# Patient Record
Sex: Female | Born: 1935 | Race: White | Hispanic: No | Marital: Married | Smoking: Former smoker
Health system: Southern US, Community
[De-identification: ages and names within clinical notes are randomized; demographics above are authoritative.]

## PROBLEM LIST (undated history)

## (undated) DIAGNOSIS — K52832 Lymphocytic colitis: Secondary | ICD-10-CM

## (undated) DIAGNOSIS — I341 Nonrheumatic mitral (valve) prolapse: Secondary | ICD-10-CM

## (undated) DIAGNOSIS — E785 Hyperlipidemia, unspecified: Secondary | ICD-10-CM

## (undated) DIAGNOSIS — M810 Age-related osteoporosis without current pathological fracture: Secondary | ICD-10-CM

## (undated) DIAGNOSIS — I10 Essential (primary) hypertension: Secondary | ICD-10-CM

## (undated) DIAGNOSIS — K5792 Diverticulitis of intestine, part unspecified, without perforation or abscess without bleeding: Secondary | ICD-10-CM

## (undated) DIAGNOSIS — K219 Gastro-esophageal reflux disease without esophagitis: Secondary | ICD-10-CM

## (undated) DIAGNOSIS — IMO0001 Reserved for inherently not codable concepts without codable children: Secondary | ICD-10-CM

## (undated) DIAGNOSIS — K449 Diaphragmatic hernia without obstruction or gangrene: Secondary | ICD-10-CM

## (undated) HISTORY — PX: OTHER SURGICAL HISTORY: SHX169

## (undated) HISTORY — PX: APPENDECTOMY: SHX54

## (undated) HISTORY — PX: CHOLECYSTECTOMY: SHX55

## (undated) HISTORY — DX: Age-related osteoporosis without current pathological fracture: M81.0

## (undated) HISTORY — DX: Diverticulitis of intestine, part unspecified, without perforation or abscess without bleeding: K57.92

## (undated) HISTORY — DX: Gastro-esophageal reflux disease without esophagitis: K21.9

## (undated) HISTORY — PX: CATARACT EXTRACTION: SUR2

## (undated) HISTORY — DX: Essential (primary) hypertension: I10

## (undated) HISTORY — DX: Lymphocytic colitis: K52.832

## (undated) HISTORY — PX: CORONARY STENT PLACEMENT: SHX1402

## (undated) HISTORY — DX: Reserved for inherently not codable concepts without codable children: IMO0001

## (undated) HISTORY — DX: Nonrheumatic mitral (valve) prolapse: I34.1

## (undated) HISTORY — DX: Hyperlipidemia, unspecified: E78.5

## (undated) HISTORY — PX: FOOT FRACTURE SURGERY: SHX645

## (undated) HISTORY — PX: CERVICAL DISC SURGERY: SHX588

## (undated) HISTORY — DX: Diaphragmatic hernia without obstruction or gangrene: K44.9

## (undated) HISTORY — PX: TUBAL LIGATION: SHX77

## (undated) HISTORY — PX: CARDIAC CATHETERIZATION: SHX172

## (undated) HISTORY — PX: TONSILLECTOMY: SUR1361

---

## 2004-03-23 ENCOUNTER — Ambulatory Visit: Payer: Self-pay | Admitting: Family Medicine

## 2005-03-28 ENCOUNTER — Ambulatory Visit: Payer: Self-pay | Admitting: Family Medicine

## 2005-04-05 ENCOUNTER — Ambulatory Visit: Payer: Self-pay | Admitting: Family Medicine

## 2005-08-13 ENCOUNTER — Emergency Department: Payer: Self-pay | Admitting: General Practice

## 2005-08-22 ENCOUNTER — Other Ambulatory Visit: Payer: Self-pay

## 2005-08-23 ENCOUNTER — Inpatient Hospital Stay: Payer: Self-pay | Admitting: Internal Medicine

## 2006-03-29 ENCOUNTER — Ambulatory Visit: Payer: Self-pay | Admitting: Family Medicine

## 2007-04-03 ENCOUNTER — Ambulatory Visit: Payer: Self-pay | Admitting: Family Medicine

## 2007-11-11 ENCOUNTER — Emergency Department: Payer: Self-pay | Admitting: Emergency Medicine

## 2007-11-11 ENCOUNTER — Other Ambulatory Visit: Payer: Self-pay

## 2007-11-14 ENCOUNTER — Ambulatory Visit: Payer: Self-pay | Admitting: Internal Medicine

## 2007-11-23 ENCOUNTER — Ambulatory Visit: Payer: Self-pay | Admitting: Family Medicine

## 2007-11-27 ENCOUNTER — Emergency Department: Payer: Self-pay | Admitting: Internal Medicine

## 2007-11-27 ENCOUNTER — Other Ambulatory Visit: Payer: Self-pay

## 2007-11-28 ENCOUNTER — Ambulatory Visit: Payer: Self-pay | Admitting: Internal Medicine

## 2008-04-03 ENCOUNTER — Ambulatory Visit: Payer: Self-pay | Admitting: Family Medicine

## 2009-04-06 ENCOUNTER — Ambulatory Visit: Payer: Self-pay | Admitting: Family Medicine

## 2009-10-24 ENCOUNTER — Emergency Department: Payer: Self-pay | Admitting: Emergency Medicine

## 2010-04-13 ENCOUNTER — Ambulatory Visit: Payer: Self-pay | Admitting: Family Medicine

## 2011-05-02 ENCOUNTER — Ambulatory Visit: Payer: Self-pay | Admitting: Family Medicine

## 2011-12-27 ENCOUNTER — Emergency Department: Payer: Self-pay | Admitting: Emergency Medicine

## 2011-12-27 LAB — URINALYSIS, COMPLETE
Glucose,UR: NEGATIVE mg/dL (ref 0–75)
Nitrite: NEGATIVE
Protein: NEGATIVE
RBC,UR: 4 /HPF (ref 0–5)
WBC UR: 13 /HPF (ref 0–5)

## 2011-12-27 LAB — COMPREHENSIVE METABOLIC PANEL
Albumin: 3.7 g/dL (ref 3.4–5.0)
Alkaline Phosphatase: 75 U/L (ref 50–136)
Anion Gap: 8 (ref 7–16)
BUN: 10 mg/dL (ref 7–18)
Calcium, Total: 9.2 mg/dL (ref 8.5–10.1)
Co2: 28 mmol/L (ref 21–32)
Creatinine: 0.93 mg/dL (ref 0.60–1.30)
EGFR (Non-African Amer.): >60
Glucose: 99 mg/dL (ref 65–99)
SGOT(AST): 30 U/L (ref 15–37)
SGPT (ALT): 29 U/L
Sodium: 141 mmol/L (ref 136–145)
Total Protein: 7.4 g/dL (ref 6.4–8.2)

## 2011-12-27 LAB — CBC
MCV: 98 fL (ref 80–100)
Platelet: 235 10*3/uL (ref 150–440)
RBC: 4.28 10*6/uL (ref 3.80–5.20)
WBC: 7.4 10*3/uL (ref 3.6–11.0)

## 2011-12-27 LAB — TROPONIN I: Troponin-I: <0.02 ng/mL

## 2011-12-29 LAB — URINE CULTURE

## 2012-02-12 ENCOUNTER — Inpatient Hospital Stay: Payer: Self-pay | Admitting: Internal Medicine

## 2012-02-12 LAB — CBC
HCT: 40.7 % (ref 35.0–47.0)
HGB: 13.7 g/dL (ref 12.0–16.0)
MCH: 32.1 pg (ref 26.0–34.0)
MCHC: 33.6 g/dL (ref 32.0–36.0)
MCV: 96 fL (ref 80–100)
Platelet: 266 10*3/uL (ref 150–440)
RBC: 4.26 10*6/uL (ref 3.80–5.20)

## 2012-02-12 LAB — BASIC METABOLIC PANEL
Calcium, Total: 9.7 mg/dL (ref 8.5–10.1)
Chloride: 104 mmol/L (ref 98–107)
EGFR (African American): >60
EGFR (Non-African Amer.): >60
Glucose: 122 mg/dL — ABNORMAL HIGH (ref 65–99)
Osmolality: 280 (ref 275–301)
Potassium: 3.9 mmol/L (ref 3.5–5.1)
Sodium: 139 mmol/L (ref 136–145)

## 2012-02-12 LAB — CK TOTAL AND CKMB (NOT AT ARMC)
CK, Total: 103 U/L (ref 21–215)
CK, Total: 85 U/L (ref 21–215)
CK-MB: 0.7 ng/mL (ref 0.5–3.6)
CK-MB: 1 ng/mL (ref 0.5–3.6)

## 2012-02-12 LAB — PRO B NATRIURETIC PEPTIDE: B-Type Natriuretic Peptide: 100 pg/mL (ref 0–125)

## 2012-02-12 LAB — TROPONIN I: Troponin-I: <0.02 ng/mL

## 2012-02-13 LAB — CK TOTAL AND CKMB (NOT AT ARMC): CK, Total: 78 U/L (ref 21–215)

## 2012-02-13 LAB — LIPID PANEL
HDL Cholesterol: 56 mg/dL (ref 40–60)
Ldl Cholesterol, Calc: 190 mg/dL — ABNORMAL HIGH (ref 0–100)
Triglycerides: 114 mg/dL (ref 0–200)
VLDL Cholesterol, Calc: 23 mg/dL (ref 5–40)

## 2012-02-13 LAB — CBC WITH DIFFERENTIAL/PLATELET
Basophil #: 0.1 10*3/uL (ref 0.0–0.1)
Basophil %: 1.1 %
Eosinophil #: 0.1 10*3/uL (ref 0.0–0.7)
Lymphocyte %: 40.5 %
MCH: 31.9 pg (ref 26.0–34.0)
MCHC: 33.4 g/dL (ref 32.0–36.0)
MCV: 96 fL (ref 80–100)
Monocyte #: 0.6 x10 3/mm (ref 0.2–0.9)
Monocyte %: 7.4 %
Neutrophil #: 3.8 10*3/uL (ref 1.4–6.5)
Neutrophil %: 49.8 %
RBC: 4.23 10*6/uL (ref 3.80–5.20)
RDW: 13.8 % (ref 11.5–14.5)

## 2012-02-13 LAB — BASIC METABOLIC PANEL
Calcium, Total: 8.9 mg/dL (ref 8.5–10.1)
Chloride: 109 mmol/L — ABNORMAL HIGH (ref 98–107)
Co2: 25 mmol/L (ref 21–32)
EGFR (African American): >60
EGFR (Non-African Amer.): >60
Potassium: 4.2 mmol/L (ref 3.5–5.1)
Sodium: 141 mmol/L (ref 136–145)

## 2012-02-13 LAB — TROPONIN I: Troponin-I: <0.02 ng/mL

## 2012-02-14 LAB — BASIC METABOLIC PANEL
Anion Gap: 6 — ABNORMAL LOW (ref 7–16)
BUN: 13 mg/dL (ref 7–18)
Co2: 26 mmol/L (ref 21–32)
Creatinine: 0.79 mg/dL (ref 0.60–1.30)
EGFR (African American): >60
EGFR (Non-African Amer.): >60
Potassium: 3.8 mmol/L (ref 3.5–5.1)
Sodium: 142 mmol/L (ref 136–145)

## 2012-02-14 LAB — CK TOTAL AND CKMB (NOT AT ARMC)
CK, Total: 150 U/L (ref 21–215)
CK-MB: 7.4 ng/mL — ABNORMAL HIGH (ref 0.5–3.6)

## 2012-05-09 ENCOUNTER — Ambulatory Visit: Payer: Self-pay | Admitting: Family Medicine

## 2013-04-03 ENCOUNTER — Ambulatory Visit (INDEPENDENT_AMBULATORY_CARE_PROVIDER_SITE_OTHER): Payer: Medicare Other | Admitting: Podiatry

## 2013-04-03 ENCOUNTER — Ambulatory Visit (INDEPENDENT_AMBULATORY_CARE_PROVIDER_SITE_OTHER): Payer: Medicare Other

## 2013-04-03 ENCOUNTER — Encounter: Payer: Self-pay | Admitting: Podiatry

## 2013-04-03 VITALS — BP 166/83 | HR 65 | Resp 16 | Ht 63.0 in | Wt 165.0 lb

## 2013-04-03 DIAGNOSIS — IMO0001 Reserved for inherently not codable concepts without codable children: Secondary | ICD-10-CM

## 2013-04-03 DIAGNOSIS — S92212D Displaced fracture of cuboid bone of left foot, subsequent encounter for fracture with routine healing: Secondary | ICD-10-CM

## 2013-04-03 NOTE — Progress Notes (Signed)
Jane [name] presents today for followup of her fractured cuboid left foot. She states that she wore her regular shoe because her foot feels a much better.  Objective: Vital signs are stable she is alert and oriented x3. She has no pain on palpation of the cuboid bone. Radiographic evaluation demonstrates a well-healed fractured cuboid.  Assessment: Well-healed cuboid fracture.  Plan: Followup with me on an as-needed basis.

## 2013-04-09 ENCOUNTER — Encounter: Payer: Self-pay | Admitting: Podiatry

## 2013-04-09 ENCOUNTER — Encounter: Payer: Self-pay | Admitting: *Deleted

## 2013-04-09 ENCOUNTER — Telehealth: Payer: Self-pay | Admitting: *Deleted

## 2013-04-09 NOTE — Telephone Encounter (Signed)
Corrections made to pt's medication and surgery history.

## 2013-04-11 ENCOUNTER — Other Ambulatory Visit: Payer: Self-pay

## 2013-05-10 ENCOUNTER — Ambulatory Visit: Payer: Self-pay | Admitting: Family Medicine

## 2013-05-23 ENCOUNTER — Ambulatory Visit: Payer: Self-pay | Admitting: Family Medicine

## 2014-05-12 ENCOUNTER — Ambulatory Visit: Payer: Self-pay | Admitting: Family Medicine

## 2014-06-12 ENCOUNTER — Ambulatory Visit: Payer: Self-pay | Admitting: Internal Medicine

## 2014-09-23 NOTE — Discharge Summary (Signed)
PATIENT NAME:  Jane [name], Jane [name] MR#:  [id number] DATE OF BIRTH:  Oct 26, 1935  DATE OF ADMISSION:  02/12/2012 DATE OF DISCHARGE:  02/14/2012  ADDENDUM:  I spoke with the patient about cholesterol medications. She said she tried all the statins on alternate day regimen and also Colestid regimen but none of the statins suited her and she said she will not take any statins and she will continue only Colestid and also Fish Oil. I've advised her just to continue those two.   Please discontinue the statins on previous discharge summary.   ____________________________ Katha HammingSnehalatha Patsye Sullivant, MD sk:drc D: 02/14/2012 09:15:06 ET T: 02/14/2012 14:27:18 ET JOB#: [id number]  cc: Katha HammingSnehalatha Buffi Ewton, MD, <Dictator> Katha HammingSNEHALATHA Florida Nolton MD ELECTRONICALLY SIGNED 02/21/2012 21:50

## 2014-09-23 NOTE — Consult Note (Signed)
PATIENT NAME:  Jane [name], Jane [name] MR#:  [id number] DATE OF BIRTH:  08-14-1935  DATE OF CONSULTATION:  02/13/2012  REFERRING PHYSICIAN:  Dr. Cyril LoosenKinner and Dr. Terance HartBronstein CONSULTING PHYSICIAN:  Tanaisha Pittman D. Juliann Paresallwood, MD  PRIMARY CARDIOLOGIST: Dr. Lenord CarboKristin Newby, [facility].   INDICATION: Unstable angina.   HISTORY OF PRESENT ILLNESS:  Ms. Jane [name] is [name] 79 year old white female with history of known coronary disease, mild to moderate, hypertension, hyperlipidemia, obesity, strong family history who states she has recently had symptoms of chest pain and angina. She has had trouble with her blood pressure, has been feeling weak and tired. She has had symptoms of chest pain, shortness of breath even at rest. This has gotten progressively worse, so she finally came to the Emergency Room for evaluation. Again, she has had significant chest pressure and heaviness, which has gotten progressively worse.   REVIEW OF SYSTEMS: No blackout spells or syncope. No nausea, vomiting, fever or chills. No weight gain. No hemoptysis or hematemesis. No bright red blood per rectum. No vision change or hearing changes. No significant sputum production or cough   PAST MEDICAL HISTORY:  1. Coronary artery disease.  2. Mitral valve prolapse.   3. Osteoarthritis.  4. Hiatal hernia.  5. Diverticulosis.  6. Hyperlipidemia.  7. Lymphocytic colitis.  8. Osteoporosis.  9. Degenerative joint disease.  10. Benign hypertension.   PAST SURGICAL HISTORY:  1. Appendectomy.  2. Cholecystectomy.    MEDICATIONS:  1. Vitamin D. 2. Quinapril 40. 3. Lexapro 10.  4. Prilosec 20. 5. Fish oil. 6. HCTZ 12.5 daily.  7. Endocet 10/650 q.6h. p.r.n.  8. Colestipol 1 gram b.i.d.  9. Atenolol 50 [name] day.  10. Multivitamin. 11. Aspirin 81 mg [name] day.  12. Xanax 0.25 q.12 hours p.r.n.   ALLERGIES: Hydrocodone.   SOCIAL HISTORY: Married, retired. No smoking or alcohol consumption.   FAMILY HISTORY: Diabetes, hypertension, coronary artery disease.    PHYSICAL EXAMINATION:  VITAL SIGNS: Blood pressure 190/80, pulse 70 and regular, respiratory rate 18, afebrile.   HEENT: Normocephalic, atraumatic. Pupils equal and reactive to light.   NECK: Supple. No significant jugular venous distention, bruit or adenopathy.   LUNGS: Clear to auscultation and percussion.   HEART: Positive S4.   ABDOMEN: Positive bowel sounds. No rebound, guarding, or tenderness.   EXTREMITIES: Within normal limits.   NEUROLOGIC: Intact.   SKIN: Normal.   LABORATORY, RADIOLOGICAL AND DIAGNOSTIC DATA: Troponin 0.02. CK 105, glucose 122, BUN 16, creatinine 0.91, sodium 139, potassium 3.9, hemoglobin 13.7, BUN 100. EKG: Normal sinus rhythm, nonspecific ST-T wave changes. CT of the chest was unremarkable.   ASSESSMENT:  1. Unstable angina.  2. Angina.  3. Chest pain.  4. Coronary artery disease.  5. Hypertension.  6. Hyperlipidemia.  7. Hyperglycemia.  8. Obesity.   PLAN:  1. Agree with admit.  2. Rule out for myocardial infarction.  3. Agree with telemetry.  4. Continue blood pressure control. 5. Recommended antianginal control.  6. Follow-up cardiac enzymes.  7. We will probably proceed directly with cardiac catheterization for full evaluation of cardiac anatomy. I am concerned that her symptoms are more worrisome for unstable angina.  8. Follow-up labs in the morning.  9. Consider nitroglycerin.  10. Consider beta blockers in the interim as well as ACE inhibitors for blood pressure control. Follow-up lipid evaluation.  11. Base further evaluation on the results of the catheterization.  12. Echocardiogram will be helpful as well.  ____________________________ Bobbie Stackwayne D. Juliann Paresallwood, MD ddc:ap D: 02/13/2012 20:44:34 ET T:  02/14/2012 09:41:16 ET JOB#: [id number] cc: Shannen Vernon D. Juliann Pares, MD, <Dictator> Alwyn Pea MD ELECTRONICALLY SIGNED 03/19/2012 12:58

## 2014-09-23 NOTE — Consult Note (Signed)
Brief Consult Note: Diagnosis: [place].   Recommend further assessment or treatment.   Orders entered.   Discussed with Attending MD.   Comments: IMP [place] Angina CP HTN . Plan Rec cardiac cath 02/13/12.  Electronic Signatures: Dorothyann Pengallwood, Riccardo Holeman D (MD)  (Signed 09-Sep-13 06:35)  Authored: Brief Consult Note   Last Updated: 09-Sep-13 06:35 by Alwyn Peaallwood, Khaliya Golinski D (MD)

## 2014-09-23 NOTE — Discharge Summary (Signed)
PATIENT NAME:  Jane [name], Jane [name] MR#:  [id number] DATE OF BIRTH:  11/19/1935  DATE OF ADMISSION:  02/12/2012 DATE OF DISCHARGE:  02/14/2012  PRIMARY CARE PHYSICIAN: Jane Basemanavid Bronstein, MD  CONSULTANT: Jane Pengwayne Callwood, MD - Cardiology.  DISCHARGE DIAGNOSES:  1. Chest pain secondary to unstable angina and also had intervention to the distal RCA with stenting.  2. Hypertension.  3. Hyperlipidemia.  4. Chronic lymphocytic colitis.   DISCHARGE MEDICATIONS:  1. Aspirin 81 mg daily.  2. Colestid 1 gram p.o. 6 tablets twice [name] day. 3. Fish oil one capsule p.o. twice [name] day. 4. Lexapro 10 mg daily.  5. Omeprazole 20 mg p.o. daily. 6. Vitamin D 1000 international units 2 tablets once [name] day. 7. Centrum Silver 1 tablet daily.  8. Tums four tablets once [name] day. 9. Endocet 10/650 mg 1/2 to 1 tablet every six hours as needed.  10. Xanax 0.5 mg every 12 hours. 11. Atenolol 12.5 mg once [name] day. The dose of atenolol has been decreased from 50 mg to 12.5 mg because of low blood pressure. 12. Quinapril - decreased from 20 mg twice daily to 10 mg daily because of low blood pressure.   NEW MEDICATIONS: 1. Plavix 75 mg daily. 2. Nitroglycerin sublingual as needed for chest pain.  3. Simvastatin 40 mg daily.   NOTE: The patient is advised to stop HCTZ because blood pressure is on the low side.   DISCHARGE CONDITION: Stable.   DIET: Low sodium, low fat diet.   CONSULTANTS: Jane Pengwayne Callwood, MD - Cardiology.  PROCEDURES:  1. Angiogram. 2. Cardiac catheterization.   HOSPITAL COURSE: The patient is [name] 79 year old female patient admitted on 02/12/2012 because of chest pain. The patient has family history of coronary artery disease. She came in with chest pain, heaviness, trouble breathing, nausea, and diaphoresis. The patient's pain radiated to the jaw as well. She got an EKG which was unremarkable. Troponins have been negative. The patient has multiple risk factors of family history of heart disease,  hypertension, and hyperlipidemia. The patient was admitted to telemetry for possible acute coronary syndrome. Troponins have been negative. She was seen by Dr. Juliann [name]. She was continued on aspirin, beta blockers, statins, and nitrates. The patient's troponins have been consistently negative x3. Because of concern for unstable angina, she was taken to cardiac catheterization which showed 95% stenosis in the distal RCA. The patient had [name] stent. She was monitored in the Critical Care Unit briefly and got some fluids and continued on aspirin and beta blockers along with Plavix and ACE inhibitors. Statin were started because her LDL is more than 100 (190) and her cholesterol is 269. The patient right now is seen in the Intensive Care Unit. Blood pressure is running low this morning at 95/69. The patient's atenolol dose and lisinopril dose are decreased and she is advised to stop HCTZ. We will monitor her for some more time until blood pressure comes up. Heart rate is also around 50 so I will decrease the dose of beta blockers. I spoke with Dr. Juliann [name] who said she can go off as she is better in terms of her blood pressure and new prescriptions for Plavix, aspirin, beta blockers, statins, ACE inhibitors, and nitrates were submitted to her pharmacy. The patient's WBC showed [name] hemoglobin of 13.3, hematocrit 40.4, and platelets are 234. Electrolytes are stable. Creatinine is 0.84. LDL is 190, cholesterol 216, and  triglycerides are 114. The patient's hemoglobin A1c is 5.7. The patient also had [name] CT of the  chest         in the ER because of chest pain or trouble breathing. It did not show any PE. Her condition is stable.   TIME SPENT ON DISCHARGE PREPARATION:  More than 30 minutes.  ____________________________ Jane Hamming, MD sk:slb D: 02/14/2012 09:09:24 ET T: 02/14/2012 14:22:03 ET JOB#: [id number]  cc: Jane Hamming, MD, <Dictator> Jane [name]. Jane Hart, MD Jane Hamming  MD ELECTRONICALLY SIGNED 02/21/2012 21:50

## 2014-09-23 NOTE — H&P (Signed)
PATIENT NAME:  Jane [name], Jane [name] MR#:  [id number] DATE OF BIRTH:  Jun 01, 1936  DATE OF ADMISSION:  02/12/2012  REFERRING PHYSICIAN: Dr. Cyril Loosen    FAMILY PHYSICIAN: Dr. Terance Hart   REASON FOR ADMISSION: Chest pain worrisome for unstable angina.   HISTORY OF PRESENT ILLNESS: The patient is a 79 year old female with a history of insignificant coronary artery disease by cardiac catheterization as well as hypertension, hyperlipidemia, and a positive family history of coronary artery disease who presents to the Emergency Room after a week history of intermittent chest pain described as heaviness and pressure radiating to the jaw associated with shortness of breath, nausea, and diaphoresis. Her symptoms are happening at rest. They last 30 to 40 minutes. Not relieved by food. Her pressure and tightness radiates to the jaw. She does have associated shortness of breath, nausea, and diaphoresis. She rates it an 8 out of 10 pain. She was brought to the Emergency Room today where her pain subsided after she was given morphine. EKG and initial enzymes were unremarkable. She is now admitted for further evaluation.   PAST MEDICAL HISTORY:  1. Insignificant coronary artery disease by cardiac catheterization in 2004.  2. Mitral valve prolapse.  3. Osteoarthritis. 4. Hiatal hernia.  5. Diverticulosis.  6. Hyperlipidemia.  7. History of lymphocytic colitis.  8. Osteoporosis.  9. Cervical degenerative disk disease.  10. Benign hypertension.  11. Status post cholecystectomy.  12. Status post appendectomy.   MEDICATIONS:  1. Vitamin D 2000 units p.o. daily.  2. Quinapril 40 mg p.o. daily.  3. Lexapro 10 mg p.o. daily.  4. Prilosec 20 mg p.o. daily. 5. Fish Oil 1 gram p.o. b.i.d.   6. Hydrochlorothiazide 12.5 mg p.o. daily.  7. Endocet 10/650 one p.o. q.6 hours p.r.n. pain.  8. Colestid 1 gram p.o. b.i.d.  9. Atenolol 50 mg p.o. daily.  10. Multivitamin 1 p.o. daily.  11. Aspirin 81 mg p.o. daily.   12. Xanax 0.25 mg p.o. q.12 hours p.r.n. anxiety.   ALLERGIES: Hydrocodone.   SOCIAL HISTORY: Negative for alcohol or tobacco abuse.   FAMILY HISTORY: Positive for diabetes, hypertension, coronary artery disease, and stroke.   REVIEW OF SYSTEMS: CONSTITUTIONAL: No fever or change in weight. EYES: No blurred or double vision. No glaucoma. ENT: No tinnitus or hearing loss. No nasal discharge or bleeding. No difficulty swallowing. RESPIRATORY: No cough or wheezing. No painful respiration. Denies hemoptysis. CARDIOVASCULAR: No orthopnea. Denies palpitations or syncope. GI: No vomiting or diarrhea. No abdominal pain. No change in bowel habits. GU: No dysuria or hematuria. No incontinence. ENDOCRINE: No polyuria or polydipsia. No heat or cold intolerance. HEMATOLOGIC: The patient denies anemia, easy bruising, or bleeding. LYMPHATIC: No swollen glands. MUSCULOSKELETAL: The patient denies pain in her neck, back, shoulders, knees, or hips. No gout. NEUROLOGIC: No numbness. Does have weakness. Denies migraines, stroke, or seizures. PSYCH: The patient denies anxiety, insomnia, or depression.   PHYSICAL EXAMINATION:   GENERAL: The patient is in no acute distress.   VITAL SIGNS: Vital signs are currently remarkable for a blood pressure of 189/80 with a heart rate of 66 and a respiratory rate of 29. She is afebrile.   HEENT: Normocephalic, atraumatic. Pupils appear to be round and reactive to light and accommodation. Extraocular movements are intact. Sclerae are nonicteric. Conjunctivae are clear. Oropharynx is clear.   NECK: Supple without JVD or bruits. No adenopathy or thyromegaly is noted.   LUNGS: Clear to auscultation and percussion without wheezes, rales, or rhonchi. No dullness.  CARDIAC: Regular rate and rhythm with normal S1, S2. No significant rubs, murmurs, or gallops. PMI is nondisplaced. Chest wall is nontender.   ABDOMEN: Soft, nontender with normoactive bowel sounds. No organomegaly or  masses were appreciated. No hernias or bruits were noted.   EXTREMITIES: Without clubbing, cyanosis, or edema. Pulses were 2+ bilaterally.   SKIN: Warm and dry without rash or lesions.   NEUROLOGIC: Cranial nerves II through XII grossly intact. Deep tendon reflexes were symmetric. Motor and sensory exams nonfocal.   PSYCH: Alert and oriented to person, place, and time. She was cooperative and used good judgment.   LABORATORY, DIAGNOSTIC, AND RADIOLOGICAL DATA: Troponin was less than 0.02 with a CK of 103 and an MB of 0.7. Glucose was 122 with a BUN of 16 and a creatinine of 0.91 with a sodium of 139 and a potassium of 3.9 and a GFR of greater than 60. White count was 8.3 with a hemoglobin of 13.7. BNP was 100.   EKG revealed sinus rhythm with no acute ischemic changes.   CT scan of the chest was negative for pulmonary embolism, infiltrate, or edema.   ASSESSMENT:  1. Chest pain worrisome for unstable angina.  2. Insignificant coronary artery disease by previous cardiac catheterization.  3. Benign hypertension.  4. Hyperlipidemia.  5. Hyperglycemia.  6. Family history of coronary artery disease.   PLAN:  1. The patient will be observed on telemetry with Lovenox, topical nitrates, and continue beta-blocker therapy.  2. Will follow serial cardiac enzymes and obtain an echocardiogram.  3. Will consult Cardiology for possible cardiac catheterization given the fact that her symptoms have occurred at rest.  4. Follow-up routine labs in the morning.  5. Further treatment and evaluation will depend upon the patient's progress.   TOTAL TIME SPENT ON THIS PATIENT: 50 minutes.   ____________________________ Duane LopeJeffrey D. Judithann SheenSparks, MD jds:drc D: 02/12/2012 15:19:28 ET T: 02/12/2012 15:31:29 ET JOB#: [id number]  cc: Duane LopeJeffrey D. Judithann SheenSparks, MD, <Dictator> Teena Iraniavid M. Terance HartBronstein, MD Terease Marcotte Rodena Medin Latorya Bautch MD ELECTRONICALLY SIGNED 02/12/2012 16:42

## 2014-12-01 ENCOUNTER — Other Ambulatory Visit: Payer: Self-pay

## 2015-02-26 ENCOUNTER — Other Ambulatory Visit: Payer: Self-pay | Admitting: Family Medicine

## 2015-02-26 DIAGNOSIS — Z1231 Encounter for screening mammogram for malignant neoplasm of breast: Secondary | ICD-10-CM

## 2015-05-15 ENCOUNTER — Ambulatory Visit
Admission: RE | Admit: 2015-05-15 | Discharge: 2015-05-15 | Disposition: A | Discharge status: Home / Self Care | Payer: Medicare Other | Source: Ambulatory Visit | Attending: Family Medicine | Admitting: Family Medicine

## 2015-05-15 DIAGNOSIS — Z1231 Encounter for screening mammogram for malignant neoplasm of breast: Secondary | ICD-10-CM

## 2015-10-22 ENCOUNTER — Encounter: Payer: Self-pay | Admitting: Emergency Medicine

## 2015-10-22 ENCOUNTER — Ambulatory Visit
Admission: EM | Admit: 2015-10-22 | Discharge: 2015-10-22 | Disposition: A | Discharge status: Home / Self Care | Payer: Medicare Other | Attending: Emergency Medicine | Admitting: Emergency Medicine

## 2015-10-22 ENCOUNTER — Ambulatory Visit (INDEPENDENT_AMBULATORY_CARE_PROVIDER_SITE_OTHER): Payer: Medicare Other

## 2015-10-22 DIAGNOSIS — L539 Erythematous condition, unspecified: Secondary | ICD-10-CM

## 2015-10-22 DIAGNOSIS — M79644 Pain in right finger(s): Secondary | ICD-10-CM

## 2015-10-22 LAB — BASIC METABOLIC PANEL
ANION GAP: 5 (ref 5–15)
BUN: 23 mg/dL — ABNORMAL HIGH (ref 6–20)
CHLORIDE: 107 mmol/L (ref 101–111)
CO2: 26 mmol/L (ref 22–32)
CREATININE: 0.88 mg/dL (ref 0.44–1.00)
Calcium: 9.3 mg/dL (ref 8.9–10.3)
GFR calc non Af Amer: >60 mL/min (ref >60)
Glucose, Bld: 143 mg/dL — ABNORMAL HIGH (ref 65–99)
POTASSIUM: 4.3 mmol/L (ref 3.5–5.1)
SODIUM: 138 mmol/L (ref 135–145)

## 2015-10-22 LAB — CBC WITH DIFFERENTIAL/PLATELET
BASOS ABS: 0.1 10*3/uL (ref 0–0.1)
EOS ABS: 0.1 10*3/uL (ref 0–0.7)
Eosinophils Relative: 2 %
HEMATOCRIT: 39.8 % (ref 35.0–47.0)
Hemoglobin: 13.4 g/dL (ref 12.0–16.0)
Lymphocytes Relative: 22 %
Lymphs Abs: 1.8 10*3/uL (ref 1.0–3.6)
MCH: 32 pg (ref 26.0–34.0)
MCHC: 33.6 g/dL (ref 32.0–36.0)
MCV: 95.4 fL (ref 80.0–100.0)
Monocytes Absolute: 0.8 10*3/uL (ref 0.2–0.9)
Monocytes Relative: 10 %
NEUTROS ABS: 5.2 10*3/uL (ref 1.4–6.5)
Platelets: 220 10*3/uL (ref 150–440)
RBC: 4.17 MIL/uL (ref 3.80–5.20)
RDW: 13.4 % (ref 11.5–14.5)
WBC: 8 10*3/uL (ref 3.6–11.0)

## 2015-10-22 LAB — URIC ACID: Uric Acid, Serum: 4.1 mg/dL (ref 2.3–6.6)

## 2015-10-22 NOTE — Discharge Instructions (Signed)
Do not have anything to eat or drink until you are evaluated by Dr. Merlyn LotKuzma. Go to the day surgery Center at [address] [place] for evaluation by him.

## 2015-10-22 NOTE — ED Provider Notes (Signed)
HPI  SUBJECTIVE:  Jane [name] is a right handed 80 y.o. female who presents with the acute onset of atraumatic right index finger swelling last night. States that it became swollen over approximately half an hour. She describes erythema, constant, throbbing pain, states that the finger feels hot. She has tried ice and elevation with no relief. There are no alleviating factors. Symptoms are worse with palpation and any movement. She denies any trauma, bite, cut, burn, numbness, tingling, other injury to the hand. She has never had symptoms like this before. Past medical history of coronary artery disease status post stent, on Plavix. TIA, hypertension, hypercholesterolemia, diabetes that does not require medications, ex-smoker, osteoarthritis, mitral valve prolapse. No history of atrial fibrillation, arrhythmia, DVT or PE, doubt. PMD: Dr. Terance Hart.  Last meal at 0800 this morning. Had approximately 2 ounces of cereal and half a cup of coffee.  Past Medical History  Diagnosis Date  . Lymphocytic colitis   . Diverticulitis   . Hiatal hernia   . Reflux   . Hyperlipidemia   . Hypertension   . Osteoporosis   . Mitral valve prolapse     Past Surgical History  Procedure Laterality Date  . Tonsillectomy    . Appendectomy    . Tubal ligation    . Hemangioma    . Cholecystectomy    . Cardiac catheterization    . Cervical disc surgery    . Cataract extraction    . Foot fracture surgery    . Coronary stent placement      Family History  Problem Relation Age of Onset  . Breast cancer Paternal Aunt     Social History  Substance Use Topics  . Smoking status: Former Games developer  . Smokeless tobacco: None  . Alcohol Use: No    No current facility-administered medications for this encounter.  Current outpatient prescriptions:  .  ALPRAZolam (XANAX) 0.5 MG tablet, Take 0.5 mg by mouth at bedtime as needed for sleep., Disp: , Rfl:  .  aspirin 81 MG tablet, Take 81 mg by mouth daily.,  Disp: , Rfl:  .  atenolol (TENORMIN) 25 MG tablet, Take by mouth daily., Disp: , Rfl:  .  Calcium Carbonate Antacid (TUMS E-X PO), Take by mouth., Disp: , Rfl:  .  Cholecalciferol (VITAMIN D PO), Take by mouth., Disp: , Rfl:  .  clopidogrel (PLAVIX) 75 MG tablet, Take 75 mg by mouth daily., Disp: , Rfl:  .  colestipol (COLESTID) 1 G tablet, Take 6 g by mouth 2 (two) times daily. , Disp: , Rfl:  .  denosumab (PROLIA) 60 MG/ML SOLN injection, Inject 60 mg into the skin every 6 (six) months. Administer in upper arm, thigh, or abdomen, Disp: , Rfl:  .  escitalopram (LEXAPRO) 10 MG tablet, Take 10 mg by mouth daily., Disp: , Rfl:  .  furosemide (LASIX) 20 MG tablet, Take 20 mg by mouth., Disp: , Rfl:  .  isosorbide dinitrate (ISORDIL) 30 MG tablet, Take 30 mg by mouth 2 (two) times daily. , Disp: , Rfl:  .  labetalol (NORMODYNE) 100 MG tablet, Take 100 mg by mouth 2 (two) times daily., Disp: , Rfl:  .  Multiple Vitamins-Minerals (CENTRUM PO), Take by mouth., Disp: , Rfl:  .  nitroGLYCERIN (NITROSTAT) 0.4 MG SL tablet, Place 0.4 mg under the tongue every 5 (five) minutes as needed for chest pain., Disp: , Rfl:  .  oxyCODONE-acetaminophen (ENDOCET) 10-325 MG per tablet, Take 1 tablet by mouth every  4 (four) hours as needed for pain., Disp: , Rfl:  .  quinapril (ACCUPRIL) 20 MG tablet, Take 20 mg by mouth at bedtime., Disp: , Rfl:   Allergies  Allergen Reactions  . Adhesive [Tape]   . Codeine      ROS  As noted in HPI.   Physical Exam  BP 145/67 mmHg  Pulse 73  Temp(Src) 98.6 F (37 C) (Oral)  Resp 18  SpO2 98%  Constitutional: Well developed, well nourished, no acute distress Eyes:  EOMI, conjunctiva normal bilaterally HENT: Normocephalic, atraumatic,mucus membranes moist Respiratory: Normal inspiratory effort Cardiovascular: Normal rate GI: nondistended skin: No rash, skin intact MSK: Right index finger held in flexion., swollen, erythematous. Pain with passive extension. Pain  with palpation of flexor tendons, pain with palpation of the PIP. Skin intact. No evidence of trauma. Erythema streaking up to the MP joint. 2. discrimination intact. Sensation and motor intact in the median/radial/ulnar distribution. See pictures.        Neurologic: Alert & oriented x 3, no focal neuro deficits Psychiatric: Speech and behavior appropriate   ED Course   Medications - No data to display  Orders Placed This Encounter  Procedures  . DG Finger Index Right    Standing Status: Standing     Number of Occurrences: 1     Standing Expiration Date:     Order Specific Question:  Reason for Exam (SYMPTOM  OR DIAGNOSIS REQUIRED)    Answer:  r/o fx dislocation  . CBC with Differential    Standing Status: Standing     Number of Occurrences: 1     Standing Expiration Date:   . Basic metabolic panel    Standing Status: Standing     Number of Occurrences: 1     Standing Expiration Date:     Results for orders placed or performed during the hospital encounter of 10/22/15 (from the past 24 hour(s))  CBC with Differential     Status: None   Collection Time: 10/22/15 11:06 AM  Result Value Ref Range   WBC 8.0 3.6 - 11.0 K/uL   RBC 4.17 3.80 - 5.20 MIL/uL   Hemoglobin 13.4 12.0 - 16.0 g/dL   HCT 53.6 64.4 - 03.4 %   MCV 95.4 80.0 - 100.0 fL   MCH 32.0 26.0 - 34.0 pg   MCHC 33.6 32.0 - 36.0 g/dL   RDW 74.2 59.5 - 63.8 %   Platelets 220 150 - 440 K/uL   Neutrophils Relative % 65% %   Neutro Abs 5.2 1.4 - 6.5 K/uL   Lymphocytes Relative 22% %   Lymphs Abs 1.8 1.0 - 3.6 K/uL   Monocytes Relative 10% %   Monocytes Absolute 0.8 0.2 - 0.9 K/uL   Eosinophils Relative 2% %   Eosinophils Absolute 0.1 0 - 0.7 K/uL   Basophils Relative 1% %   Basophils Absolute 0.1 0 - 0.1 K/uL  Basic metabolic panel     Status: Abnormal   Collection Time: 10/22/15 11:06 AM  Result Value Ref Range   Sodium 138 135 - 145 mmol/L   Potassium 4.3 3.5 - 5.1 mmol/L   Chloride 107 101 - 111  mmol/L   CO2 26 22 - 32 mmol/L   Glucose, Bld 143 (H) 65 - 99 mg/dL   BUN 23 (H) 6 - 20 mg/dL   Creatinine, Ser 7.56 0.44 - 1.00 mg/dL   Calcium 9.3 8.9 - 43.3 mg/dL   GFR calc non Af Amer >60 >60 mL/min  GFR calc Af Amer >60 >60 mL/min   Anion gap 5 5 - 15   No results found.  ED Clinical Impression  Pain of finger of right hand  Erythema of finger   ED Assessment/Plan  Concern for flexor tenosynovitis or a diffuse cellulitis. Does not appear to be an arterial/vascular issue. Doubt fracture. No evidence of paronychia. Also the differential would be an atypical presentation of gout,.   [place] Dr.[place]uz[place]. Discussed case with Larita FifeLynn, RN. Will make patient nothing by mouth, check x-ray, BMP, CBC. She is to go to the day surgery Center [address] [place].  Discussed MDM, plan and followup with patient.Patient agrees with plan.   *This clinic note was created using Dragon dictation software. Therefore, there may be occasional mistakes despite careful proofreading.  ?   Domenick GongAshley Selam Pietsch, MD 10/22/15 1137

## 2015-10-22 NOTE — ED Notes (Signed)
Pt with redness and swelling to right index finger started yesterday

## 2016-02-10 ENCOUNTER — Other Ambulatory Visit: Payer: Self-pay | Admitting: Family Medicine

## 2016-02-10 DIAGNOSIS — Z1239 Encounter for other screening for malignant neoplasm of breast: Secondary | ICD-10-CM

## 2016-05-17 ENCOUNTER — Ambulatory Visit
Admission: RE | Admit: 2016-05-17 | Discharge: 2016-05-17 | Disposition: A | Discharge status: Home / Self Care | Payer: Medicare Other | Source: Ambulatory Visit | Attending: Family Medicine | Admitting: Family Medicine

## 2016-05-17 DIAGNOSIS — Z1231 Encounter for screening mammogram for malignant neoplasm of breast: Secondary | ICD-10-CM | POA: Diagnosis present

## 2016-05-17 DIAGNOSIS — Z1239 Encounter for other screening for malignant neoplasm of breast: Secondary | ICD-10-CM

## 2016-11-25 ENCOUNTER — Emergency Department: Payer: Medicare HMO

## 2016-11-25 ENCOUNTER — Observation Stay
Admission: EM | Admit: 2016-11-25 | Discharge: 2016-11-28 | Disposition: A | Discharge status: Home / Self Care | Payer: Medicare HMO | Attending: Internal Medicine | Admitting: Internal Medicine

## 2016-11-25 ENCOUNTER — Encounter: Payer: Self-pay | Admitting: Emergency Medicine

## 2016-11-25 DIAGNOSIS — S329XXA Fracture of unspecified parts of lumbosacral spine and pelvis, initial encounter for closed fracture: Secondary | ICD-10-CM | POA: Diagnosis present

## 2016-11-25 DIAGNOSIS — Z7902 Long term (current) use of antithrombotics/antiplatelets: Secondary | ICD-10-CM | POA: Insufficient documentation

## 2016-11-25 DIAGNOSIS — M4316 Spondylolisthesis, lumbar region: Secondary | ICD-10-CM | POA: Insufficient documentation

## 2016-11-25 DIAGNOSIS — I2510 Atherosclerotic heart disease of native coronary artery without angina pectoris: Secondary | ICD-10-CM | POA: Diagnosis not present

## 2016-11-25 DIAGNOSIS — I10 Essential (primary) hypertension: Secondary | ICD-10-CM | POA: Diagnosis not present

## 2016-11-25 DIAGNOSIS — M810 Age-related osteoporosis without current pathological fracture: Secondary | ICD-10-CM | POA: Insufficient documentation

## 2016-11-25 DIAGNOSIS — Z79899 Other long term (current) drug therapy: Secondary | ICD-10-CM | POA: Insufficient documentation

## 2016-11-25 DIAGNOSIS — S3210XA Unspecified fracture of sacrum, initial encounter for closed fracture: Principal | ICD-10-CM | POA: Insufficient documentation

## 2016-11-25 DIAGNOSIS — I700 Atherosclerosis of aorta: Secondary | ICD-10-CM | POA: Insufficient documentation

## 2016-11-25 DIAGNOSIS — K219 Gastro-esophageal reflux disease without esophagitis: Secondary | ICD-10-CM | POA: Insufficient documentation

## 2016-11-25 DIAGNOSIS — E785 Hyperlipidemia, unspecified: Secondary | ICD-10-CM | POA: Diagnosis not present

## 2016-11-25 DIAGNOSIS — I341 Nonrheumatic mitral (valve) prolapse: Secondary | ICD-10-CM | POA: Insufficient documentation

## 2016-11-25 DIAGNOSIS — F329 Major depressive disorder, single episode, unspecified: Secondary | ICD-10-CM | POA: Diagnosis not present

## 2016-11-25 DIAGNOSIS — Z7982 Long term (current) use of aspirin: Secondary | ICD-10-CM | POA: Diagnosis not present

## 2016-11-25 DIAGNOSIS — S32511A Fracture of superior rim of right pubis, initial encounter for closed fracture: Secondary | ICD-10-CM

## 2016-11-25 DIAGNOSIS — M25551 Pain in right hip: Secondary | ICD-10-CM | POA: Diagnosis present

## 2016-11-25 DIAGNOSIS — K5730 Diverticulosis of large intestine without perforation or abscess without bleeding: Secondary | ICD-10-CM | POA: Diagnosis not present

## 2016-11-25 DIAGNOSIS — F419 Anxiety disorder, unspecified: Secondary | ICD-10-CM | POA: Diagnosis not present

## 2016-11-25 DIAGNOSIS — S32591A Other specified fracture of right pubis, initial encounter for closed fracture: Secondary | ICD-10-CM | POA: Insufficient documentation

## 2016-11-25 DIAGNOSIS — S32301A Unspecified fracture of right ilium, initial encounter for closed fracture: Secondary | ICD-10-CM

## 2016-11-25 DIAGNOSIS — S40811A Abrasion of right upper arm, initial encounter: Secondary | ICD-10-CM | POA: Insufficient documentation

## 2016-11-25 LAB — CK: Total CK: 197 U/L (ref 38–234)

## 2016-11-25 LAB — CBC WITH DIFFERENTIAL/PLATELET
BASOS ABS: 0.1 10*3/uL (ref 0–0.1)
BASOS PCT: 0 %
Eosinophils Absolute: 0.2 10*3/uL (ref 0–0.7)
Eosinophils Relative: 1 %
HEMATOCRIT: 37.8 % (ref 35.0–47.0)
HEMOGLOBIN: 12.7 g/dL (ref 12.0–16.0)
Lymphocytes Relative: 13 %
Lymphs Abs: 1.5 10*3/uL (ref 1.0–3.6)
MCH: 32.3 pg (ref 26.0–34.0)
MCHC: 33.7 g/dL (ref 32.0–36.0)
MCV: 96 fL (ref 80.0–100.0)
Monocytes Absolute: 0.8 10*3/uL (ref 0.2–0.9)
Monocytes Relative: 7 %
NEUTROS ABS: 9.3 10*3/uL — AB (ref 1.4–6.5)
NEUTROS PCT: 79 %
Platelets: 235 10*3/uL (ref 150–440)
RBC: 3.94 MIL/uL (ref 3.80–5.20)
RDW: 13.5 % (ref 11.5–14.5)
WBC: 11.8 10*3/uL — ABNORMAL HIGH (ref 3.6–11.0)

## 2016-11-25 LAB — BASIC METABOLIC PANEL
ANION GAP: 6 (ref 5–15)
BUN: 21 mg/dL — ABNORMAL HIGH (ref 6–20)
CALCIUM: 9.6 mg/dL (ref 8.9–10.3)
CO2: 23 mmol/L (ref 22–32)
Chloride: 105 mmol/L (ref 101–111)
Creatinine, Ser: 0.9 mg/dL (ref 0.44–1.00)
GFR, EST NON AFRICAN AMERICAN: 59 mL/min — AB (ref >60)
GLUCOSE: 159 mg/dL — AB (ref 65–99)
POTASSIUM: 4.1 mmol/L (ref 3.5–5.1)
Sodium: 134 mmol/L — ABNORMAL LOW (ref 135–145)

## 2016-11-25 MED ORDER — OXYCODONE-ACETAMINOPHEN 5-325 MG PO TABS
1.0000 | ORAL_TABLET | ORAL | Status: DC | PRN
  Administered 2016-11-25 – 2016-11-28 (×5): 1 via ORAL
  Filled 2016-11-25 (×6): qty 1

## 2016-11-25 MED ORDER — MORPHINE SULFATE (PF) 4 MG/ML IV SOLN
INTRAVENOUS | Status: AC
  Administered 2016-11-25: 4 mg via INTRAVENOUS
  Filled 2016-11-25: qty 1

## 2016-11-25 MED ORDER — VITAMIN D 1000 UNITS PO TABS
2000.0000 [IU] | ORAL_TABLET | Freq: Every day | ORAL | Status: DC
  Administered 2016-11-26 – 2016-11-28 (×3): 2000 [IU] via ORAL
  Filled 2016-11-25 (×3): qty 2

## 2016-11-25 MED ORDER — NITROGLYCERIN 0.4 MG SL SUBL: 0.4000 mg | SUBLINGUAL_TABLET | SUBLINGUAL | Status: DC | PRN

## 2016-11-25 MED ORDER — ONDANSETRON HCL 4 MG PO TABS: 4.0000 mg | ORAL_TABLET | Freq: Four times a day (QID) | ORAL | Status: DC | PRN

## 2016-11-25 MED ORDER — TETANUS-DIPHTH-ACELL PERTUSSIS 5-2.5-18.5 LF-MCG/0.5 IM SUSP
0.5000 mL | Freq: Once | INTRAMUSCULAR | Status: AC
Start: 2016-11-25 — End: 2016-11-25
  Administered 2016-11-25: 0.5 mL via INTRAMUSCULAR
  Filled 2016-11-25: qty 0.5

## 2016-11-25 MED ORDER — ALPRAZOLAM 0.5 MG PO TABS
0.5000 mg | ORAL_TABLET | Freq: Every evening | ORAL | Status: DC | PRN
  Administered 2016-11-27: 0.5 mg via ORAL
  Filled 2016-11-25: qty 1

## 2016-11-25 MED ORDER — MORPHINE SULFATE (PF) 4 MG/ML IV SOLN
4.0000 mg | Freq: Once | INTRAVENOUS | Status: AC
  Administered 2016-11-25: 4 mg via INTRAVENOUS

## 2016-11-25 MED ORDER — ASPIRIN EC 81 MG PO TBEC
81.0000 mg | DELAYED_RELEASE_TABLET | Freq: Every day | ORAL | Status: DC
Start: 2016-11-26 — End: 2016-11-28
  Administered 2016-11-26 – 2016-11-28 (×3): 81 mg via ORAL
  Filled 2016-11-25 (×3): qty 1

## 2016-11-25 MED ORDER — ATENOLOL 25 MG PO TABS
50.0000 mg | ORAL_TABLET | Freq: Every day | ORAL | Status: DC
  Administered 2016-11-26 – 2016-11-28 (×3): 50 mg via ORAL
  Filled 2016-11-25 (×3): qty 2

## 2016-11-25 MED ORDER — MORPHINE SULFATE (PF) 2 MG/ML IV SOLN
2.0000 mg | INTRAVENOUS | Status: DC | PRN
  Administered 2016-11-27 (×2): 2 mg via INTRAVENOUS
  Filled 2016-11-25 (×2): qty 1

## 2016-11-25 MED ORDER — ONDANSETRON HCL 4 MG/2ML IJ SOLN: 4.0000 mg | Freq: Four times a day (QID) | INTRAMUSCULAR | Status: DC | PRN

## 2016-11-25 MED ORDER — ACETAMINOPHEN 650 MG RE SUPP: 650.0000 mg | Freq: Four times a day (QID) | RECTAL | Status: DC | PRN

## 2016-11-25 MED ORDER — ISOSORBIDE DINITRATE 30 MG PO TABS
30.0000 mg | ORAL_TABLET | Freq: Two times a day (BID) | ORAL | Status: DC
  Administered 2016-11-26 – 2016-11-28 (×5): 30 mg via ORAL
  Filled 2016-11-25 (×7): qty 1

## 2016-11-25 MED ORDER — HYDROCODONE-ACETAMINOPHEN 5-325 MG PO TABS
1.0000 | ORAL_TABLET | ORAL | Status: DC | PRN
Start: 2016-11-25 — End: 2016-11-25
  Filled 2016-11-25: qty 1

## 2016-11-25 MED ORDER — DIPHENHYDRAMINE HCL 50 MG/ML IJ SOLN
12.5000 mg | Freq: Once | INTRAMUSCULAR | Status: AC
  Administered 2016-11-25: 12.5 mg via INTRAVENOUS
  Filled 2016-11-25: qty 1

## 2016-11-25 MED ORDER — ESCITALOPRAM OXALATE 10 MG PO TABS
10.0000 mg | ORAL_TABLET | Freq: Every day | ORAL | Status: DC
  Administered 2016-11-26 – 2016-11-28 (×3): 10 mg via ORAL
  Filled 2016-11-25 (×3): qty 1

## 2016-11-25 MED ORDER — FUROSEMIDE 20 MG PO TABS: 20.0000 mg | ORAL_TABLET | Freq: Every day | ORAL | Status: DC | PRN

## 2016-11-25 MED ORDER — ENOXAPARIN SODIUM 40 MG/0.4ML ~~LOC~~ SOLN
40.0000 mg | SUBCUTANEOUS | Status: DC
  Administered 2016-11-25 – 2016-11-27 (×3): 40 mg via SUBCUTANEOUS
  Filled 2016-11-25 (×3): qty 0.4

## 2016-11-25 MED ORDER — MORPHINE SULFATE (PF) 2 MG/ML IV SOLN
2.0000 mg | Freq: Once | INTRAVENOUS | Status: AC
  Administered 2016-11-25: 2 mg via INTRAVENOUS
  Filled 2016-11-25: qty 1

## 2016-11-25 MED ORDER — ONDANSETRON HCL 4 MG/2ML IJ SOLN
4.0000 mg | Freq: Once | INTRAMUSCULAR | Status: AC
Start: 2016-11-25 — End: 2016-11-25
  Administered 2016-11-25: 4 mg via INTRAVENOUS
  Filled 2016-11-25: qty 2

## 2016-11-25 MED ORDER — ACETAMINOPHEN 325 MG PO TABS
650.0000 mg | ORAL_TABLET | Freq: Four times a day (QID) | ORAL | Status: DC | PRN
  Administered 2016-11-27 – 2016-11-28 (×3): 650 mg via ORAL
  Filled 2016-11-25 (×4): qty 2

## 2016-11-25 MED ORDER — SODIUM CHLORIDE 0.9 % IV BOLUS (SEPSIS)
1000.0000 mL | Freq: Once | INTRAVENOUS | Status: AC
  Administered 2016-11-25: 1000 mL via INTRAVENOUS

## 2016-11-25 MED ORDER — LABETALOL HCL 100 MG PO TABS
100.0000 mg | ORAL_TABLET | Freq: Two times a day (BID) | ORAL | Status: DC
  Administered 2016-11-26 – 2016-11-28 (×5): 100 mg via ORAL
  Filled 2016-11-25 (×7): qty 1

## 2016-11-25 MED ORDER — CLOPIDOGREL BISULFATE 75 MG PO TABS
75.0000 mg | ORAL_TABLET | Freq: Every day | ORAL | Status: DC
  Administered 2016-11-26 – 2016-11-28 (×3): 75 mg via ORAL
  Filled 2016-11-25 (×3): qty 1

## 2016-11-25 MED ORDER — QUINAPRIL HCL 10 MG PO TABS
20.0000 mg | ORAL_TABLET | Freq: Every day | ORAL | Status: DC
  Administered 2016-11-26 – 2016-11-28 (×3): 20 mg via ORAL
  Filled 2016-11-25 (×3): qty 2

## 2016-11-25 NOTE — ED Notes (Signed)
Hospitalist at bedside 

## 2016-11-25 NOTE — H&P (Signed)
[facility] [place] [facility] [place]   PATIENT NAME: Jane [name]    MR#:  [id number]  DATE OF BIRTH:  [birth year]  DATE OF ADMISSION:  11/25/2016  PRIMARY CARE PHYSICIAN: Dorothey Baseman, MD   REQUESTING/REFERRING PHYSICIAN: Dr. Governor Rooks  CHIEF COMPLAINT:   Chief Complaint  Patient presents with  . Fall  . Hip Pain    HISTORY OF PRESENT ILLNESS:  Jane [name]  is a 81 y.o. female with a known history of Hypertension, hyperlipidemia, mitral valve prolapse, diverticulitis, osteoporosis, GERD, history of bilateral hernia presents to the hospital after mechanical fall and noted to have right hip/pelvic pain. Patient says she had a mechanical fall in her backyard yesterday she was looking at some of her birds. She fell to the ground was lying there for 2 hours until her husband has arrived home.  Patient could not bear any weight on her right side and therefore was brought to the ER for further evaluation. Patient underwent CT of her pelvis which showed a pelvic/sacral fracture. Status post services were contacted further treatment and evaluation. Patient denies any prodromal symptoms prior to her fall including dizziness, chest pain, shortness of breath, nausea vomiting or any other associated symptoms.  PAST MEDICAL HISTORY:   Past Medical History:  Diagnosis Date  . Diverticulitis   . Hiatal hernia   . Hyperlipidemia   . Hypertension   . Lymphocytic colitis   . Mitral valve prolapse   . Osteoporosis   . Reflux     PAST SURGICAL HISTORY:   Past Surgical History:  Procedure Laterality Date  . APPENDECTOMY    . CARDIAC CATHETERIZATION    . CATARACT EXTRACTION    . CERVICAL DISC SURGERY    . CHOLECYSTECTOMY    . CORONARY STENT PLACEMENT    . FOOT FRACTURE SURGERY    . hemangioma    . TONSILLECTOMY    . TUBAL LIGATION      SOCIAL HISTORY:   Social History  Substance Use Topics  . Smoking status: Former Smoker    Types: Cigarettes  . Smokeless  tobacco: Never Used  . Alcohol use No    FAMILY HISTORY:   Family History  Problem Relation Age of Onset  . Breast cancer Paternal Aunt   . Diabetes Mother   . Hypertension Mother   . Heart attack Mother     DRUG ALLERGIES:   Allergies  Allergen Reactions  . Adhesive [Tape]   . Codeine     REVIEW OF SYSTEMS:   Review of Systems  Constitutional: Negative for fever and weight loss.  HENT: Negative for congestion, nosebleeds and tinnitus.   Eyes: Negative for blurred vision, double vision and redness.  Respiratory: Negative for cough, hemoptysis and shortness of breath.   Cardiovascular: Negative for chest pain, orthopnea, leg swelling and PND.  Gastrointestinal: Negative for abdominal pain, diarrhea, melena, nausea and vomiting.  Genitourinary: Negative for dysuria, hematuria and urgency.  Musculoskeletal: Positive for falls and joint pain (Right hip, pelvic pain. ).  Neurological: Negative for dizziness, tingling, sensory change, focal weakness, seizures, weakness and headaches.  Endo/Heme/Allergies: Negative for polydipsia. Does not bruise/bleed easily.  Psychiatric/Behavioral: Negative for depression and memory loss. The patient is not nervous/anxious.     MEDICATIONS AT HOME:   Prior to Admission medications   Medication Sig Start Date End Date Taking? Authorizing Provider  acetaminophen (TYLENOL) 500 MG tablet Take 500 mg by mouth every 6 (six) hours as needed.   Yes [provider]  ALPRAZolam (XANAX) 0.5 MG tablet Take 0.5 mg by mouth at bedtime as needed for sleep.   Yes [provider]  aspirin 81 MG tablet Take 81 mg by mouth daily.   Yes [provider]  atenolol (TENORMIN) 50 MG tablet Take 50 mg by mouth daily. 09/30/16  Yes [provider]  Calcium Carbonate Antacid (TUMS E-X PO) Take by mouth.   Yes [provider]  cholecalciferol (VITAMIN D) 1000 units tablet Take 2,000 Units by mouth daily.    Yes [provider]  clopidogrel (PLAVIX) 75 MG tablet Take 75 mg by mouth daily.   Yes [provider]  escitalopram (LEXAPRO) 10 MG tablet Take 10 mg by mouth daily.   Yes [provider]  furosemide (LASIX) 20 MG tablet Take 20 mg by mouth daily as needed.    Yes [provider]  isosorbide dinitrate (ISORDIL) 30 MG tablet Take 30 mg by mouth 2 (two) times daily.    Yes [provider]  labetalol (NORMODYNE) 100 MG tablet Take 100 mg by mouth 2 (two) times daily.   Yes [provider]  Multiple Vitamins-Minerals (CENTRUM PO) Take 1 tablet by mouth daily.    Yes [provider]  nitroGLYCERIN (NITROSTAT) 0.4 MG SL tablet Place 0.4 mg under the tongue every 5 (five) minutes as needed for chest pain.   Yes [provider]  quinapril (ACCUPRIL) 20 MG tablet Take 20 mg by mouth daily.    Yes [provider]      VITAL SIGNS:  Blood pressure 133/63, pulse 63, temperature 98.7 F (37.1 C), temperature source Oral, resp. rate 20, height 5\' 3"  (1.6 m), weight 81.6 kg (180 lb), SpO2 96 %.  PHYSICAL EXAMINATION:  Physical Exam  GENERAL:  81 y.o.-year-old patient lying in bed in no acute distress.  EYES: Pupils equal, round, reactive to light and accommodation. No scleral icterus. Extraocular muscles intact.  HEENT: Head atraumatic, normocephalic. Oropharynx and nasopharynx clear. No oropharyngeal erythema, moist oral mucosa  NECK:  Supple, no jugular venous distention. No thyroid enlargement, no tenderness.  LUNGS: Normal breath sounds bilaterally, no wheezing, rales, rhonchi. No use of accessory muscles of respiration.  CARDIOVASCULAR: S1, S2 RRR. No murmurs, rubs, gallops, clicks.  ABDOMEN: Soft, nontender, nondistended. Bowel sounds present. No organomegaly or mass.  EXTREMITIES: No pedal edema, cyanosis, or clubbing. + 2 pedal & radial pulses b/l.   NEUROLOGIC: Cranial nerves II through XII are intact. No focal Motor or sensory  deficits appreciated b/l. Globally weak.  PSYCHIATRIC: The patient is alert and oriented x 3.  SKIN: No obvious rash, lesion, or ulcer.   LABORATORY PANEL:   CBC  Recent Labs Lab 11/25/16 1253  WBC 11.8*  HGB 12.7  HCT 37.8  PLT 235   ------------------------------------------------------------------------------------------------------------------  Chemistries   Recent Labs Lab 11/25/16 1253  NA 134*  K 4.1  CL 105  CO2 23  GLUCOSE 159*  BUN 21*  CREATININE 0.90  CALCIUM 9.6   ------------------------------------------------------------------------------------------------------------------  Cardiac Enzymes No results for input(s): TROPONINI in the last 168 hours. ------------------------------------------------------------------------------------------------------------------  RADIOLOGY:  Dg Pelvis 1-2 Views  Result Date: 11/25/2016 CLINICAL DATA:  Larey Seat today patient is in a lot of pain mostly on her right side of her buttocks to her hip. No previous EXAM: PELVIS - 1-2 VIEW COMPARISON:  None. FINDINGS: There are right hemipelvis fractures. There is a fracture across the right superior pubis from the superior pubic symphysis to the  superior margin of the obturator ring. There is a transverse fracture of the inferior pubic ramus. These fractures are not significantly displaced. There is a probable sagittal oriented fracture across the right sacrum adjacent to the right SI joint. No other fractures. Hip joints and SI joints are normally aligned as is the symphysis pubis. Bones are demineralized. Soft tissues are unremarkable. IMPRESSION: 1. Nondisplaced fractures of the right superior inferior pubic rami. 2. Probable nondisplaced fracture of the right sacrum adjacent to the right SI joint. Electronically Signed   By: Amie Portlandavid  Ormond M.D.   On: 11/25/2016 13:45   Ct Lumbar Spine Wo Contrast  Result Date: 11/25/2016 CLINICAL DATA:  81 y/o  F; fall with back pain. EXAM: CT LUMBAR  SPINE WITHOUT CONTRAST TECHNIQUE: Multidetector CT imaging of the lumbar spine was performed without intravenous contrast administration. Multiplanar CT image reconstructions were also generated. COMPARISON:  11/25/2016 pelvic radiographs. 11/25/2016 concurrent pelvic CT. FINDINGS: Segmentation: 5 lumbar type vertebrae. Alignment: Minimal grade 1 anterolisthesis at L5-S1. Vertebrae: No acute fracture or focal pathologic process. Paraspinal and other soft tissues: Extensive calcific atherosclerosis of the abdominal aorta. Disc levels: Mild loss of the L5-S1 intervertebral disc space and moderate lower lumbar facet arthrosis. IMPRESSION: 1. No acute fracture or dislocation of the lumbar spine identified. 2. Minimal grade 1 L5-S1 anterolisthesis and loss of disc space height. Prominent lower lumbar facet arthrosis. 3. Please refer to concurrent pelvic CT for further evaluation of the bony pelvis. Electronically Signed   By: Mitzi HansenLance  Furusawa-Stratton M.D.   On: 11/25/2016 15:13   Ct Pelvis Wo Contrast  Result Date: 11/25/2016 CLINICAL DATA:  Fall this morning.  Back and pelvic pain. EXAM: CT PELVIS WITHOUT CONTRAST TECHNIQUE: Multidetector CT imaging of the pelvis was performed following the standard protocol without intravenous contrast. COMPARISON:  Current pelvis radiographs. FINDINGS: Urinary Tract: Bladder is unremarkable. There is mild stranding in the fat along the inferior margin of the bladder. Bowel:  Sigmoid colon diverticulosis.  No evidence of bowel injury. Vascular/Lymphatic: Aortic and iliac artery atherosclerotic calcifications. No aneurysm or evidence of a vascular injury. Reproductive:  Unremarkable. Other: There is significant atrophy of the right rectus abdominus muscle. No abdominal wall hernia. No pelvic free fluid. Musculoskeletal: As noted on current radiographs, there is a fracture of the right pubis extending to the superior pubic symphysis. Fracture is mildly comminuted, without significant  displacement. There is a non comminuted nondisplaced transverse fracture of the inferior right pubic ramus. The fracture the right sacrum suggested on the radiographs remains not definitive. Small buckle of the anterior cortical margin of the right sacrum adjacent to the mid to inferior right SI joint suggests a nondisplaced fracture. There is a more definitive fracture line along the superior margin of the right iliac crest just lateral to the SI joint. No other evidence of fractures. No bone lesions. There are degenerative changes of the lower lumbar spine. Bones are demineralized. IMPRESSION: 1. Nondisplaced fractures of the right superior and inferior pubic rami as described. 2. Probable fracture along the right anterior sacrum, not well-defined by CT or radiographs. Subtle nondisplaced fracture along the medial superior right iliac crest. No other fractures. No dislocation. Electronically Signed   By: Amie Portlandavid  Ormond M.D.   On: 11/25/2016 15:15     IMPRESSION AND PLAN:   81 year old female with past medical history of gastroesophageal reflux disease, osteoporosis, mitral prolapse, hypertension, hyperlipidemia, history of diverticulitis of presents to the hospital after mechanical fall and noted to have a pelvic/sacral fracture.  1. Status post fall with pelvic and sacral fracture-this is secondary to a mechanical fall. -We'll get orthopedic consult, this is likely nonoperable. Supportive care with pain control and we'll get physical therapy evaluation.  2. Essential hypertension-continue atenolol, labetalol, Imdur, Quinapril.  3. Depression-continue Lexapro.  4. Anxiety - cont. Xanax.   5. Hx of CAD - no acute chest pain.  - cont. ASA, Plavix, B-blocker, Imdur.    All the records are reviewed and case discussed with ED provider. Management plans discussed with the patient, family and they are in agreement.  CODE STATUS: Full code  TOTAL TIME TAKING CARE OF THIS PATIENT: 45 minutes.     Houston Siren M.D on 11/25/2016 at 4:24 PM  Between 7am to 6pm - Pager - [phone]  After 6pm go to [email or web address] - password [email or web address] [facility]  [facility]  Office  [phone]  CC: Primary care physician; Dorothey Baseman, MD

## 2016-11-25 NOTE — Progress Notes (Signed)
Nurse unable to assess skin on back or start sacral pad protocol d/t patient's extreme pain. Advised oncoming shift of this.

## 2016-11-25 NOTE — ED Notes (Signed)
This tech placed a BARD PUREWICK Female External Catheter on pt

## 2016-11-25 NOTE — ED Notes (Signed)
Patient BP trend appears to be softening, asking MD for orders for fluid.

## 2016-11-25 NOTE — ED Notes (Signed)
Patient transported to X-ray 

## 2016-11-25 NOTE — Progress Notes (Signed)
MD paged and question as to whether BP meds should be given this evening. Order to hold meds received

## 2016-11-25 NOTE — ED Triage Notes (Signed)
Patient comes in from home with unwitnessed fall. Patient states she laid outside in the sun for 2 hours on the ground. Patient reports right hip pain. Per EMS patient cannot move it or bare weight. Per EMS no deformity, pulses intact. Patient has history of falls, spinal fusions and chronic back pain.

## 2016-11-25 NOTE — ED Provider Notes (Signed)
[facility] Emergency Department Provider Note ____________________________________________   I have reviewed the triage vital signs and the triage nursing note.  HISTORY  Chief Complaint Fall and Hip Pain   Historian Patient  HPI Jane [name] is a 81 y.o. female with a history of osteoporosis who states that she has been having some chronic sciatica-type pains down the right side of her leg especially overnight last night and when she was outside feeding her blue birds in the backyard she had her right leg give out landing on her right hip and it is now so painful that she could not get up. She went outside for 2 hours until her husband at home.  She came in by EMS that she is still unable to bear weight on the right hip. Pain is moderate to severe. Denies head injury, neck injury, chest pain or trouble breathing.     Past Medical History:  Diagnosis Date  . Diverticulitis   . Hiatal hernia   . Hyperlipidemia   . Hypertension   . Lymphocytic colitis   . Mitral valve prolapse   . Osteoporosis   . Reflux     There are no active problems to display for this patient.   Past Surgical History:  Procedure Laterality Date  . APPENDECTOMY    . CARDIAC CATHETERIZATION    . CATARACT EXTRACTION    . CERVICAL DISC SURGERY    . CHOLECYSTECTOMY    . CORONARY STENT PLACEMENT    . FOOT FRACTURE SURGERY    . hemangioma    . TONSILLECTOMY    . TUBAL LIGATION      Prior to Admission medications   Medication Sig Start Date End Date Taking? Authorizing Provider  acetaminophen (TYLENOL) 500 MG tablet Take 500 mg by mouth every 6 (six) hours as needed.   Yes [provider]  ALPRAZolam Prudy Feeler(XANAX) 0.5 MG tablet Take 0.5 mg by mouth at bedtime as needed for sleep.   Yes [provider]  aspirin 81 MG tablet Take 81 mg by mouth daily.   Yes [provider]  atenolol (TENORMIN) 50 MG tablet Take 50 mg by mouth daily. 09/30/16  Yes [provider]  Calcium Carbonate Antacid (TUMS E-X PO) Take by mouth.   Yes [provider]  cholecalciferol (VITAMIN D) 1000 units tablet Take 2,000 Units by mouth daily.    Yes [provider]  clopidogrel (PLAVIX) 75 MG tablet Take 75 mg by mouth daily.   Yes [provider]  escitalopram (LEXAPRO) 10 MG tablet Take 10 mg by mouth daily.   Yes [provider]  furosemide (LASIX) 20 MG tablet Take 20 mg by mouth daily as needed.    Yes [provider]  isosorbide dinitrate (ISORDIL) 30 MG tablet Take 30 mg by mouth 2 (two) times daily.    Yes [provider]  labetalol (NORMODYNE) 100 MG tablet Take 100 mg by mouth 2 (two) times daily.   Yes [provider]  Multiple Vitamins-Minerals (CENTRUM PO) Take 1 tablet by mouth daily.    Yes [provider]  nitroGLYCERIN (NITROSTAT) 0.4 MG SL tablet Place 0.4 mg under the tongue every 5 (five) minutes as needed for chest pain.   Yes [provider]  quinapril (ACCUPRIL) 20 MG tablet Take 20 mg by mouth daily.    Yes [provider]    Allergies  Allergen Reactions  . Adhesive [Tape]   . Codeine   Morphine  Often causes her to itch but states that she can tolerate it with Benadryl. Fentanyl gives her psychosis. Tolerated oxycodone for several years.  Family History  Problem Relation Age of Onset  . Breast cancer Paternal Aunt     Social History Social History  Substance Use Topics  . Smoking status: Former Games developer  . Smokeless tobacco: Never Used  . Alcohol use No    Review of Systems  Constitutional: Negative for fever. Eyes: Negative for visual changes. ENT: Negative for sore throat. Cardiovascular: Negative for chest pain. Respiratory: Negative for shortness of breath. Gastrointestinal: Negative for abdominal pain, vomiting and diarrhea. Genitourinary: Negative for dysuria. Musculoskeletal: Positive low back pain. Positive right hip  pain. Skin: Negative for rash. Neurological: Negative for headache.  ____________________________________________   PHYSICAL EXAM:  VITAL SIGNS: ED Triage Vitals [11/25/16 1241]  Enc Vitals Group     BP (!) 142/63     Pulse Rate 64     Resp 18     Temp 98.7 F (37.1 C)     Temp Source Oral     SpO2 97 %     Weight 180 lb (81.6 kg)     Height 5\' 3"  (1.6 m)     Head Circumference      Peak Flow      Pain Score 10     Pain Loc      Pain Edu?      Excl. in GC?      Constitutional: Alert and oriented. Well appearing and in no distress. HEENT   Head: Normocephalic and atraumatic.      Eyes: Conjunctivae are normal. Pupils equal and round.       Ears:         Nose: No congestion/rhinnorhea.   Mouth/Throat: Mucous membranes are moist.   Neck: No stridor.  No C-spine tenderness to palpation or range of motion. Cardiovascular/Chest: Normal rate, regular rhythm.  No murmurs, rubs, or gallops. Respiratory: Normal respiratory effort without tachypnea nor retractions. Breath sounds are clear and equal bilaterally. No wheezes/rales/rhonchi. Gastrointestinal: Soft. No distention, no guarding, no rebound. Nontender.    Genitourinary/rectal:Deferred Musculoskeletal: As stable but tender in the right hip. No leg shortening or external rotation. Neurovascularly intact in the extremities. No additional extremity pain. Neurologic:  Normal speech and language. No gross or focal neurologic deficits are appreciated. Skin:  Skin is warm, dry and intact. No rash noted. Psychiatric: Mood and affect are normal. Speech and behavior are normal. Patient exhibits appropriate insight and judgment.   ____________________________________________  LABS (pertinent positives/negatives)  Labs Reviewed - No data to display  ____________________________________________    EKG I, Governor Rooks, MD, the attending physician have personally viewed and interpreted all  ECGs.  None ____________________________________________  RADIOLOGY All Xrays were viewed by me. Imaging interpreted by Radiologist.  Pelvis x-ray:  IMPRESSION: 1. Nondisplaced fractures of the right superior inferior pubic rami. 2. Probable nondisplaced fracture of the right sacrum adjacent to the right SI joint.  CT lumbar spine: IMPRESSION: 1. No acute fracture or dislocation of the lumbar spine identified. 2. Minimal grade 1 L5-S1 anterolisthesis and loss of disc space height. Prominent lower lumbar facet arthrosis. 3. Please refer to concurrent pelvic CT for further evaluation of the bony pelvis.  CT pelvis:  IMPRESSION: 1. Nondisplaced fractures of the right superior and inferior pubic rami as described. 2. Probable fracture along the right anterior sacrum, not well-defined by CT or radiographs. Subtle nondisplaced fracture along the medial superior right iliac  crest. No other fractures. No dislocation. __________________________________________  PROCEDURES  Procedure(s) performed: None  Critical Care performed: None  ____________________________________________   ED COURSE / ASSESSMENT AND PLAN  Pertinent labs & imaging results that were available during my care of the patient were reviewed by me and considered in my medical decision making (see chart for details).   At least to pelvic fractures, right-sided pubic rami, fractures seen on x-ray. Given inability to obtain lumbar x-rays based on these pelvic fractures, patient will have an lumbar spine. Based on the possibility of additional pelvic fractures, CT of the pelvis was also obtained.  No head injury or neck injury or chest or abdominal complaints.  Patient was provided initial pain control here in the emergency department. She is unable to bear weight and will need hospital admission for pain control with new fractures.   CONSULTATIONS:  Ortho Dr. Odis Luster, by phone, will see in the hospital as needed.   Hospitalist for admission.   Patient / Family / Caregiver informed of clinical course, medical decision-making process, and agree with plan.  ___________________________________________   FINAL CLINICAL IMPRESSION(S) / ED DIAGNOSES   Final diagnoses:  Closed fracture of right superior pubic ramus, initial encounter (HCC)  Closed fracture of right inferior pubic ramus, initial encounter (HCC)  Abrasion of right arm, initial encounter  Closed fracture of sacrum, unspecified fracture morphology, initial encounter (HCC)  Closed displaced fracture of right ilium, unspecified fracture morphology, initial encounter [facility])              Note: This dictation was prepared with Dragon dictation. Any transcriptional errors that result from this process are unintentional    Governor Rooks, MD 11/25/16 1520

## 2016-11-25 NOTE — ED Notes (Signed)
Patient transported to CT 

## 2016-11-26 LAB — BASIC METABOLIC PANEL
Anion gap: 4 — ABNORMAL LOW (ref 5–15)
BUN: 18 mg/dL (ref 6–20)
CHLORIDE: 107 mmol/L (ref 101–111)
CO2: 25 mmol/L (ref 22–32)
CREATININE: 0.93 mg/dL (ref 0.44–1.00)
Calcium: 8.9 mg/dL (ref 8.9–10.3)
GFR calc Af Amer: >60 mL/min (ref >60)
GFR calc non Af Amer: 57 mL/min — ABNORMAL LOW (ref >60)
Glucose, Bld: 118 mg/dL — ABNORMAL HIGH (ref 65–99)
POTASSIUM: 3.9 mmol/L (ref 3.5–5.1)
Sodium: 136 mmol/L (ref 135–145)

## 2016-11-26 LAB — CBC
HCT: 36.9 % (ref 35.0–47.0)
Hemoglobin: 12.7 g/dL (ref 12.0–16.0)
MCH: 32.8 pg (ref 26.0–34.0)
MCHC: 34.3 g/dL (ref 32.0–36.0)
MCV: 95.5 fL (ref 80.0–100.0)
PLATELETS: 199 10*3/uL (ref 150–440)
RBC: 3.87 MIL/uL (ref 3.80–5.20)
RDW: 13 % (ref 11.5–14.5)
WBC: 8.9 10*3/uL (ref 3.6–11.0)

## 2016-11-26 NOTE — Clinical Social Work Placement (Signed)
   CLINICAL SOCIAL WORK PLACEMENT  NOTE  Date:  11/26/2016  Patient Details  Name: Jane [name] MRN: [id number] Date of Birth: 07/19/1935  Clinical Social Work is seeking post-discharge placement for this patient at the Skilled  Nursing Facility level of care (*CSW will initial, date and re-position this form in  chart as items are completed):  Yes   Patient/family provided with [facility] Clinical Social Work Department's list of facilities offering this level of care within the geographic area requested by the patient (or if unable, by the patient's family).  Yes   Patient/family informed of their freedom to choose among providers that offer the needed level of care, that participate in Medicare, Medicaid or managed care program needed by the patient, have an available bed and are willing to accept the patient.  Yes   Patient/family informed of [facility] ownership interest [facility] and [facility], as well as of the fact that they are under no obligation to receive care at these facilities.  PASRR submitted to EDS on 11/26/16     PASRR number received on 11/26/16     Existing PASRR number confirmed on       FL2 transmitted to all facilities in geographic area requested by pt/family on 11/26/16     FL2 transmitted to all facilities within larger geographic area on       Patient informed that his/her managed care company has contracts with or will negotiate with certain facilities, including the following:            Patient/family informed of bed offers received.  Patient chooses bed at       Physician recommends and patient chooses bed at      Patient to be transferred to   on  .  Patient to be transferred to facility by       Patient family notified on   of transfer.  Name of family member notified:        PHYSICIAN       Additional Comment:    _______________________________________________ Judi CongKaren M Rohail Klees, LCSW 11/26/2016, 11:53 AM

## 2016-11-26 NOTE — [place] FL2 (Signed)
[place] MEDICAID FL2 LEVEL OF CARE SCREENING TOOL     IDENTIFICATION  Patient Name: Jane [name] Birthdate: 12/18/1935 Sex: female Admission Date (Current Location): 11/25/2016  [place]   Facility and Address:  [facility], [address], [place], [place] [address]      Provider Number: [id number]  Attending Physician Name and Address:  Ramonita LabGouru, Yashika Mask, MD  Relative Name and Phone Number:       Current Level of Care: Hospital Recommended Level of Care: Skilled Nursing Facility Prior Approval Number:    Date Approved/Denied:   PASRR Number:    Discharge Plan: SNF    Current Diagnoses: Patient Active Problem List   Diagnosis Date Noted  . Pelvic fracture (HCC) 11/25/2016    Orientation RESPIRATION BLADDER Height & Weight     Self, Time, Situation, Place  Normal Continent Weight: 183 lb 9.6 oz (83.3 kg) Height:  5\' 3"  (160 cm)  BEHAVIORAL SYMPTOMS/MOOD NEUROLOGICAL BOWEL NUTRITION STATUS      Continent    AMBULATORY STATUS COMMUNICATION OF NEEDS Skin   Extensive Assist Verbally Normal                       Personal Care Assistance Level of Assistance  Bathing, Feeding, Dressing Bathing Assistance: Limited assistance Feeding assistance: Independent Dressing Assistance: Limited assistance     Functional Limitations Info             SPECIAL CARE FACTORS FREQUENCY  PT (By licensed PT)     PT Frequency: Up to 5X per day, 5 days per week              Contractures Contractures Info: Present    Additional Factors Info  Allergies   Allergies Info: Adhesive Tape, Codeine           Current Medications (11/26/2016):  This is the current hospital active medication list Current Facility-Administered Medications  Medication Dose Route Frequency Provider Last Rate Last Dose  . acetaminophen (TYLENOL) tablet 650 mg  650 mg Oral Q6H PRN [place] SirenSainani, Vivek J, MD       Or  . acetaminophen  (TYLENOL) suppository 650 mg  650 mg Rectal Q6H PRN [place] SirenSainani, Vivek J, MD      . ALPRAZolam Prudy Feeler(XANAX) tablet 0.5 mg  0.5 mg Oral QHS PRN [place] SirenSainani, Vivek J, MD      . aspirin EC tablet 81 mg  81 mg Oral Daily [place] SirenSainani, Vivek J, MD   81 mg at 11/26/16 0916  . atenolol (TENORMIN) tablet 50 mg  50 mg Oral Daily [place] SirenSainani, Vivek J, MD   50 mg at 11/26/16 0916  . cholecalciferol (VITAMIN D) tablet 2,000 Units  2,000 Units Oral Daily [place] SirenSainani, Vivek J, MD   2,000 Units at 11/26/16 [phone]  . clopidogrel (PLAVIX) tablet 75 mg  75 mg Oral Daily [place] SirenSainani, Vivek J, MD   75 mg at 11/26/16 0916  . enoxaparin (LOVENOX) injection 40 mg  40 mg Subcutaneous Q24H [place] SirenSainani, Vivek J, MD   40 mg at 11/25/16 2138  . escitalopram (LEXAPRO) tablet 10 mg  10 mg Oral Daily [place] SirenSainani, Vivek J, MD   10 mg at 11/26/16 0916  . furosemide (LASIX) tablet 20 mg  20 mg Oral Daily PRN [place] SirenSainani, Vivek J, MD      . isosorbide dinitrate (ISORDIL) tablet 30 mg  30 mg Oral BID [place] SirenSainani, Vivek J, MD   30 mg at 11/26/16 0916  .  labetalol (NORMODYNE) tablet 100 mg  100 mg Oral BID Houston Siren, MD   100 mg at 11/26/16 0916  . morphine 2 MG/ML injection 2 mg  2 mg Intravenous Q4H PRN Houston Siren, MD      . nitroGLYCERIN (NITROSTAT) SL tablet 0.4 mg  0.4 mg Sublingual Q5 min PRN Houston Siren, MD      . ondansetron (ZOFRAN) tablet 4 mg  4 mg Oral Q6H PRN Houston Siren, MD       Or  . ondansetron (ZOFRAN) injection 4 mg  4 mg Intravenous Q6H PRN Houston Siren, MD      . oxyCODONE-acetaminophen (PERCOCET/ROXICET) 5-325 MG per tablet 1 tablet  1 tablet Oral Q4H PRN Houston Siren, MD   1 tablet at 11/26/16 0916  . quinapril (ACCUPRIL) tablet 20 mg  20 mg Oral Daily Houston Siren, MD   20 mg at 11/26/16 1610     Discharge Medications: Please see discharge summary for a list of discharge medications.  Relevant Imaging Results:  Relevant Lab Results:   Additional Information SS# [id number]  Judi Cong, LCSW

## 2016-11-26 NOTE — Care Management Obs Status (Signed)
MEDICARE OBSERVATION STATUS NOTIFICATION   Patient Details  Name: Jane [name] MRN: [id number] Date of Birth: 02/07/1936   Medicare Observation Status Notification Given:  Yes (moon letter)    Jolee Ewingockett,Jahking Lesser A, RN 11/26/2016, 3:50 PM

## 2016-11-26 NOTE — Consult Note (Signed)
ORTHOPAEDIC CONSULTATION  REQUESTING PHYSICIAN: Ramonita Lab, MD  Chief Complaint: hip pain  HPI: Jane [name] is a 81 y.o. female who presents after a fall and noted to have right hip/pelvic pain. Patient says she had a mechanical fall in her backyard this past Thursday she was looking at some of her birds. She fell to the ground was lying there for 2 hours until her husband has arrived home.  Patient could not bear any weight on her right side and therefore was brought to the ER for further evaluation. She normally ambulates without any assistance. She does have a history of osteoporosis and vertebral compression fractures in the past.  Past Medical History:  Diagnosis Date  . Diverticulitis   . Hiatal hernia   . Hyperlipidemia   . Hypertension   . Lymphocytic colitis   . Mitral valve prolapse   . Osteoporosis   . Reflux    Past Surgical History:  Procedure Laterality Date  . APPENDECTOMY    . CARDIAC CATHETERIZATION    . CATARACT EXTRACTION    . CERVICAL DISC SURGERY    . CHOLECYSTECTOMY    . CORONARY STENT PLACEMENT    . FOOT FRACTURE SURGERY    . hemangioma    . TONSILLECTOMY    . TUBAL LIGATION     Social History   Social History  . Marital status: Married    Spouse name: N/A  . Number of children: N/A  . Years of education: N/A   Social History Main Topics  . Smoking status: Former Smoker    Types: Cigarettes  . Smokeless tobacco: Never Used  . Alcohol use No  . Drug use: No  . Sexual activity: Not Asked   Other Topics Concern  . None   Social History Narrative  . None   Family History  Problem Relation Age of Onset  . Breast cancer Paternal Aunt   . Diabetes Mother   . Hypertension Mother   . Heart attack Mother    Allergies  Allergen Reactions  . Adhesive [Tape]   . Codeine    Prior to Admission medications   Medication Sig Start Date End Date Taking? Authorizing Provider  acetaminophen (TYLENOL) 500 MG tablet Take 500 mg by mouth  every 6 (six) hours as needed.   Yes [provider]  ALPRAZolam Prudy Feeler) 0.5 MG tablet Take 0.5 mg by mouth at bedtime as needed for sleep.   Yes [provider]  aspirin 81 MG tablet Take 81 mg by mouth daily.   Yes [provider]  atenolol (TENORMIN) 50 MG tablet Take 50 mg by mouth daily. 09/30/16  Yes [provider]  Calcium Carbonate Antacid (TUMS E-X PO) Take by mouth.   Yes [provider]  cholecalciferol (VITAMIN D) 1000 units tablet Take 2,000 Units by mouth daily.    Yes [provider]  clopidogrel (PLAVIX) 75 MG tablet Take 75 mg by mouth daily.   Yes [provider]  escitalopram (LEXAPRO) 10 MG tablet Take 10 mg by mouth daily.   Yes [provider]  furosemide (LASIX) 20 MG tablet Take 20 mg by mouth daily as needed.    Yes [provider]  isosorbide dinitrate (ISORDIL) 30 MG tablet Take 30 mg by mouth 2 (two) times daily.    Yes [provider]  labetalol (NORMODYNE) 100 MG tablet Take 100 mg by mouth 2 (two) times daily.   Yes [provider]  Multiple Vitamins-Minerals (CENTRUM PO)  Take 1 tablet by mouth daily.    Yes [provider]  nitroGLYCERIN (NITROSTAT) 0.4 MG SL tablet Place 0.4 mg under the tongue every 5 (five) minutes as needed for chest pain.   Yes [provider]  quinapril (ACCUPRIL) 20 MG tablet Take 20 mg by mouth daily.    Yes [provider]   Dg Pelvis 1-2 Views  Result Date: 11/25/2016 CLINICAL DATA:  Larey SeatFell today patient is in a lot of pain mostly on her right side of her buttocks to her hip. No previous EXAM: PELVIS - 1-2 VIEW COMPARISON:  None. FINDINGS: There are right hemipelvis fractures. There is a fracture across the right superior pubis from the superior pubic symphysis to the superior margin of the obturator ring. There is a transverse fracture of the inferior pubic ramus. These fractures are not significantly displaced. There  is a probable sagittal oriented fracture across the right sacrum adjacent to the right SI joint. No other fractures. Hip joints and SI joints are normally aligned as is the symphysis pubis. Bones are demineralized. Soft tissues are unremarkable. IMPRESSION: 1. Nondisplaced fractures of the right superior inferior pubic rami. 2. Probable nondisplaced fracture of the right sacrum adjacent to the right SI joint. Electronically Signed   By: Amie Portlandavid  Ormond M.D.   On: 11/25/2016 13:45   Ct Lumbar Spine Wo Contrast  Result Date: 11/25/2016 CLINICAL DATA:  81 y/o  F; fall with back pain. EXAM: CT LUMBAR SPINE WITHOUT CONTRAST TECHNIQUE: Multidetector CT imaging of the lumbar spine was performed without intravenous contrast administration. Multiplanar CT image reconstructions were also generated. COMPARISON:  11/25/2016 pelvic radiographs. 11/25/2016 concurrent pelvic CT. FINDINGS: Segmentation: 5 lumbar type vertebrae. Alignment: Minimal grade 1 anterolisthesis at L5-S1. Vertebrae: No acute fracture or focal pathologic process. Paraspinal and other soft tissues: Extensive calcific atherosclerosis of the abdominal aorta. Disc levels: Mild loss of the L5-S1 intervertebral disc space and moderate lower lumbar facet arthrosis. IMPRESSION: 1. No acute fracture or dislocation of the lumbar spine identified. 2. Minimal grade 1 L5-S1 anterolisthesis and loss of disc space height. Prominent lower lumbar facet arthrosis. 3. Please refer to concurrent pelvic CT for further evaluation of the bony pelvis. Electronically Signed   By: Mitzi HansenLance  Furusawa-Stratton M.D.   On: 11/25/2016 15:13   Ct Pelvis Wo Contrast  Result Date: 11/25/2016 CLINICAL DATA:  Fall this morning.  Back and pelvic pain. EXAM: CT PELVIS WITHOUT CONTRAST TECHNIQUE: Multidetector CT imaging of the pelvis was performed following the standard protocol without intravenous contrast. COMPARISON:  Current pelvis radiographs. FINDINGS: Urinary Tract: Bladder is  unremarkable. There is mild stranding in the fat along the inferior margin of the bladder. Bowel:  Sigmoid colon diverticulosis.  No evidence of bowel injury. Vascular/Lymphatic: Aortic and iliac artery atherosclerotic calcifications. No aneurysm or evidence of a vascular injury. Reproductive:  Unremarkable. Other: There is significant atrophy of the right rectus abdominus muscle. No abdominal wall hernia. No pelvic free fluid. Musculoskeletal: As noted on current radiographs, there is a fracture of the right pubis extending to the superior pubic symphysis. Fracture is mildly comminuted, without significant displacement. There is a non comminuted nondisplaced transverse fracture of the inferior right pubic ramus. The fracture the right sacrum suggested on the radiographs remains not definitive. Small buckle of the anterior cortical margin of the right sacrum adjacent to the mid to inferior right SI joint suggests a nondisplaced fracture. There is a more definitive fracture line along the superior margin of the right iliac crest  just lateral to the SI joint. No other evidence of fractures. No bone lesions. There are degenerative changes of the lower lumbar spine. Bones are demineralized. IMPRESSION: 1. Nondisplaced fractures of the right superior and inferior pubic rami as described. 2. Probable fracture along the right anterior sacrum, not well-defined by CT or radiographs. Subtle nondisplaced fracture along the medial superior right iliac crest. No other fractures. No dislocation. Electronically Signed   By: Amie Portland M.D.   On: 11/25/2016 15:15    Positive ROS: All other systems have been reviewed and were otherwise negative with the exception of those mentioned in the HPI and as above.  Physical Exam: General: Alert, no acute distress Cardiovascular: No pedal edema Respiratory: No cyanosis, no use of accessory musculature GI: No organomegaly, abdomen is soft and non-tender Skin: No lesions in the  area of chief complaint Neurologic: Sensation intact distally Psychiatric: Patient is competent for consent with normal mood and affect Lymphatic: No axillary or cervical lymphadenopathy  MUSCULOSKELETAL: pain with flexion/extension and IR/ER of the right hip, referred pain to right side with movement of the left hip and knee. Knee joint line is non-tender. Motor and sensory are intact distally.  Assessment: Closed fracture of right superior pubic ramus, initial encounter (HCC)  Right hip pain - Plan: DG Pelvis 1-2 Views, DG Pelvis 1-2 Views  Closed fracture of right inferior pubic ramus, initial encounter (HCC)  Abrasion of right arm, initial encounter  Closed fracture of sacrum, unspecified fracture morphology, initial encounter (HCC)  Closed displaced fracture of right ilium, unspecified fracture morphology, initial encounter [facility])   Plan: The diagnosis, x-ray, and CT scan results are all discussed in detail with the patient. She may be weight bearing as tolerated on the right, however it could take up to 6 weeks to heal. She will require skilled nursing due to her limited mobility and pain. She may follow-up in my office in 3 to 4 weeks for repeat check and x-ray. Please call with questions.   Lyndle Herrlich, MD    11/26/2016 10:44 AM

## 2016-11-26 NOTE — Clinical Social Work Note (Signed)
CSW is following pending PT recommendations.   Argentina PonderKaren Martha Kynley Metzger, MSW, Theresia MajorsLCSWA [phone]

## 2016-11-26 NOTE — Progress Notes (Signed)
[facility] [place] [facility]   PATIENT NAME: Jane [name]    MR#:  [id number]  DATE OF BIRTH:  July 19, 1935  SUBJECTIVE:  CHIEF COMPLAINT:    Patient is complaining of severe pain with movements in the pelvic area and in the hip  REVIEW OF SYSTEMS:  CONSTITUTIONAL: No fever, fatigue or weakness.  EYES: No blurred or double vision.  EARS, NOSE, AND THROAT: No tinnitus or ear pain.  RESPIRATORY: No cough, shortness of breath, wheezing or hemoptysis.  CARDIOVASCULAR: No chest pain, orthopnea, edema.  GASTROINTESTINAL: No nausea, vomiting, diarrhea or abdominal pain.  GENITOURINARY: No dysuria, hematuria.  ENDOCRINE: No polyuria, nocturia,  HEMATOLOGY: No anemia, easy bruising or bleeding SKIN: No rash or lesion. MUSCULOSKELETAL: Reporting hip pain  NEUROLOGIC: No tingling, numbness, weakness.  PSYCHIATRY: No anxiety or depression.   DRUG ALLERGIES:   Allergies  Allergen Reactions  . Adhesive [Tape]   . Codeine     VITALS:  Blood pressure (!) 148/56, pulse 65, temperature 98.9 F (37.2 C), temperature source Oral, resp. rate 18, height 5\' 3"  (1.6 m), weight 83.3 kg (183 lb 9.6 oz), SpO2 96 %.  PHYSICAL EXAMINATION:  GENERAL:  81 y.o.-year-old patient lying in the bed with no acute distress.  EYES: Pupils equal, round, reactive to light and accommodation. No scleral icterus. Extraocular muscles intact.  HEENT: Head atraumatic, normocephalic. Oropharynx and nasopharynx clear.  NECK:  Supple, no jugular venous distention. No thyroid enlargement, no tenderness.  LUNGS: Normal breath sounds bilaterally, no wheezing, rales,rhonchi or crepitation. No use of accessory muscles of respiration.  CARDIOVASCULAR: S1, S2 normal. No murmurs, rubs, or gallops.  ABDOMEN: Soft, nontender, nondistended. Bowel sounds present. No organomegaly or mass.  EXTREMITIES: No pedal edema, cyanosis, or clubbing.  NEUROLOGIC: Awake and alert oriented 3, range of motion  limited because of the pelvic fracture and sacral fracture PSYCHIATRIC: The patient is alert and oriented x 3.  SKIN: No obvious rash, lesion, or ulcer.    LABORATORY PANEL:   CBC  Recent Labs Lab 11/26/16 0309  WBC 8.9  HGB 12.7  HCT 36.9  PLT 199   ------------------------------------------------------------------------------------------------------------------  Chemistries   Recent Labs Lab 11/26/16 0309  NA 136  K 3.9  CL 107  CO2 25  GLUCOSE 118*  BUN 18  CREATININE 0.93  CALCIUM 8.9   ------------------------------------------------------------------------------------------------------------------  Cardiac Enzymes No results for input(s): TROPONINI in the last 168 hours. ------------------------------------------------------------------------------------------------------------------  RADIOLOGY:  Dg Pelvis 1-2 Views  Result Date: 11/25/2016 CLINICAL DATA:  Larey Seat today patient is in a lot of pain mostly on her right side of her buttocks to her hip. No previous EXAM: PELVIS - 1-2 VIEW COMPARISON:  None. FINDINGS: There are right hemipelvis fractures. There is a fracture across the right superior pubis from the superior pubic symphysis to the superior margin of the obturator ring. There is a transverse fracture of the inferior pubic ramus. These fractures are not significantly displaced. There is a probable sagittal oriented fracture across the right sacrum adjacent to the right SI joint. No other fractures. Hip joints and SI joints are normally aligned as is the symphysis pubis. Bones are demineralized. Soft tissues are unremarkable. IMPRESSION: 1. Nondisplaced fractures of the right superior inferior pubic rami. 2. Probable nondisplaced fracture of the right sacrum adjacent to the right SI joint. Electronically Signed   By: Amie Portland M.D.   On: 11/25/2016 13:45   Ct Lumbar Spine Wo Contrast  Result Date: 11/25/2016 CLINICAL DATA:  81 y/o  F; fall with back pain.  EXAM: CT LUMBAR SPINE WITHOUT CONTRAST TECHNIQUE: Multidetector CT imaging of the lumbar spine was performed without intravenous contrast administration. Multiplanar CT image reconstructions were also generated. COMPARISON:  11/25/2016 pelvic radiographs. 11/25/2016 concurrent pelvic CT. FINDINGS: Segmentation: 5 lumbar type vertebrae. Alignment: Minimal grade 1 anterolisthesis at L5-S1. Vertebrae: No acute fracture or focal pathologic process. Paraspinal and other soft tissues: Extensive calcific atherosclerosis of the abdominal aorta. Disc levels: Mild loss of the L5-S1 intervertebral disc space and moderate lower lumbar facet arthrosis. IMPRESSION: 1. No acute fracture or dislocation of the lumbar spine identified. 2. Minimal grade 1 L5-S1 anterolisthesis and loss of disc space height. Prominent lower lumbar facet arthrosis. 3. Please refer to concurrent pelvic CT for further evaluation of the bony pelvis. Electronically Signed   By: Mitzi Hansen M.D.   On: 11/25/2016 15:13   Ct Pelvis Wo Contrast  Result Date: 11/25/2016 CLINICAL DATA:  Fall this morning.  Back and pelvic pain. EXAM: CT PELVIS WITHOUT CONTRAST TECHNIQUE: Multidetector CT imaging of the pelvis was performed following the standard protocol without intravenous contrast. COMPARISON:  Current pelvis radiographs. FINDINGS: Urinary Tract: Bladder is unremarkable. There is mild stranding in the fat along the inferior margin of the bladder. Bowel:  Sigmoid colon diverticulosis.  No evidence of bowel injury. Vascular/Lymphatic: Aortic and iliac artery atherosclerotic calcifications. No aneurysm or evidence of a vascular injury. Reproductive:  Unremarkable. Other: There is significant atrophy of the right rectus abdominus muscle. No abdominal wall hernia. No pelvic free fluid. Musculoskeletal: As noted on current radiographs, there is a fracture of the right pubis extending to the superior pubic symphysis. Fracture is mildly comminuted,  without significant displacement. There is a non comminuted nondisplaced transverse fracture of the inferior right pubic ramus. The fracture the right sacrum suggested on the radiographs remains not definitive. Small buckle of the anterior cortical margin of the right sacrum adjacent to the mid to inferior right SI joint suggests a nondisplaced fracture. There is a more definitive fracture line along the superior margin of the right iliac crest just lateral to the SI joint. No other evidence of fractures. No bone lesions. There are degenerative changes of the lower lumbar spine. Bones are demineralized. IMPRESSION: 1. Nondisplaced fractures of the right superior and inferior pubic rami as described. 2. Probable fracture along the right anterior sacrum, not well-defined by CT or radiographs. Subtle nondisplaced fracture along the medial superior right iliac crest. No other fractures. No dislocation. Electronically Signed   By: Amie Portland M.D.   On: 11/25/2016 15:15    EKG:   Orders placed or performed in visit on 02/12/12  . EKG 12-Lead  . EKG 12-Lead    ASSESSMENT AND PLAN:   81 year old female with past medical history of gastroesophageal reflux disease, osteoporosis, mitral prolapse, hypertension, hyperlipidemia, history of diverticulitis of presents to the hospital after mechanical fall and noted to have a pelvic/sacral fracture.  1. Status post fall with pelvic and sacral fracture-this is secondary to a mechanical fall. -Continue pain control as needed and supportive care -Orthopedics consult is pending, likely nonoperable and patient is also not considering surgery at this time -PT is recommending skilled nursing facility   2. Essential hypertension-continue atenolol, labetalol, Imdur, Quinapril.  3. Depression-continue Lexapro.  4. Anxiety - cont. Xanax.   5. Hx of CAD - no acute chest pain.  - cont. ASA, Plavix, B-blocker, Imdur.     Disposition skilled nursing facility  when bed  is available   All the records are reviewed and case discussed with Care Management/Social Workerr. Management plans discussed with the patient, family and they are in agreement.  CODE STATUS: fc   TOTAL TIME TAKING CARE OF THIS PATIENT: 35  minutes.   POSSIBLE D/C IN 1-2  DAYS, DEPENDING ON CLINICAL CONDITION.  Note: This dictation was prepared with Dragon dictation along with smaller phrase technology. Any transcriptional errors that result from this process are unintentional.   Ramonita LabGouru, Genelle Economou M.D on 11/26/2016 at 11:10 AM  Between 7am to 6pm - Pager - [phone] After 6pm go to [email or web address] - password [email or web address] [email or web address]  [facility]  Office  [phone]  CC: Primary care physician; Dorothey BasemanBronstein, David, MD

## 2016-11-26 NOTE — Clinical Social Work Note (Signed)
Clinical Social Work Assessment  Patient Details  Name: Jane [name] MRN: [id number] Date of Birth: May 28, 1936  Date of referral:  11/26/16               Reason for consult:  Facility Placement                Permission sought to share information with:  Chartered certified accountant granted to share information::  Yes, Verbal Permission Granted  Name::        Agency::     Relationship::     Contact Information:     Housing/Transportation Living arrangements for the past 2 months:  Single Family Home Source of Information:  Patient, Spouse Patient Interpreter Needed:  None Criminal Activity/Legal Involvement Pertinent to Current Situation/Hospitalization:  No - Comment as needed Significant Relationships:  Adult Children, Spouse Lives with:  Spouse Do you feel safe going back to the place where you live?  Yes Need for family participation in patient care:  No (Coment)  Care giving concerns:  PT recommendation for SNF/STR   Social Worker assessment / plan:  CSW met with the patient and her son and husband at bedside to discuss discharge planning. The patient is aware that she is under observation status and is able to go to SNF pending insurance authorization as she has Multimedia programmer. The patient gave verbal permission to conduct the bed search.  The CSW received bed offers, and the patient has chosen [facility] is waiting for approval from her administration to accept the patient over the weekend due to the need for insurance approval. The facility does not accept LOGs in lieu of insurance approval.    Employment status:  Retired Nurse, adult PT Recommendations:  [place] / Referral to community resources:  [place]  Patient/Family's Response to care:  The patient and family thanked the CSW for assistance with discharge planning.  Patient/Family's Understanding  of and Emotional Response to Diagnosis, Current Treatment, and Prognosis:  The family is in agreement with the discharge plan.  Emotional Assessment Appearance:  Appears stated age Attitude/Demeanor/Rapport:   (Pleasant) Affect (typically observed):  Appropriate, Pleasant, Other (Pained expression at times) Orientation:  Oriented to Self, Oriented to Place, Oriented to  Time, Oriented to Situation Alcohol / Substance use:  Never Used Psych involvement (Current and /or in the community):  No (Comment)  Discharge Needs  Concerns to be addressed:  Care Coordination, Discharge Planning Concerns Readmission within the last 30 days:  No Current discharge risk:  None Barriers to Discharge:  Continued Medical Work up, [place], LCSW 11/26/2016, 11:50 AM

## 2016-11-26 NOTE — Progress Notes (Addendum)
Physical Therapy Evaluation Patient Details Name: Jane [name] MRN: [id number] DOB: 03/21/1936 Today's Date: 11/26/2016   History of Present Illness  Patient is a 81 y.o. female with mechanical fall and fx on 22 JUN. Found to have nondisplaced R superior and inferior pubic rami fracture, probable fx anterior sacrum, and nondisplaced fx medial superior R iliac crest. PMH includes HTN, HLD, mitral valve prolapse, diverticulitis, osteoporosis, GERD, and bilateral hernia.  Clinical Impression  Patient is a pleasant female admitted for above listed reasons. Patient previously independent with ADLs/chores and is currently unable to complete bed mobility, supine to sit, transfer. Patient very limited by pain and weakness in R LE, demonstrating need for maximal assistance and +2 to return to supine and [facility]. Because patient lives with husband who is unable to assist at necessary level, it is believed that patient will benefit from skilled and progressive PT [facility] and [facility] future falls with acquired injury.    Follow Up Recommendations SNF    Equipment Recommendations  None recommended by PT    Recommendations for Other Services       Precautions / Restrictions Precautions Precautions: Fall Restrictions Weight Bearing Restrictions: No      Mobility  Bed Mobility Overal bed mobility: Needs Assistance Bed Mobility: Supine to Sit     Supine to sit: Max assist;HOB elevated;+2 for physical assistance     General bed mobility comments: Patient initially requiring minimal assistance with R LE to move from supine to sit. Unable to complete final part of transfer with maximal assistance, limited by pain. Required +2 to [facility]  Transfers                 General transfer comment: Unable to perform at this time  Ambulation/Gait             General Gait Details: Unable to perform at this time  Stairs            Wheelchair Mobility     Modified Rankin (Stroke Patients Only)       Balance Overall balance assessment: History of Falls                                           Pertinent Vitals/Pain Pain Assessment: Faces Faces Pain Scale: Hurts even more Pain Location: R hip Pain Descriptors / Indicators: Aching Pain Intervention(s): Limited activity within patient's tolerance;Monitored during session;Repositioned;Utilized relaxation techniques;Premedicated before session    Home Living Family/patient expects to be discharged to:: Private residence Living Arrangements: Spouse/significant other Available Help at Discharge: Family;Available PRN/intermittently Type of Home: House Home Access: Stairs to enter Entrance Stairs-Rails: Can reach both Entrance Stairs-Number of Steps: 2 Home Layout: One level Home Equipment: Walker - 2 wheels;Bedside commode      Prior Function Level of Independence: Independent         Comments: Patient previously independent, living with husband.     Hand Dominance        Extremity/Trunk Assessment   Upper Extremity Assessment Upper Extremity Assessment: Overall WFL for tasks assessed    Lower Extremity Assessment Lower Extremity Assessment: RLE deficits/detail RLE Deficits / Details: 3/5 hip flexion/abduction, 3+/5 hip adduction, 4-/5 knee/ankle strength; limited by pain RLE: Unable to fully assess due to pain       Communication   Communication: No difficulties  Cognition Arousal/Alertness: Awake/alert Behavior  During Therapy: WFL for tasks assessed/performed Overall Cognitive Status: Within Functional Limits for tasks assessed                                        General Comments      Exercises     Assessment/Plan    PT Assessment Patient needs continued PT services  PT Problem List Decreased strength;Decreased range of motion;Decreased activity tolerance;Decreased balance;Decreased mobility;Pain       PT  Treatment Interventions DME instruction;Gait training;Stair training;Functional mobility training;Therapeutic exercise;Therapeutic activities;Patient/family education    PT Goals (Current goals can be found in the Care Plan section)  Acute Rehab PT Goals Patient Stated Goal: "To go home" PT Goal Formulation: With patient/family Time For Goal Achievement: 12/10/16 Potential to Achieve Goals: Good    Frequency 7X/week   Barriers to discharge Decreased caregiver support;Inaccessible home environment      Co-evaluation               AM-PAC PT "6 Clicks" Daily Activity  Outcome Measure Difficulty turning over in bed (including adjusting bedclothes, sheets and blankets)?: A Lot Difficulty moving from lying on back to sitting on the side of the bed? : Total Difficulty sitting down on and standing up from a chair with arms (e.g., wheelchair, bedside commode, etc,.)?: Total Help needed moving to and from a bed to chair (including a wheelchair)?: Total Help needed walking in hospital room?: Total Help needed climbing 3-5 steps with a railing? : Total 6 Click Score: 7    End of Session   Activity Tolerance: Patient limited by pain Patient left: in bed;with call bell/phone within reach;with bed alarm set;with nursing/sitter in room   PT Visit Diagnosis: Muscle weakness (generalized) (M62.81);History of falling (Z91.81);Difficulty in walking, not elsewhere classified (R26.2);Pain Pain - Right/Left: Right Pain - part of body: Hip    Time: 1010-1038 PT Time Calculation (min) (ACUTE ONLY): 28 min   Charges:   PT Evaluation $PT Eval Low Complexity: 1 Procedure     PT G Codes:   PT G-Codes **NOT FOR INPATIENT CLASS** Functional Assessment Tool Used: AM-PAC 6 Clicks Basic Mobility;Clinical judgement Functional Limitation: Mobility: Walking and moving around Mobility: Walking and Moving Around Current Status (Z6109): At least 80 percent but less than 100 percent impaired, limited or  restricted Mobility: Walking and Moving Around Goal Status [id number]): At least 20 percent but less than 40 percent impaired, limited or restricted      Neita Carp, PT, DPT 11/26/2016, 12:14 PM

## 2016-11-27 NOTE — Progress Notes (Signed)
[facility] - [place] [facility]   PATIENT NAME: Jane [name]    MR#:  [id number]  DATE OF BIRTH:  [birth year]  SUBJECTIVE:  CHIEF COMPLAINT:    Patient'S pain is manageable with the current treatment regimen, working with physical therapy  REVIEW OF SYSTEMS:  CONSTITUTIONAL: No fever, fatigue or weakness.  EYES: No blurred or double vision.  EARS, NOSE, AND THROAT: No tinnitus or ear pain.  RESPIRATORY: No cough, shortness of breath, wheezing or hemoptysis.  CARDIOVASCULAR: No chest pain, orthopnea, edema.  GASTROINTESTINAL: No nausea, vomiting, diarrhea or abdominal pain.  GENITOURINARY: No dysuria, hematuria.  ENDOCRINE: No polyuria, nocturia,  HEMATOLOGY: No anemia, easy bruising or bleeding SKIN: No rash or lesion. MUSCULOSKELETAL: Reporting hip pain On the right side NEUROLOGIC: No tingling, numbness, weakness.  PSYCHIATRY: No anxiety or depression.   DRUG ALLERGIES:   Allergies  Allergen Reactions  . Adhesive [Tape]   . Codeine     VITALS:  Blood pressure (!) 171/56, pulse 70, temperature 99.2 F (37.3 C), temperature source Oral, resp. rate 19, height 5\' 3"  (1.6 m), weight 83.3 kg (183 lb 9.6 oz), SpO2 95 %.  PHYSICAL EXAMINATION:  GENERAL:  81 y.o.-year-old patient lying in the bed with no acute distress.  EYES: Pupils equal, round, reactive to light and accommodation. No scleral icterus. Extraocular muscles intact.  HEENT: Head atraumatic, normocephalic. Oropharynx and nasopharynx clear.  NECK:  Supple, no jugular venous distention. No thyroid enlargement, no tenderness.  LUNGS: Normal breath sounds bilaterally, no wheezing, rales,rhonchi or crepitation. No use of accessory muscles of respiration.  CARDIOVASCULAR: S1, S2 normal. No murmurs, rubs, or gallops.  ABDOMEN: Soft, nontender, nondistended. Bowel sounds present. No organomegaly or mass.  EXTREMITIES: No pedal edema, cyanosis, or clubbing.  NEUROLOGIC: Awake and alert oriented  3, range of motion limited because of the pelvic fracture and sacral fracture PSYCHIATRIC: The patient is alert and oriented x 3.  SKIN: No obvious rash, lesion, or ulcer.    LABORATORY PANEL:   CBC  Recent Labs Lab 11/26/16 0309  WBC 8.9  HGB 12.7  HCT 36.9  PLT 199   ------------------------------------------------------------------------------------------------------------------  Chemistries   Recent Labs Lab 11/26/16 0309  NA 136  K 3.9  CL 107  CO2 25  GLUCOSE 118*  BUN 18  CREATININE 0.93  CALCIUM 8.9   ------------------------------------------------------------------------------------------------------------------  Cardiac Enzymes No results for input(s): TROPONINI in the last 168 hours. ------------------------------------------------------------------------------------------------------------------  RADIOLOGY:  Dg Pelvis 1-2 Views  Result Date: 11/25/2016 CLINICAL DATA:  Larey Seat today patient is in a lot of pain mostly on her right side of her buttocks to her hip. No previous EXAM: PELVIS - 1-2 VIEW COMPARISON:  None. FINDINGS: There are right hemipelvis fractures. There is a fracture across the right superior pubis from the superior pubic symphysis to the superior margin of the obturator ring. There is a transverse fracture of the inferior pubic ramus. These fractures are not significantly displaced. There is a probable sagittal oriented fracture across the right sacrum adjacent to the right SI joint. No other fractures. Hip joints and SI joints are normally aligned as is the symphysis pubis. Bones are demineralized. Soft tissues are unremarkable. IMPRESSION: 1. Nondisplaced fractures of the right superior inferior pubic rami. 2. Probable nondisplaced fracture of the right sacrum adjacent to the right SI joint. Electronically Signed   By: Amie Portland M.D.   On: 11/25/2016 13:45   Ct Lumbar Spine Wo Contrast  Result Date: 11/25/2016 CLINICAL DATA:  81 y/o  F;  fall with back pain. EXAM: CT LUMBAR SPINE WITHOUT CONTRAST TECHNIQUE: Multidetector CT imaging of the lumbar spine was performed without intravenous contrast administration. Multiplanar CT image reconstructions were also generated. COMPARISON:  11/25/2016 pelvic radiographs. 11/25/2016 concurrent pelvic CT. FINDINGS: Segmentation: 5 lumbar type vertebrae. Alignment: Minimal grade 1 anterolisthesis at L5-S1. Vertebrae: No acute fracture or focal pathologic process. Paraspinal and other soft tissues: Extensive calcific atherosclerosis of the abdominal aorta. Disc levels: Mild loss of the L5-S1 intervertebral disc space and moderate lower lumbar facet arthrosis. IMPRESSION: 1. No acute fracture or dislocation of the lumbar spine identified. 2. Minimal grade 1 L5-S1 anterolisthesis and loss of disc space height. Prominent lower lumbar facet arthrosis. 3. Please refer to concurrent pelvic CT for further evaluation of the bony pelvis. Electronically Signed   By: Mitzi Hansen M.D.   On: 11/25/2016 15:13   Ct Pelvis Wo Contrast  Result Date: 11/25/2016 CLINICAL DATA:  Fall this morning.  Back and pelvic pain. EXAM: CT PELVIS WITHOUT CONTRAST TECHNIQUE: Multidetector CT imaging of the pelvis was performed following the standard protocol without intravenous contrast. COMPARISON:  Current pelvis radiographs. FINDINGS: Urinary Tract: Bladder is unremarkable. There is mild stranding in the fat along the inferior margin of the bladder. Bowel:  Sigmoid colon diverticulosis.  No evidence of bowel injury. Vascular/Lymphatic: Aortic and iliac artery atherosclerotic calcifications. No aneurysm or evidence of a vascular injury. Reproductive:  Unremarkable. Other: There is significant atrophy of the right rectus abdominus muscle. No abdominal wall hernia. No pelvic free fluid. Musculoskeletal: As noted on current radiographs, there is a fracture of the right pubis extending to the superior pubic symphysis. Fracture is  mildly comminuted, without significant displacement. There is a non comminuted nondisplaced transverse fracture of the inferior right pubic ramus. The fracture the right sacrum suggested on the radiographs remains not definitive. Small buckle of the anterior cortical margin of the right sacrum adjacent to the mid to inferior right SI joint suggests a nondisplaced fracture. There is a more definitive fracture line along the superior margin of the right iliac crest just lateral to the SI joint. No other evidence of fractures. No bone lesions. There are degenerative changes of the lower lumbar spine. Bones are demineralized. IMPRESSION: 1. Nondisplaced fractures of the right superior and inferior pubic rami as described. 2. Probable fracture along the right anterior sacrum, not well-defined by CT or radiographs. Subtle nondisplaced fracture along the medial superior right iliac crest. No other fractures. No dislocation. Electronically Signed   By: Amie Portland M.D.   On: 11/25/2016 15:15    EKG:   Orders placed or performed in visit on 02/12/12  . EKG 12-Lead  . EKG 12-Lead    ASSESSMENT AND PLAN:   81 year old female with past medical history of gastroesophageal reflux disease, osteoporosis, mitral prolapse, hypertension, hyperlipidemia, history of diverticulitis of presents to the hospital after mechanical fall and noted to have a pelvic/sacral fracture.  1. Status post fall with pelvic and sacral fracture-this is secondary to a mechanical fall. -Continue pain Management as needed and supportive care -Orthopedics -recommending weightbearing as tolerated and rehabilitation, outpatient follow-up in 3-4 weeks after discharge -PT is recommending skilled nursing facility   2. Essential hypertension-continue atenolol, labetalol, Imdur, Quinapril.  3. Depression-continue Lexapro.  4. Anxiety - cont. Xanax.   5. Hx of CAD - no acute chest pain.  - cont. ASA, Plavix, B-blocker, Imdur.      Disposition skilled nursing facility when bed is available  All the records are reviewed and case discussed with Care Management/Social Workerr. Management plans discussed with the patient, family and they are in agreement.  CODE STATUS: fc   TOTAL TIME TAKING CARE OF THIS PATIENT: 35  minutes.   POSSIBLE D/C IN 1-2  DAYS, DEPENDING ON CLINICAL CONDITION.  Note: This dictation was prepared with Dragon dictation along with smaller phrase technology. Any transcriptional errors that result from this process are unintentional.   Ramonita LabGouru, Maegen Wigle M.D on 11/27/2016 at 11:06 AM  Between 7am to 6pm - Pager - [phone] After 6pm go to [email or web address] - password [email or web address] [facility]  [facility]  Office  [phone]  CC: Primary care physician; Dorothey BasemanBronstein, David, MD

## 2016-11-27 NOTE — Progress Notes (Signed)
Physical Therapy Treatment Patient Details Name: Jane [name] MRN: [id number] DOB: 1936/04/25 Today's Date: 11/27/2016    History of Present Illness Patient is a 81 y.o. female with mechanical fall and fx on 22 JUN. Found to have nondisplaced R superior and inferior pubic rami fracture, probable fx anterior sacrum, and nondisplaced fx medial superior R iliac crest. PMH includes HTN, HLD, mitral valve prolapse, diverticulitis, osteoporosis, GERD, and bilateral hernia.    PT Comments    Participated in exercises as described below.  "That was better today".  Attempted to get pt edge of bed this am but she was unable to complete transfer due to pain.  +2 assist to initiate sitting but half way to edge of bed she stiffens and pushes backwards yelling "I can't".    Discussed at length expectations for recovery and importance of mobility. Verbalizes understanding.  Pt teary at times during session.  Stated her son is in ER downstairs for cardiac event.  Will continue mobility as appropriate.   Follow Up Recommendations  SNF     Equipment Recommendations  None recommended by PT    Recommendations for Other Services       Precautions / Restrictions Precautions Precautions: Fall Restrictions Weight Bearing Restrictions: No    Mobility  Bed Mobility Overal bed mobility: Needs Assistance Bed Mobility: Supine to Sit     Supine to sit: Max assist;HOB elevated;+2 for physical assistance     General bed mobility comments: Patient initially requiring minimal assistance with R LE to move from supine to sit. Unable to complete final part of transfer with maximal assistance, limited by pain. Required +2 to scoot to [facility].  Transfers                 General transfer comment: Unable to perform at this time  Ambulation/Gait             General Gait Details: Unable to perform at this time   Stairs            Wheelchair Mobility    Modified Rankin (Stroke Patients  Only)       Balance Overall balance assessment: History of Falls                                          Cognition Arousal/Alertness: Awake/alert Behavior During Therapy: WFL for tasks assessed/performed Overall Cognitive Status: Within Functional Limits for tasks assessed                                        Exercises Other Exercises Other Exercises: BLE ankle pumps, quad and glut sets AROM, RLE AA/PROM LLE AAROM SLR, heel slides and ab/add x 10    General Comments        Pertinent Vitals/Pain Pain Assessment: 0-10 Pain Score: 10-Worst pain ever Pain Location: with moveement - recieved pain meds prior Pain Descriptors / Indicators: Aching Pain Intervention(s): Limited activity within patient's tolerance;Premedicated before session;Monitored during session    Home Living                      Prior Function            PT Goals (current goals can now be found in the care plan section) Progress towards PT goals: Progressing  toward goals    Frequency    7X/week      PT Plan Current plan remains appropriate    Co-evaluation              AM-PAC PT "6 Clicks" Daily Activity  Outcome Measure  Difficulty turning over in bed (including adjusting bedclothes, sheets and blankets)?: A Lot Difficulty moving from lying on back to sitting on the side of the bed? : Total Difficulty sitting down on and standing up from a chair with arms (e.g., wheelchair, bedside commode, etc,.)?: Total Help needed moving to and from a bed to chair (including a wheelchair)?: Total Help needed walking in hospital room?: Total Help needed climbing 3-5 steps with a railing? : Total 6 Click Score: 7    End of Session   Activity Tolerance: Patient limited by pain Patient left: in bed;with call bell/phone within reach;with bed alarm set;with family/visitor present   Pain - Right/Left: Right Pain - part of body: Hip     Time:  [id number] PT Time Calculation (min) (ACUTE ONLY): 23 min  Charges:  $Therapeutic Exercise: 8-22 mins $Therapeutic Activity: 8-22 mins                    G Codes:      Danielle DessSarah Marrissa Dai, PTA 11/27/16, 10:37 AM

## 2016-11-28 DIAGNOSIS — I10 Essential (primary) hypertension: Secondary | ICD-10-CM | POA: Diagnosis present

## 2016-11-28 MED ORDER — OXYCODONE-ACETAMINOPHEN 5-325 MG PO TABS: 1.0000 | ORAL_TABLET | ORAL | 0 refills | Status: DC | PRN

## 2016-11-28 MED ORDER — ONDANSETRON HCL 4 MG PO TABS: 4.0000 mg | ORAL_TABLET | Freq: Four times a day (QID) | ORAL | 0 refills | Status: DC | PRN

## 2016-11-28 MED ORDER — ALPRAZOLAM 0.5 MG PO TABS: 0.5000 mg | ORAL_TABLET | Freq: Every evening | ORAL | 0 refills | Status: DC | PRN

## 2016-11-28 NOTE — Progress Notes (Signed)
[facility]. Clinical Social Worker (CSW) will continue to follow and assist as needed.   Baker Hughes IncorporatedBailey Carla Rashad, LCSW [phone]

## 2016-11-28 NOTE — Progress Notes (Signed)
Physical Therapy Treatment Patient Details Name: Jane BlowSallie S Goffredo MRN: [id number] DOB: 03/08/1936 Today's Date: 11/28/2016    History of Present Illness Patient is a 81 y.o. female with mechanical fall and fx on 22 JUN. Found to have nondisplaced R superior and inferior pubic rami fracture, probable fx anterior sacrum, and nondisplaced fx medial superior R iliac crest. PMH includes HTN, HLD, mitral valve prolapse, diverticulitis, osteoporosis, GERD, and bilateral hernia.    PT Comments    Pt is a pleasant 81 y.o. F, admitted to acute care for mechanical fall and fx. Pt performs bed mobility with maxA +2, secondary to pain through R LE with R LE mobility. Pt highly motivated following transition from supine to sit and performed tranfers and amb with ModA (second person with amb for safety purposes). Amb a total of 3 ft, initially requiring minA to progress R LE. Pt with 10/10 pain during transfer/amb, which reduced quickly to 0/10 once discontinuing activity. Pt is highly motivated and willing to participate for return to PLOF, limited only by pain. She is progressing well towards her goals, as seen by progression to participation in transfers and amb this am. Pt continues to present with the following deficits: pain and strength. Overall, pt responded well to today's treatment with no adverse affects. Pt would benefit from continued skilled PT to address the previously mentioned impairments and promote return to PLOF. Currently recommending SNF, pending d/c.  Follow Up Recommendations  SNF     Equipment Recommendations  None recommended by PT    Recommendations for Other Services       Precautions / Restrictions Precautions Precautions: Fall Restrictions Weight Bearing Restrictions: Yes RLE Weight Bearing: Weight bearing as tolerated    Mobility  Bed Mobility Overal bed mobility: Needs Assistance Bed Mobility: Supine to Sit     Supine to sit: Max assist;+2 for physical assistance     General bed mobility comments: Pt requiring MaxA +2 for physical assist to transfer to sitting at EOB, secondary to c/o pain through R hip with R LE movement.   Transfers Overall transfer level: Needs assistance Equipment used: Rolling walker (2 wheeled) Transfers: Sit to/from Stand Sit to Stand: Mod assist         General transfer comment: ModA with STS, using RW. Verbal cues given for handplacement, body mechanics, weight shifting, and safety awareness. Pt follows commands well, and is limited by pain to R hip.   Ambulation/Gait Ambulation/Gait assistance: Mod assist;+2 safety/equipment Ambulation Distance (Feet): 3 Feet Assistive device: Rolling walker (2 wheeled) Gait Pattern/deviations: Shuffle;Decreased step length - right;Decreased step length - left     General Gait Details: Shuffling gait pattern with ModA, with second person for safety purposes as pt is limited by pain when weightbearing through R LE. MinA initially to progress R LE.    Stairs            Wheelchair Mobility    Modified Rankin (Stroke Patients Only)       Balance Overall balance assessment: History of Falls                                          Cognition Arousal/Alertness: Awake/alert Behavior During Therapy: WFL for tasks assessed/performed Overall Cognitive Status: Within Functional Limits for tasks assessed  Exercises Other Exercises Other Exercises: SUpine therex performed with supervision x10 reps: B ankle pumps, B quad sets, and L LE hip abd (pt unable to move R LE, seocndary to pain; would not allow SPT to passivly move L LE due to pain and high guarding). 10 reps standing weight shifts to B LE to progress pt tolerance to WBAT through R LE.     General Comments        Pertinent Vitals/Pain Pain Assessment: 0-10 Pain Score: 10-Worst pain ever Pain Location: R hip - only with movement Pain Descriptors  / Indicators: Aching ("pinch") Pain Intervention(s): Limited activity within patient's tolerance;Monitored during session    Home Living                      Prior Function            PT Goals (current goals can now be found in the care plan section) Acute Rehab PT Goals Patient Stated Goal: "To go home" PT Goal Formulation: With patient/family Time For Goal Achievement: 12/10/16 Potential to Achieve Goals: Good Progress towards PT goals: Progressing toward goals    Frequency    7X/week      PT Plan Current plan remains appropriate    Co-evaluation              AM-PAC PT "6 Clicks" Daily Activity  Outcome Measure  Difficulty turning over in bed (including adjusting bedclothes, sheets and blankets)?: Total Difficulty moving from lying on back to sitting on the side of the bed? : Total Difficulty sitting down on and standing up from a chair with arms (e.g., wheelchair, bedside commode, etc,.)?: Total Help needed moving to and from a bed to chair (including a wheelchair)?: A Lot Help needed walking in hospital room?: A Lot Help needed climbing 3-5 steps with a railing? : Total 6 Click Score: 8    End of Session Equipment Utilized During Treatment: Gait belt Activity Tolerance: Patient limited by pain Patient left: in chair;with call bell/phone within reach;with chair alarm set Nurse Communication: Mobility status PT Visit Diagnosis: Muscle weakness (generalized) (M62.81);History of falling (Z91.81);Difficulty in walking, not elsewhere classified (R26.2);Pain Pain - Right/Left: Right Pain - part of body: Hip     Time: 1610-9604 PT Time Calculation (min) (ACUTE ONLY): 20 min  Charges:                       G Codes:       Sharman Cheek PT, SPT   Latanya Maudlin 11/28/2016, 12:42 PM

## 2016-11-28 NOTE — Progress Notes (Signed)
[facility]. Report given Kellie. EMS called for transportation. Printed prescription for Percocet and Alprazolam in transport package along with printed AVS.

## 2016-11-28 NOTE — Discharge Instructions (Signed)
Follow-up with primary care physician at the facility in 3-4 days or sooner as needed Follow-up with orthopedics Dr. Derryl HarborBower in 3-4 weeks Continue physical therapy at rehabilitation Center

## 2016-11-28 NOTE — Discharge Summary (Signed)
[facility] [place] [facility]   PATIENT NAME: Jane [name]    MR#:  [id number]  DATE OF BIRTH:  02-15-1936  DATE OF ADMISSION:  11/25/2016 ADMITTING PHYSICIAN: Houston Siren, MD  DATE OF DISCHARGE: 11/28/16  PRIMARY CARE PHYSICIAN: Dorothey Baseman, MD    ADMISSION DIAGNOSIS:  Right hip pain [M25.551] Abrasion of right arm, initial encounter [S40.811A] Closed fracture of right inferior pubic ramus, initial encounter (HCC) [S32.591A] Closed fracture of right superior pubic ramus, initial encounter (HCC) [S32.511A] Closed displaced fracture of right ilium, unspecified fracture morphology, initial encounter (HCC) [S32.301A] Closed fracture of sacrum, unspecified fracture morphology, initial encounter (HCC) [S32.10XA]  DISCHARGE DIAGNOSIS:  Active Problems:   Pelvic fracture (HCC)   SECONDARY DIAGNOSIS:   Past Medical History:  Diagnosis Date  . Diverticulitis   . Hiatal hernia   . Hyperlipidemia   . Hypertension   . Lymphocytic colitis   . Mitral valve prolapse   . Osteoporosis   . Reflux     HOSPITAL COURSE:  HPI Jane [name]  is a 81 y.o. female with a known history of Hypertension, hyperlipidemia, mitral valve prolapse, diverticulitis, osteoporosis, GERD, history of bilateral hernia presents to the hospital after mechanical fall and noted to have right hip/pelvic pain. Patient says she had a mechanical fall in her backyard yesterday she was looking at some of her birds. She fell to the ground was lying there for 2 hours until her husband has arrived home.  Patient could not bear any weight on her right side and therefore was brought to the ER for further evaluation. Patient underwent CT of her pelvis which showed a pelvic/sacral fracture. Status post services were contacted further treatment and evaluation. Patient denies any prodromal symptoms prior to her fall including dizziness, chest pain, shortness of breath, nausea vomiting or any other  associated symptoms.  1. Status post fall with pelvic and sacral fracture-this is secondary to a mechanical fall. -Continue pain Management as needed and supportive care -Orthopedics -recommending weightbearing as tolerated and rehabilitation, outpatient follow-up in 3-4 weeks after discharge -PT is recommending skilled nursing facility   2. Essential hypertension-continue atenolol, labetalol, Imdur, Quinapril.  3. Depression-continue Lexapro.  4. Anxiety - cont. Xanax.   5. Hx of CAD - no acute chest pain.  - cont. ASA, Plavix, B-blocker, Imdur.   d/c pt to SNF   DISCHARGE CONDITIONS:   stable  CONSULTS OBTAINED:  Treatment Team:  Lyndle Herrlich, MD   PROCEDURES  None   DRUG ALLERGIES:   Allergies  Allergen Reactions  . Adhesive [Tape]   . Codeine     DISCHARGE MEDICATIONS:   Current Discharge Medication List    START taking these medications   Details  ondansetron (ZOFRAN) 4 MG tablet Take 1 tablet (4 mg total) by mouth every 6 (six) hours as needed for nausea. Qty: 20 tablet, Refills: 0    oxyCODONE-acetaminophen (PERCOCET/ROXICET) 5-325 MG tablet Take 1 tablet by mouth every 4 (four) hours as needed for moderate pain. Qty: 20 tablet, Refills: 0      CONTINUE these medications which have CHANGED   Details  ALPRAZolam (XANAX) 0.5 MG tablet Take 1 tablet (0.5 mg total) by mouth at bedtime as needed for sleep. Qty: 10 tablet, Refills: 0      CONTINUE these medications which have NOT CHANGED   Details  acetaminophen (TYLENOL) 500 MG tablet Take 500 mg by mouth every 6 (six) hours as needed.    aspirin 81  MG tablet Take 81 mg by mouth daily.    atenolol (TENORMIN) 50 MG tablet Take 50 mg by mouth daily.    Calcium Carbonate Antacid (TUMS E-X PO) Take by mouth.    cholecalciferol (VITAMIN D) 1000 units tablet Take 2,000 Units by mouth daily.     clopidogrel (PLAVIX) 75 MG tablet Take 75 mg by mouth daily.    escitalopram (LEXAPRO) 10 MG  tablet Take 10 mg by mouth daily.    furosemide (LASIX) 20 MG tablet Take 20 mg by mouth daily as needed.     isosorbide dinitrate (ISORDIL) 30 MG tablet Take 30 mg by mouth 2 (two) times daily.     labetalol (NORMODYNE) 100 MG tablet Take 100 mg by mouth 2 (two) times daily.    Multiple Vitamins-Minerals (CENTRUM PO) Take 1 tablet by mouth daily.     nitroGLYCERIN (NITROSTAT) 0.4 MG SL tablet Place 0.4 mg under the tongue every 5 (five) minutes as needed for chest pain.    quinapril (ACCUPRIL) 20 MG tablet Take 20 mg by mouth daily.          DISCHARGE INSTRUCTIONS:   Follow-up with primary care physician at the facility in 3-4 days or sooner as needed Follow-up with orthopedics Dr. Derryl HarborBower in 3-4 weeks Continue physical therapy at rehabilitation Center  DIET:  Cardiac diet  DISCHARGE CONDITION:  Stable  ACTIVITY:  Activity as tolerated per PT  OXYGEN:  Home Oxygen: No.   Oxygen Delivery: room air  DISCHARGE LOCATION:  nursing home   If you experience worsening of your admission symptoms, develop shortness of breath, life threatening emergency, suicidal or homicidal thoughts you must seek medical attention immediately by calling 911 or calling your MD immediately  if symptoms less severe.  You Must read complete instructions/literature along with all the possible adverse reactions/side effects for all the Medicines you take and that have been prescribed to you. Take any new Medicines after you have completely understood and accpet all the possible adverse reactions/side effects.   Please note  You were cared for by a hospitalist during your hospital stay. If you have any questions about your discharge medications or the care you received while you were in the hospital after you are discharged, you can call the unit and asked to speak with the hospitalist on call if the hospitalist that took care of you is not available. Once you are discharged, your primary care physician  will handle any further medical issues. Please note that NO REFILLS for any discharge medications will be authorized once you are discharged, as it is imperative that you return to your primary care physician (or establish a relationship with a primary care physician if you do not have one) for your aftercare needs so that they can reassess your need for medications and monitor your lab values.     Today  Chief Complaint  Patient presents with  . Fall  . Hip Pain   Patient is doing much better with current pain management, agreeable to go to rehabilitation center  ROS:  CONSTITUTIONAL: Denies fevers, chills. Denies any fatigue, weakness.  EYES: Denies blurry vision, double vision, eye pain. EARS, NOSE, THROAT: Denies tinnitus, ear pain, hearing loss. RESPIRATORY: Denies cough, wheeze, shortness of breath.  CARDIOVASCULAR: Denies chest pain, palpitations, edema.  GASTROINTESTINAL: Denies nausea, vomiting, diarrhea, abdominal pain. Denies bright red blood per rectum. GENITOURINARY: Denies dysuria, hematuria. ENDOCRINE: Denies nocturia or thyroid problems. HEMATOLOGIC AND LYMPHATIC: Denies easy bruising or bleeding. SKIN: Denies  rash or lesion. MUSCULOSKELETAL: Pelvic pain and hip pain with movements  NEUROLOGIC: Denies paralysis, paresthesias.  PSYCHIATRIC: Denies anxiety or depressive symptoms.   VITAL SIGNS:  Blood pressure (!) 158/56, pulse 70, temperature 98.9 F (37.2 C), temperature source Oral, resp. rate 18, height 5\' 3"  (1.6 m), weight 83.3 kg (183 lb 9.6 oz), SpO2 96 %.  I/O:    Intake/Output Summary (Last 24 hours) at 11/28/16 1340 Last data filed at 11/28/16 1319  Gross per 24 hour  Intake              600 ml  Output              950 ml  Net             -350 ml    PHYSICAL EXAMINATION:  GENERAL:  81 y.o.-year-old patient lying in the bed with no acute distress.  EYES: Pupils equal, round, reactive to light and accommodation. No scleral icterus. Extraocular  muscles intact.  HEENT: Head atraumatic, normocephalic. Oropharynx and nasopharynx clear.  NECK:  Supple, no jugular venous distention. No thyroid enlargement, no tenderness.  LUNGS: Normal breath sounds bilaterally, no wheezing, rales,rhonchi or crepitation. No use of accessory muscles of respiration.  CARDIOVASCULAR: S1, S2 normal. No murmurs, rubs, or gallops.  ABDOMEN: Soft, non-tender, non-distended. Bowel sounds present. No organomegaly or mass.  EXTREMITIES:Hip is tender from pelvic fracture No pedal edema, cyanosis, or clubbing.  NEUROLOGIC: Cranial nerves II through XII are intact. Gait not checked  PSYCHIATRIC: The patient is alert and oriented x 3.  SKIN: No obvious rash, lesion, or ulcer.   DATA REVIEW:   CBC  Recent Labs Lab 11/26/16 0309  WBC 8.9  HGB 12.7  HCT 36.9  PLT 199    Chemistries   Recent Labs Lab 11/26/16 0309  NA 136  K 3.9  CL 107  CO2 25  GLUCOSE 118*  BUN 18  CREATININE 0.93  CALCIUM 8.9    Cardiac Enzymes No results for input(s): TROPONINI in the last 168 hours.  Microbiology Results  Results for orders placed or performed in visit on 12/27/11  Urine culture     Status: None   Collection Time: 12/27/11  7:14 PM  Result Value Ref Range Status   Micro Text Report   Final       SOURCE: CLEAN CATCH    COMMENT                   NO GROWTH IN 36 HOURS   ANTIBIOTIC                                                        RADIOLOGY:  Dg Pelvis 1-2 Views  Result Date: 11/25/2016 CLINICAL DATA:  Larey Seat today patient is in a lot of pain mostly on her right side of her buttocks to her hip. No previous EXAM: PELVIS - 1-2 VIEW COMPARISON:  None. FINDINGS: There are right hemipelvis fractures. There is a fracture across the right superior pubis from the superior pubic symphysis to the superior margin of the obturator ring. There is a transverse fracture of the inferior pubic ramus. These fractures are not significantly displaced. There is a  probable sagittal oriented fracture across the right sacrum adjacent to the right SI joint. No other fractures. Hip joints and  SI joints are normally aligned as is the symphysis pubis. Bones are demineralized. Soft tissues are unremarkable. IMPRESSION: 1. Nondisplaced fractures of the right superior inferior pubic rami. 2. Probable nondisplaced fracture of the right sacrum adjacent to the right SI joint. Electronically Signed   By: Amie Portland M.D.   On: 11/25/2016 13:45   Ct Lumbar Spine Wo Contrast  Result Date: 11/25/2016 CLINICAL DATA:  81 y/o  F; fall with back pain. EXAM: CT LUMBAR SPINE WITHOUT CONTRAST TECHNIQUE: Multidetector CT imaging of the lumbar spine was performed without intravenous contrast administration. Multiplanar CT image reconstructions were also generated. COMPARISON:  11/25/2016 pelvic radiographs. 11/25/2016 concurrent pelvic CT. FINDINGS: Segmentation: 5 lumbar type vertebrae. Alignment: Minimal grade 1 anterolisthesis at L5-S1. Vertebrae: No acute fracture or focal pathologic process. Paraspinal and other soft tissues: Extensive calcific atherosclerosis of the abdominal aorta. Disc levels: Mild loss of the L5-S1 intervertebral disc space and moderate lower lumbar facet arthrosis. IMPRESSION: 1. No acute fracture or dislocation of the lumbar spine identified. 2. Minimal grade 1 L5-S1 anterolisthesis and loss of disc space height. Prominent lower lumbar facet arthrosis. 3. Please refer to concurrent pelvic CT for further evaluation of the bony pelvis. Electronically Signed   By: Mitzi Hansen M.D.   On: 11/25/2016 15:13   Ct Pelvis Wo Contrast  Result Date: 11/25/2016 CLINICAL DATA:  Fall this morning.  Back and pelvic pain. EXAM: CT PELVIS WITHOUT CONTRAST TECHNIQUE: Multidetector CT imaging of the pelvis was performed following the standard protocol without intravenous contrast. COMPARISON:  Current pelvis radiographs. FINDINGS: Urinary Tract: Bladder is unremarkable.  There is mild stranding in the fat along the inferior margin of the bladder. Bowel:  Sigmoid colon diverticulosis.  No evidence of bowel injury. Vascular/Lymphatic: Aortic and iliac artery atherosclerotic calcifications. No aneurysm or evidence of a vascular injury. Reproductive:  Unremarkable. Other: There is significant atrophy of the right rectus abdominus muscle. No abdominal wall hernia. No pelvic free fluid. Musculoskeletal: As noted on current radiographs, there is a fracture of the right pubis extending to the superior pubic symphysis. Fracture is mildly comminuted, without significant displacement. There is a non comminuted nondisplaced transverse fracture of the inferior right pubic ramus. The fracture the right sacrum suggested on the radiographs remains not definitive. Small buckle of the anterior cortical margin of the right sacrum adjacent to the mid to inferior right SI joint suggests a nondisplaced fracture. There is a more definitive fracture line along the superior margin of the right iliac crest just lateral to the SI joint. No other evidence of fractures. No bone lesions. There are degenerative changes of the lower lumbar spine. Bones are demineralized. IMPRESSION: 1. Nondisplaced fractures of the right superior and inferior pubic rami as described. 2. Probable fracture along the right anterior sacrum, not well-defined by CT or radiographs. Subtle nondisplaced fracture along the medial superior right iliac crest. No other fractures. No dislocation. Electronically Signed   By: Amie Portland M.D.   On: 11/25/2016 15:15    EKG:   Orders placed or performed in visit on 02/12/12  . EKG 12-Lead  . EKG 12-Lead      Management plans discussed with the patient, family and they are in agreement.  CODE STATUS:     Code Status Orders        Start     Ordered   11/25/16 1711  Full code  Continuous     11/25/16 1710    Code Status History    Date Active  Date Inactive Code Status Order  ID Comments User Context   This patient has a current code status but no historical code status.    Advance Directive Documentation     Most Recent Value  Type of Advance Directive  Healthcare Power of Attorney, Living will, Out of facility DNR (pink MOST or yellow form)  Pre-existing out of facility DNR order (yellow form or pink MOST form)  -  "MOST" Form in Place?  -      TOTAL TIME TAKING CARE OF THIS PATIENT: 43 minutes.   Note: This dictation was prepared with Dragon dictation along with smaller phrase technology. Any transcriptional errors that result from this process are unintentional.   @MEC @  on 11/28/2016 at 1:40 PM  Between 7am to 6pm - Pager - [phone]  After 6pm go to [email or web address] - password [email or web address] [facility]  [place] [place] [facility]  Office  [phone]  CC: Primary care physician; Dorothey Baseman, MD

## 2016-11-28 NOTE — Progress Notes (Signed)
Clinical Education officer, museum (CSW) met with patient and her husband Deyonna Fitzsimmons and explained observation status and that [facility] could take a few days to make a decision about paying for SNF. Patient doesn't want to risk getting a hospital bill under observation and is willing to go to [facility] today under a 5 day LOG for pending [facility] SNF authorization. Per [facility] admissions coordinator at [facility] patient can come today under a 5 day LOG and she will start [employer] authorization. CSW Mudlogger approved 5 day LOG. Patient and her husband understand that if [facility] denies SNF then she will have to discharge from [facility] after 5 days.   Patient is medically stable for D/C to [facility] today. Patient will go to room 405. RN will call report to 400 hall RN and arrange EMS for transport. CSW sent D/C orders to [facility] via [place]. Patient and her husband are aware of above. Please reconsult if future social work needs arise. CSW signing off.   [employer], LCSW [phone]

## 2016-11-28 NOTE — Clinical Social Work Placement (Signed)
   CLINICAL SOCIAL WORK PLACEMENT  NOTE  Date:  11/28/2016  Patient Details  Name: Jane [name] MRN: [id number] Date of Birth: 05/06/1936  Clinical Social Work is seeking post-discharge placement for this patient at the Skilled  Nursing Facility level of care (*CSW will initial, date and re-position this form in  chart as items are completed):  Yes   Patient/family provided with [facility] Clinical Social Work Department's list of facilities offering this level of care within the geographic area requested by the patient (or if unable, by the patient's family).  Yes   Patient/family informed of their freedom to choose among providers that offer the needed level of care, that participate in Medicare, Medicaid or managed care program needed by the patient, have an available bed and are willing to accept the patient.  Yes   Patient/family informed of 's ownership interest in [facility] and [facility], as well as of the fact that they are under no obligation to receive care at these facilities.  PASRR submitted to EDS on 11/26/16     PASRR number received on 11/26/16     Existing PASRR number confirmed on       FL2 transmitted to all facilities in geographic area requested by pt/family on 11/26/16     FL2 transmitted to all facilities within larger geographic area on       Patient informed that his/her managed care company has contracts with or will negotiate with certain facilities, including the following:        Yes   Patient/family informed of bed offers received.  Patient chooses bed at  [facility] )     Physician recommends and patient chooses bed at      Patient to be transferred to  [facility] ) on 11/28/16.  Patient to be transferred to facility by  [facility] )     Patient family notified on 11/28/16 of transfer.  Name of family member notified:   (Patient's husband Lamount CrankerJerry Scullin is at bedside and aware of D/C today. )      PHYSICIAN       Additional Comment:    _______________________________________________ Shlomie Romig, Darleen CrockerBailey M, LCSW 11/28/2016, 3:48 PM

## 2017-03-06 ENCOUNTER — Other Ambulatory Visit: Payer: Self-pay | Admitting: Family Medicine

## 2017-03-06 DIAGNOSIS — Z1231 Encounter for screening mammogram for malignant neoplasm of breast: Secondary | ICD-10-CM

## 2017-05-18 ENCOUNTER — Ambulatory Visit
Admission: RE | Admit: 2017-05-18 | Discharge: 2017-05-18 | Disposition: A | Discharge status: Home / Self Care | Payer: Medicare HMO | Source: Ambulatory Visit | Attending: Family Medicine | Admitting: Family Medicine

## 2017-05-18 DIAGNOSIS — Z1231 Encounter for screening mammogram for malignant neoplasm of breast: Secondary | ICD-10-CM | POA: Insufficient documentation

## 2017-06-13 ENCOUNTER — Emergency Department
Admission: EM | Admit: 2017-06-13 | Discharge: 2017-06-13 | Disposition: A | Discharge status: Home / Self Care | Payer: Medicare HMO | Attending: Emergency Medicine | Admitting: Emergency Medicine

## 2017-06-13 ENCOUNTER — Other Ambulatory Visit: Payer: Self-pay

## 2017-06-13 ENCOUNTER — Emergency Department: Payer: Medicare HMO

## 2017-06-13 DIAGNOSIS — Z7901 Long term (current) use of anticoagulants: Secondary | ICD-10-CM | POA: Insufficient documentation

## 2017-06-13 DIAGNOSIS — R197 Diarrhea, unspecified: Secondary | ICD-10-CM

## 2017-06-13 DIAGNOSIS — Z79899 Other long term (current) drug therapy: Secondary | ICD-10-CM | POA: Insufficient documentation

## 2017-06-13 DIAGNOSIS — Z7982 Long term (current) use of aspirin: Secondary | ICD-10-CM | POA: Diagnosis not present

## 2017-06-13 DIAGNOSIS — R1084 Generalized abdominal pain: Secondary | ICD-10-CM

## 2017-06-13 DIAGNOSIS — I10 Essential (primary) hypertension: Secondary | ICD-10-CM | POA: Insufficient documentation

## 2017-06-13 DIAGNOSIS — Z87891 Personal history of nicotine dependence: Secondary | ICD-10-CM | POA: Insufficient documentation

## 2017-06-13 LAB — URINALYSIS, COMPLETE (UACMP) WITH MICROSCOPIC
Bacteria, UA: NONE SEEN
Bilirubin Urine: NEGATIVE
GLUCOSE, UA: NEGATIVE mg/dL
Ketones, ur: 20 mg/dL — AB
Nitrite: NEGATIVE
PH: 5 (ref 5.0–8.0)
PROTEIN: NEGATIVE mg/dL
Specific Gravity, Urine: 1.025 (ref 1.005–1.030)
Squamous Epithelial / LPF: NONE SEEN

## 2017-06-13 LAB — C DIFFICILE QUICK SCREEN W PCR REFLEX
C DIFFICILE (CDIFF) TOXIN: NEGATIVE
C Diff antigen: NEGATIVE
C Diff interpretation: NOT DETECTED

## 2017-06-13 LAB — GASTROINTESTINAL PANEL BY PCR, STOOL (REPLACES STOOL CULTURE)
Adenovirus F40/41: NOT DETECTED
Astrovirus: NOT DETECTED
Campylobacter species: NOT DETECTED
Cryptosporidium: NOT DETECTED
Cyclospora cayetanensis: NOT DETECTED
ENTAMOEBA HISTOLYTICA: NOT DETECTED
ENTEROAGGREGATIVE E COLI (EAEC): NOT DETECTED
Enteropathogenic E coli (EPEC): NOT DETECTED
Enterotoxigenic E coli (ETEC): NOT DETECTED
GIARDIA LAMBLIA: NOT DETECTED
Norovirus GI/GII: NOT DETECTED
Plesimonas shigelloides: NOT DETECTED
ROTAVIRUS A: NOT DETECTED
SAPOVIRUS (I, II, IV, AND V): NOT DETECTED
SHIGA LIKE TOXIN PRODUCING E COLI (STEC): NOT DETECTED
SHIGELLA/ENTEROINVASIVE E COLI (EIEC): NOT DETECTED
Salmonella species: NOT DETECTED
Vibrio cholerae: NOT DETECTED
Vibrio species: NOT DETECTED
YERSINIA ENTEROCOLITICA: NOT DETECTED

## 2017-06-13 LAB — CBC
HCT: 37.8 % (ref 35.0–47.0)
Hemoglobin: 12.5 g/dL (ref 12.0–16.0)
MCH: 31.9 pg (ref 26.0–34.0)
MCHC: 32.9 g/dL (ref 32.0–36.0)
MCV: 96.8 fL (ref 80.0–100.0)
PLATELETS: 229 10*3/uL (ref 150–440)
RBC: 3.9 MIL/uL (ref 3.80–5.20)
RDW: 13.4 % (ref 11.5–14.5)
WBC: 10.5 10*3/uL (ref 3.6–11.0)

## 2017-06-13 LAB — LIPASE, BLOOD: Lipase: 29 U/L (ref 11–51)

## 2017-06-13 LAB — COMPREHENSIVE METABOLIC PANEL
ALBUMIN: 3.9 g/dL (ref 3.5–5.0)
ALK PHOS: 86 U/L (ref 38–126)
ALT: 170 U/L — AB (ref 14–54)
AST: 109 U/L — ABNORMAL HIGH (ref 15–41)
Anion gap: 11 (ref 5–15)
BUN: 18 mg/dL (ref 6–20)
CALCIUM: 9.5 mg/dL (ref 8.9–10.3)
CO2: 21 mmol/L — AB (ref 22–32)
Chloride: 105 mmol/L (ref 101–111)
Creatinine, Ser: 1.03 mg/dL — ABNORMAL HIGH (ref 0.44–1.00)
GFR calc non Af Amer: 50 mL/min — ABNORMAL LOW (ref >60)
GFR, EST AFRICAN AMERICAN: 58 mL/min — AB (ref >60)
GLUCOSE: 101 mg/dL — AB (ref 65–99)
Potassium: 4 mmol/L (ref 3.5–5.1)
SODIUM: 137 mmol/L (ref 135–145)
Total Bilirubin: 1.2 mg/dL (ref 0.3–1.2)
Total Protein: 6.9 g/dL (ref 6.5–8.1)

## 2017-06-13 MED ORDER — METRONIDAZOLE 500 MG PO TABS: 500.0000 mg | ORAL_TABLET | Freq: Three times a day (TID) | ORAL | 0 refills | Status: DC

## 2017-06-13 MED ORDER — SODIUM CHLORIDE 0.9 % IV BOLUS (SEPSIS)
1000.0000 mL | Freq: Once | INTRAVENOUS | Status: AC
  Administered 2017-06-13: 1000 mL via INTRAVENOUS

## 2017-06-13 MED ORDER — IOPAMIDOL (ISOVUE-300) INJECTION 61%
30.0000 mL | Freq: Once | INTRAVENOUS | Status: AC | PRN
  Administered 2017-06-13: 30 mL via ORAL

## 2017-06-13 MED ORDER — DEXTROSE-NACL 5-0.45 % IV SOLN
1000.0000 mL | Freq: Once | INTRAVENOUS | Status: AC
  Administered 2017-06-13: 1000 mL via INTRAVENOUS

## 2017-06-13 MED ORDER — DEXTROSE-NACL 5-0.9 % IV SOLN: 1000.0000 mL | Freq: Once | INTRAVENOUS | Status: DC

## 2017-06-13 MED ORDER — IOPAMIDOL (ISOVUE-300) INJECTION 61%
100.0000 mL | Freq: Once | INTRAVENOUS | Status: AC | PRN
  Administered 2017-06-13: 100 mL via INTRAVENOUS

## 2017-06-13 MED ORDER — CIPROFLOXACIN HCL 500 MG PO TABS: 500.0000 mg | ORAL_TABLET | Freq: Two times a day (BID) | ORAL | 0 refills | Status: DC

## 2017-06-13 NOTE — ED Notes (Signed)
Pt assisted to toilet to void 

## 2017-06-13 NOTE — ED Triage Notes (Signed)
Diarrhea X 1 week. Multiple times per day. Pt had bloodwork done at PCP today. Denies NV. Pt alert and oriented X4, active, cooperative, pt in NAD. RR even and unlabored, color WNL.

## 2017-06-13 NOTE — ED Provider Notes (Addendum)
[facility] Emergency Department Provider Note  ____________________________________________  Time seen: Approximately 10:45 AM  I have reviewed the triage vital signs and the nursing notes.   HISTORY  Chief Complaint Diarrhea    HPI Jane [name] is a 82 y.o. female who complains of intermittent generalized aching abdominal pain, nonradiating, no aggravating or alleviating factors, associated with watery diarrhea for the past week it is precipitated every time she tries to drink any fluids. She's not been able to eat for the past week and has loss of appetite. No fevers chills or sweats. She's tried Pepto and Imodium without relief. She feels lightheaded whenever she stands up. No syncope. No chest pain shortness of breath or cough. No vomiting. No black or bloody stool. My answer when she had diverticulitis in the past. She has previously had a liver hemangioma resection as well as cholecystectomy.     Past Medical History:  Diagnosis Date  . Diverticulitis   . Hiatal hernia   . Hyperlipidemia   . Hypertension   . Lymphocytic colitis   . Mitral valve prolapse   . Osteoporosis   . Reflux      Patient Active Problem List   Diagnosis Date Noted  . Pelvic fracture (HCC) 11/25/2016     Past Surgical History:  Procedure Laterality Date  . APPENDECTOMY    . CARDIAC CATHETERIZATION    . CATARACT EXTRACTION    . CERVICAL DISC SURGERY    . CHOLECYSTECTOMY    . CORONARY STENT PLACEMENT    . FOOT FRACTURE SURGERY    . hemangioma    . TONSILLECTOMY    . TUBAL LIGATION       Prior to Admission medications   Medication Sig Start Date End Date Taking? Authorizing Provider  acetaminophen (TYLENOL) 500 MG tablet Take 500 mg by mouth every 6 (six) hours as needed.    [provider]  ALPRAZolam Prudy Feeler(XANAX) 0.5 MG tablet Take 1 tablet (0.5 mg total) by mouth at bedtime as needed for sleep. 11/28/16   Ramonita LabGouru, Aruna, MD  aspirin 81 MG tablet Take 81  mg by mouth daily.    [provider]  atenolol (TENORMIN) 50 MG tablet Take 50 mg by mouth daily. 09/30/16   [provider]  Calcium Carbonate Antacid (TUMS E-X PO) Take by mouth.    [provider]  cholecalciferol (VITAMIN D) 1000 units tablet Take 2,000 Units by mouth daily.     [provider]  ciprofloxacin (CIPRO) 500 MG tablet Take 1 tablet (500 mg total) by mouth 2 (two) times daily. 06/13/17   Sharman CheekStafford, Ryder Man, MD  clopidogrel (PLAVIX) 75 MG tablet Take 75 mg by mouth daily.    [provider]  escitalopram (LEXAPRO) 10 MG tablet Take 10 mg by mouth daily.    [provider]  furosemide (LASIX) 20 MG tablet Take 20 mg by mouth daily as needed.     [provider]  isosorbide dinitrate (ISORDIL) 30 MG tablet Take 30 mg by mouth 2 (two) times daily.     [provider]  labetalol (NORMODYNE) 100 MG tablet Take 100 mg by mouth 2 (two) times daily.    [provider]  metroNIDAZOLE (FLAGYL) 500 MG tablet Take 1 tablet (500 mg total) by mouth 3 (three) times daily. 06/13/17   Sharman CheekStafford, Albertia Carvin, MD  Multiple Vitamins-Minerals (CENTRUM PO) Take 1 tablet by mouth daily.     [provider]  nitroGLYCERIN (NITROSTAT) 0.4 MG SL  tablet Place 0.4 mg under the tongue every 5 (five) minutes as needed for chest pain.    [provider]  ondansetron (ZOFRAN) 4 MG tablet Take 1 tablet (4 mg total) by mouth every 6 (six) hours as needed for nausea. 11/28/16   Ramonita Lab, MD  oxyCODONE-acetaminophen (PERCOCET/ROXICET) 5-325 MG tablet Take 1 tablet by mouth every 4 (four) hours as needed for moderate pain. 11/28/16   Gouru, Deanna Artis, MD  quinapril (ACCUPRIL) 20 MG tablet Take 20 mg by mouth daily.     [provider]     Allergies Adhesive [tape] and Codeine   Family History  Problem Relation Age of Onset  . Breast cancer Paternal Aunt   . Diabetes Mother   . Hypertension Mother   . Heart attack  Mother     Social History Social History   Tobacco Use  . Smoking status: Former Smoker    Types: Cigarettes  . Smokeless tobacco: Never Used  Substance Use Topics  . Alcohol use: No  . Drug use: No    Review of Systems  Constitutional:   No fever or chills.  ENT:   No sore throat. No rhinorrhea. Cardiovascular:   No chest pain or syncope. Respiratory:   No dyspnea or cough. Gastrointestinal:   Positive as above for abdominal pain and diarrhea.  Musculoskeletal:   Negative for focal pain or swelling All other systems reviewed and are negative except as documented above in ROS and HPI.  ____________________________________________   PHYSICAL EXAM:  VITAL SIGNS: ED Triage Vitals  Enc Vitals Group     BP 06/13/17 1008 (!) 91/43     Pulse Rate 06/13/17 1008 71     Resp 06/13/17 1008 16     Temp 06/13/17 1008 98.3 F (36.8 C)     Temp Source 06/13/17 1008 Oral     SpO2 06/13/17 1008 100 %     Weight 06/13/17 1008 165 lb (74.8 kg)     Height 06/13/17 1008 5\' 3"  (1.6 m)     Head Circumference --      Peak Flow --      Pain Score 06/13/17 1007 7     Pain Loc --      Pain Edu? --      Excl. in GC? --     Vital signs reviewed, nursing assessments reviewed.   Constitutional:   Alert and oriented. Well appearing and in no distress. Eyes:   No scleral icterus.  EOMI. No nystagmus. No conjunctival pallor. PERRL. ENT   Head:   Normocephalic and atraumatic.   Nose:   No congestion/rhinnorhea.    Mouth/Throat:   MMM, no pharyngeal erythema. No peritonsillar mass.    Neck:   No meningismus. Full ROM. Hematological/Lymphatic/Immunilogical:   No cervical lymphadenopathy. Cardiovascular:   RRR. Symmetric bilateral radial and DP pulses.  No murmurs.  Respiratory:   Normal respiratory effort without tachypnea/retractions. Breath sounds are clear and equal bilaterally. No wheezes/rales/rhonchi. Gastrointestinal:   Soft with generalized tenderness. Non distended.  There is no CVA tenderness.  No rebound, rigidity, or guarding. Genitourinary:   deferred Musculoskeletal:   Normal range of motion in all extremities. No joint effusions.  No lower extremity tenderness.  No edema. Neurologic:   Normal speech and language.  Motor grossly intact. No gross focal neurologic deficits are appreciated.  Skin:    Skin is warm, dry and intact. No rash noted.  No petechiae, purpura, or bullae.  ____________________________________________    LABS (  pertinent positives/negatives) (all labs ordered are listed, but only abnormal results are displayed) Labs Reviewed  COMPREHENSIVE METABOLIC PANEL - Abnormal; Notable for the following components:      Result Value   CO2 21 (*)    Glucose, Bld 101 (*)    Creatinine, Ser 1.03 (*)    AST 109 (*)    ALT 170 (*)    GFR calc non Af Amer 50 (*)    GFR calc Af Amer 58 (*)    All other components within normal limits  URINALYSIS, COMPLETE (UACMP) WITH MICROSCOPIC - Abnormal; Notable for the following components:   Color, Urine YELLOW (*)    APPearance CLEAR (*)    Hgb urine dipstick SMALL (*)    Ketones, ur 20 (*)    Leukocytes, UA TRACE (*)    All other components within normal limits  C DIFFICILE QUICK SCREEN W PCR REFLEX  GASTROINTESTINAL PANEL BY PCR, STOOL (REPLACES STOOL CULTURE)  LIPASE, BLOOD  CBC   ____________________________________________   EKG  Interpreted by me Normal sinus rhythm rate of 73, normal axis intervals QRS ST segments. Isolated T-wave inversion in lead 3 which is nonspecific.  ____________________________________________    RADIOLOGY  Ct Abdomen Pelvis W Contrast  Result Date: 06/13/2017 CLINICAL DATA:  Diarrhea for 1 week. History of cholecystectomy and appendectomy. History of resected hemangioma. EXAM: CT ABDOMEN AND PELVIS WITH CONTRAST TECHNIQUE: Multidetector CT imaging of the abdomen and pelvis was performed using the standard protocol following bolus administration of  intravenous contrast. CONTRAST:  ISOVUE-300 IOPAMIDOL (ISOVUE-300) INJECTION 61% COMPARISON:  11/25/2016 CT pelvis. FINDINGS: Lower chest: No significant pulmonary nodules or acute consolidative airspace disease. Oral contrast is noted in the lower thoracic esophagus. Coronary atherosclerosis. Hepatobiliary: Postsurgical changes are noted in the right liver lobe with surgical sutures and multiple surgical clips throughout the anterior right liver lobe with associated volume loss in the right liver lobe and relative hypertrophy of left liver lobe. Nonspecific contour irregularity in the right liver lobe, which may be postsurgical. Cholecystectomy. Bile ducts are within normal post cholecystectomy limits. Common bile duct diameter 6 mm. Pancreas: Normal, with no mass or duct dilation. Spleen: Normal size. No mass. Adrenals/Urinary Tract: No discrete adrenal nodules. No hydronephrosis. Mild renal cortical scarring in lateral lower right kidney. Several subcentimeter hypodense renal cortical lesions in the left greater than right kidneys, too small to characterize, which require no follow-up. Normal bladder. Stomach/Bowel: Normal non-distended stomach. Normal caliber small bowel with no small bowel wall thickening. Oral contrast transits to right colon. Appendectomy. Fluid levels are present throughout the large bowel and rectum, without large bowel wall thickening or pericolonic fat stranding. Mild sigmoid diverticulosis. Vascular/Lymphatic: Atherosclerotic nonaneurysmal abdominal aorta. Patent portal, splenic, hepatic and renal veins. No pathologically enlarged lymph nodes in the abdomen or pelvis. Reproductive: Grossly normal uterus.  No adnexal mass. Other: No pneumoperitoneum, ascites or focal fluid collection. Musculoskeletal: No aggressive appearing focal osseous lesions. Nearly healed superior and inferior right pubic ramus and right sacral fractures. Severe T11 vertebral compression fracture of  indeterminate chronicity, chronic appearing. IMPRESSION: 1. Fluid levels throughout the large bowel and rectum, indicative of a nonspecific malabsorptive/diarrheal state. No bowel obstruction. No bowel wall thickening. 2. Mild sigmoid diverticulosis, with no evidence of acute diverticulitis. 3. Postsurgical changes from partial right hepatectomy with expected relative hypertrophy of the left liver lobe. 4. Oral contrast in the lower thoracic esophagus, suggesting esophageal dysmotility and/or gastroesophageal reflux. 5. Nearly healed right pelvic fractures. 6.  Aortic  Atherosclerosis (ICD10-I70.0).  Coronary atherosclerosis. Electronically Signed   By: Delbert Phenix M.D.   On: 06/13/2017 12:07    ____________________________________________   PROCEDURES Procedures  ____________________________________________ DIFFERENTIAL DIAGNOSIS Appendicitis, diverticulitis, intra-abdominal abscess, bowel perforation, infectious diarrhea   CLINICAL IMPRESSION / ASSESSMENT AND PLAN / ED COURSE  Pertinent labs & imaging results that were available during my care of the patient were reviewed by me and considered in my medical decision making (see chart for details).   Patient well-appearing no acute distress, clinically dehydrated likely due to ongoing enteric losses from subacute diarrhea. Labs unremarkable, we'll proceed with CT scan for further evaluation. IV fluids for hydration.  Clinical Course as of Jun 13 1518  Tue Jun 13, 2017  1235 CT shows findings of diarrhea but no evidence of bowel obstruction or colitis, diverticulitis, abscess, or appendicitis. Will check stool studies.  [PS]    Clinical Course User Index [PS] Sharman Cheek, MD    ----------------------------------------- 3:20 PM on 06/13/2017 -----------------------------------------  Patient feels better, vital signs remained stable, tolerating oral intake. C. difficile negative. Workup negative. Discharge home on course of  Cipro and Flagyl, follow up with primary care. Return precautions.   ____________________________________________   FINAL CLINICAL IMPRESSION(S) / ED DIAGNOSES    Final diagnoses:  Diarrhea of presumed infectious origin  Generalized abdominal pain       Portions of this note were generated with dragon dictation software. Dictation errors may occur despite best attempts at proofreading.    Sharman Cheek, MD 06/13/17 1521    Sharman Cheek, MD 06/13/17 [phone]

## 2017-06-13 NOTE — ED Notes (Signed)
CT notified that pt has finished drinking contrast 

## 2017-06-13 NOTE — ED Notes (Signed)
Pt informed that stool sample needed to be obtained the next time she went to the bathroom

## 2017-06-13 NOTE — ED Notes (Signed)
Pt assisted to bedside toilet pt had large watery stool

## 2017-12-08 ENCOUNTER — Observation Stay
Admission: EM | Admit: 2017-12-08 | Discharge: 2017-12-09 | Disposition: A | Discharge status: Home / Self Care | Payer: Medicare HMO | Attending: Internal Medicine | Admitting: Internal Medicine

## 2017-12-08 ENCOUNTER — Emergency Department: Payer: Medicare HMO

## 2017-12-08 ENCOUNTER — Other Ambulatory Visit: Payer: Self-pay

## 2017-12-08 ENCOUNTER — Encounter: Payer: Self-pay | Admitting: Emergency Medicine

## 2017-12-08 DIAGNOSIS — M810 Age-related osteoporosis without current pathological fracture: Secondary | ICD-10-CM | POA: Insufficient documentation

## 2017-12-08 DIAGNOSIS — I10 Essential (primary) hypertension: Secondary | ICD-10-CM | POA: Diagnosis not present

## 2017-12-08 DIAGNOSIS — Z79899 Other long term (current) drug therapy: Secondary | ICD-10-CM | POA: Diagnosis not present

## 2017-12-08 DIAGNOSIS — I341 Nonrheumatic mitral (valve) prolapse: Secondary | ICD-10-CM | POA: Insufficient documentation

## 2017-12-08 DIAGNOSIS — E785 Hyperlipidemia, unspecified: Secondary | ICD-10-CM | POA: Diagnosis not present

## 2017-12-08 DIAGNOSIS — Z7982 Long term (current) use of aspirin: Secondary | ICD-10-CM | POA: Insufficient documentation

## 2017-12-08 DIAGNOSIS — Z888 Allergy status to other drugs, medicaments and biological substances status: Secondary | ICD-10-CM | POA: Diagnosis not present

## 2017-12-08 DIAGNOSIS — N390 Urinary tract infection, site not specified: Secondary | ICD-10-CM | POA: Insufficient documentation

## 2017-12-08 DIAGNOSIS — Z885 Allergy status to narcotic agent status: Secondary | ICD-10-CM | POA: Insufficient documentation

## 2017-12-08 DIAGNOSIS — Z9049 Acquired absence of other specified parts of digestive tract: Secondary | ICD-10-CM | POA: Insufficient documentation

## 2017-12-08 DIAGNOSIS — I25110 Atherosclerotic heart disease of native coronary artery with unstable angina pectoris: Principal | ICD-10-CM | POA: Insufficient documentation

## 2017-12-08 DIAGNOSIS — R072 Precordial pain: Secondary | ICD-10-CM

## 2017-12-08 DIAGNOSIS — Z87891 Personal history of nicotine dependence: Secondary | ICD-10-CM | POA: Insufficient documentation

## 2017-12-08 DIAGNOSIS — Z955 Presence of coronary angioplasty implant and graft: Secondary | ICD-10-CM | POA: Insufficient documentation

## 2017-12-08 DIAGNOSIS — Z9851 Tubal ligation status: Secondary | ICD-10-CM | POA: Insufficient documentation

## 2017-12-08 DIAGNOSIS — Z9889 Other specified postprocedural states: Secondary | ICD-10-CM | POA: Insufficient documentation

## 2017-12-08 DIAGNOSIS — I200 Unstable angina: Secondary | ICD-10-CM | POA: Diagnosis present

## 2017-12-08 DIAGNOSIS — Z9849 Cataract extraction status, unspecified eye: Secondary | ICD-10-CM | POA: Insufficient documentation

## 2017-12-08 DIAGNOSIS — K219 Gastro-esophageal reflux disease without esophagitis: Secondary | ICD-10-CM | POA: Insufficient documentation

## 2017-12-08 DIAGNOSIS — I160 Hypertensive urgency: Secondary | ICD-10-CM | POA: Diagnosis present

## 2017-12-08 DIAGNOSIS — F329 Major depressive disorder, single episode, unspecified: Secondary | ICD-10-CM | POA: Diagnosis not present

## 2017-12-08 DIAGNOSIS — Z8249 Family history of ischemic heart disease and other diseases of the circulatory system: Secondary | ICD-10-CM | POA: Insufficient documentation

## 2017-12-08 LAB — BASIC METABOLIC PANEL
Anion gap: 9 (ref 5–15)
BUN: 16 mg/dL (ref 8–23)
CALCIUM: 9.6 mg/dL (ref 8.9–10.3)
CO2: 25 mmol/L (ref 22–32)
CREATININE: 0.7 mg/dL (ref 0.44–1.00)
Chloride: 106 mmol/L (ref 98–111)
Glucose, Bld: 111 mg/dL — ABNORMAL HIGH (ref 70–99)
Potassium: 3.9 mmol/L (ref 3.5–5.1)
SODIUM: 140 mmol/L (ref 135–145)

## 2017-12-08 LAB — CBC
HCT: 41.4 % (ref 35.0–47.0)
Hemoglobin: 13.9 g/dL (ref 12.0–16.0)
MCH: 32.5 pg (ref 26.0–34.0)
MCHC: 33.6 g/dL (ref 32.0–36.0)
MCV: 96.9 fL (ref 80.0–100.0)
PLATELETS: 250 10*3/uL (ref 150–440)
RBC: 4.28 MIL/uL (ref 3.80–5.20)
RDW: 13.2 % (ref 11.5–14.5)
WBC: 6.7 10*3/uL (ref 3.6–11.0)

## 2017-12-08 LAB — TROPONIN I: Troponin I: <0.03 ng/mL (ref <0.03)

## 2017-12-08 MED ORDER — VITAMIN D3 25 MCG (1000 UNIT) PO TABS
2000.0000 [IU] | ORAL_TABLET | Freq: Every day | ORAL | Status: DC
  Administered 2017-12-09: 2000 [IU] via ORAL
  Filled 2017-12-08: qty 2

## 2017-12-08 MED ORDER — ACETAMINOPHEN 325 MG PO TABS
650.0000 mg | ORAL_TABLET | Freq: Once | ORAL | Status: AC
Start: 2017-12-08 — End: 2017-12-08
  Administered 2017-12-08: 650 mg via ORAL

## 2017-12-08 MED ORDER — LABETALOL HCL 5 MG/ML IV SOLN
10.0000 mg | Freq: Once | INTRAVENOUS | Status: AC
  Administered 2017-12-08: 10 mg via INTRAVENOUS
  Filled 2017-12-08: qty 4

## 2017-12-08 MED ORDER — ALPRAZOLAM 0.5 MG PO TABS: 0.2500 mg | ORAL_TABLET | Freq: Two times a day (BID) | ORAL | Status: DC | PRN

## 2017-12-08 MED ORDER — NITROFURANTOIN MONOHYD MACRO 100 MG PO CAPS
100.0000 mg | ORAL_CAPSULE | Freq: Two times a day (BID) | ORAL | Status: DC
  Administered 2017-12-08 – 2017-12-09 (×2): 100 mg via ORAL
  Filled 2017-12-08 (×3): qty 1

## 2017-12-08 MED ORDER — ATENOLOL 50 MG PO TABS: 50.0000 mg | ORAL_TABLET | Freq: Every day | ORAL | Status: DC

## 2017-12-08 MED ORDER — ASPIRIN EC 81 MG PO TBEC
81.0000 mg | DELAYED_RELEASE_TABLET | Freq: Every day | ORAL | Status: DC
  Administered 2017-12-09: 81 mg via ORAL
  Filled 2017-12-08: qty 1

## 2017-12-08 MED ORDER — ASPIRIN EC 325 MG PO TBEC: 325.0000 mg | DELAYED_RELEASE_TABLET | Freq: Every day | ORAL | Status: DC

## 2017-12-08 MED ORDER — QUINAPRIL HCL 10 MG PO TABS
20.0000 mg | ORAL_TABLET | Freq: Every day | ORAL | Status: DC
  Administered 2017-12-09: 20 mg via ORAL
  Filled 2017-12-08: qty 2

## 2017-12-08 MED ORDER — CLOPIDOGREL BISULFATE 75 MG PO TABS
75.0000 mg | ORAL_TABLET | Freq: Every day | ORAL | Status: DC
  Administered 2017-12-09: 75 mg via ORAL
  Filled 2017-12-08: qty 1

## 2017-12-08 MED ORDER — CARVEDILOL 3.125 MG PO TABS
3.1250 mg | ORAL_TABLET | Freq: Two times a day (BID) | ORAL | Status: DC
  Administered 2017-12-08 – 2017-12-09 (×2): 3.125 mg via ORAL
  Filled 2017-12-08 (×2): qty 1

## 2017-12-08 MED ORDER — ISOSORBIDE DINITRATE 30 MG PO TABS
30.0000 mg | ORAL_TABLET | Freq: Two times a day (BID) | ORAL | Status: DC
Start: 2017-12-08 — End: 2017-12-09
  Administered 2017-12-08 – 2017-12-09 (×2): 30 mg via ORAL
  Filled 2017-12-08 (×3): qty 1

## 2017-12-08 MED ORDER — NITROGLYCERIN 0.4 MG SL SUBL: 0.4000 mg | SUBLINGUAL_TABLET | SUBLINGUAL | Status: DC | PRN

## 2017-12-08 MED ORDER — ENOXAPARIN SODIUM 40 MG/0.4ML ~~LOC~~ SOLN
40.0000 mg | SUBCUTANEOUS | Status: DC
  Administered 2017-12-08: 40 mg via SUBCUTANEOUS
  Filled 2017-12-08: qty 0.4

## 2017-12-08 MED ORDER — ONDANSETRON HCL 4 MG/2ML IJ SOLN: 4.0000 mg | Freq: Four times a day (QID) | INTRAMUSCULAR | Status: DC | PRN

## 2017-12-08 MED ORDER — NITROGLYCERIN 0.4 MG SL SUBL
0.4000 mg | SUBLINGUAL_TABLET | SUBLINGUAL | Status: DC | PRN
  Administered 2017-12-08 (×3): 0.4 mg via SUBLINGUAL

## 2017-12-08 MED ORDER — ACETAMINOPHEN 325 MG PO TABS
ORAL_TABLET | ORAL | Status: AC
  Administered 2017-12-08: 650 mg via ORAL
  Filled 2017-12-08: qty 2

## 2017-12-08 MED ORDER — ESCITALOPRAM OXALATE 10 MG PO TABS
10.0000 mg | ORAL_TABLET | Freq: Every day | ORAL | Status: DC
  Administered 2017-12-09: 10 mg via ORAL
  Filled 2017-12-08: qty 1

## 2017-12-08 MED ORDER — ACETAMINOPHEN 500 MG PO TABS: 500.0000 mg | ORAL_TABLET | Freq: Four times a day (QID) | ORAL | Status: DC | PRN

## 2017-12-08 MED ORDER — NITROGLYCERIN 0.4 MG SL SUBL
SUBLINGUAL_TABLET | SUBLINGUAL | Status: AC
  Administered 2017-12-08: 0.4 mg via SUBLINGUAL
  Filled 2017-12-08: qty 1

## 2017-12-08 MED ORDER — ACETAMINOPHEN 325 MG PO TABS: 650.0000 mg | ORAL_TABLET | ORAL | Status: DC | PRN

## 2017-12-08 MED ORDER — CALCIUM CARBONATE ANTACID 500 MG PO CHEW
2.0000 | CHEWABLE_TABLET | Freq: Every day | ORAL | Status: DC
  Administered 2017-12-09: 400 mg via ORAL
  Filled 2017-12-08: qty 2

## 2017-12-08 NOTE — ED Notes (Signed)
Patient reports she was taken off of 1 of her 2 BP medications on Tuesday. Patient reports that the pharmacy called her PCP and informed them it was dangerous to be on 2 beta blockers. Patient's other BP medication was not adjusted, nor was she started on another medication to replace the one she was taken off of.

## 2017-12-08 NOTE — ED Provider Notes (Signed)
[facility] Emergency Department Provider Note  ____________________________________________  Time seen: Approximately 7:41 PM  I have reviewed the triage vital signs and the nursing notes.   HISTORY  Chief Complaint Hypertension    HPI Jane [name] is a 82 y.o. female with a history of hypertension CAD status post stent 4 years ago by Dr. Juliann Pares complains of  chest pain starting around noon today described as pressure radiating to the back associated with shortness of breath and diaphoresis.  No vomiting.  No dizziness or syncope.  Worse with exertion, better with rest but mostly constant all day.  She also notes that she has had dyspnea on exertion for the past 3 days.  She is compliant with all her medications but was recently discontinued off of atenolol and has noticed that her blood pressure is much higher.  At its worst the pain is severe, currently resolved after receiving a nitroglycerin spray and nitroglycerin patch by EMS.     Past Medical History:  Diagnosis Date  . Diverticulitis   . Hiatal hernia   . Hyperlipidemia   . Hypertension   . Lymphocytic colitis   . Mitral valve prolapse   . Osteoporosis   . Reflux      Patient Active Problem List   Diagnosis Date Noted  . Pelvic fracture (HCC) 11/25/2016     Past Surgical History:  Procedure Laterality Date  . APPENDECTOMY    . CARDIAC CATHETERIZATION    . CATARACT EXTRACTION    . CERVICAL DISC SURGERY    . CHOLECYSTECTOMY    . CORONARY STENT PLACEMENT    . FOOT FRACTURE SURGERY    . hemangioma    . TONSILLECTOMY    . TUBAL LIGATION       Prior to Admission medications   Medication Sig Start Date End Date Taking? Authorizing Provider  acetaminophen (TYLENOL) 500 MG tablet Take 500 mg by mouth every 6 (six) hours as needed.    [provider]  ALPRAZolam Prudy Feeler) 0.5 MG tablet Take 1 tablet (0.5 mg total) by mouth at bedtime as needed for sleep. 11/28/16   Ramonita Lab, MD  aspirin 81 MG tablet Take 81 mg by mouth daily.    [provider]  atenolol (TENORMIN) 50 MG tablet Take 50 mg by mouth daily. 09/30/16   [provider]  Calcium Carbonate Antacid (TUMS E-X PO) Take by mouth.    [provider]  cholecalciferol (VITAMIN D) 1000 units tablet Take 2,000 Units by mouth daily.     [provider]  ciprofloxacin (CIPRO) 500 MG tablet Take 1 tablet (500 mg total) by mouth 2 (two) times daily. 06/13/17   Sharman Cheek, MD  clopidogrel (PLAVIX) 75 MG tablet Take 75 mg by mouth daily.    [provider]  escitalopram (LEXAPRO) 10 MG tablet Take 10 mg by mouth daily.    [provider]  furosemide (LASIX) 20 MG tablet Take 20 mg by mouth daily as needed.     [provider]  isosorbide dinitrate (ISORDIL) 30 MG tablet Take 30 mg by mouth 2 (two) times daily.     [provider]  labetalol (NORMODYNE) 100 MG tablet Take 100 mg by mouth 2 (two) times daily.    [provider]  metroNIDAZOLE (FLAGYL) 500 MG tablet Take 1 tablet (500 mg total) by mouth 3 (three) times daily. 06/13/17   Sharman Cheek, MD  Multiple Vitamins-Minerals (CENTRUM PO) Take 1 tablet by mouth  daily.     [provider]  nitroGLYCERIN (NITROSTAT) 0.4 MG SL tablet Place 0.4 mg under the tongue every 5 (five) minutes as needed for chest pain.    [provider]  ondansetron (ZOFRAN) 4 MG tablet Take 1 tablet (4 mg total) by mouth every 6 (six) hours as needed for nausea. 11/28/16   Ramonita LabGouru, Aruna, MD  oxyCODONE-acetaminophen (PERCOCET/ROXICET) 5-325 MG tablet Take 1 tablet by mouth every 4 (four) hours as needed for moderate pain. 11/28/16   Gouru, Deanna ArtisAruna, MD  quinapril (ACCUPRIL) 20 MG tablet Take 20 mg by mouth daily.     [provider]     Allergies Adhesive [tape] and Codeine   Family History  Problem Relation Age of Onset  . Breast cancer Paternal Aunt   . Diabetes Mother   .  Hypertension Mother   . Heart attack Mother     Social History Social History   Tobacco Use  . Smoking status: Former Smoker    Types: Cigarettes  . Smokeless tobacco: Never Used  Substance Use Topics  . Alcohol use: No  . Drug use: No    Review of Systems  Constitutional:   No fever or chills.  ENT:   No sore throat. No rhinorrhea. Cardiovascular:   Positive chest pain as above without syncope. Respiratory:   Positive shortness of breath without cough. Gastrointestinal:   Negative for abdominal pain, vomiting and diarrhea.  Musculoskeletal:   Negative for focal pain or swelling All other systems reviewed and are negative except as documented above in ROS and HPI.  ____________________________________________   PHYSICAL EXAM:  VITAL SIGNS: ED Triage Vitals [12/08/17 1825]  Enc Vitals Group     BP (!) 204/94     Pulse Rate 74     Resp 16     Temp 98.8 F (37.1 C)     Temp Source Oral     SpO2 98 %     Weight 175 lb (79.4 kg)     Height 5\' 3"  (1.6 m)     Head Circumference      Peak Flow      Pain Score 0     Pain Loc      Pain Edu?      Excl. in GC?     Vital signs reviewed, nursing assessments reviewed.   Constitutional:   Alert and oriented. Non-toxic appearance. Eyes:   Conjunctivae are normal. EOMI. PERRL. ENT      Head:   Normocephalic and atraumatic.      Nose:   No congestion/rhinnorhea.       Mouth/Throat:   MMM, no pharyngeal erythema. No peritonsillar mass.       Neck:   No meningismus. Full ROM. Hematological/Lymphatic/Immunilogical:   No cervical lymphadenopathy. Cardiovascular:   RRR. Symmetric bilateral radial and DP pulses.  No murmurs.  Respiratory:   Normal respiratory effort without tachypnea/retractions. Breath sounds are clear and equal bilaterally. No wheezes/rales/rhonchi. Gastrointestinal:   Soft and nontender. Non distended. There is no CVA tenderness.  No rebound, rigidity, or guarding. Musculoskeletal:   Normal range of motion  in all extremities. No joint effusions.  No lower extremity tenderness.  No edema. Neurologic:   Normal speech and language.  Motor grossly intact. No acute focal neurologic deficits are appreciated.  Skin:    Skin is warm, dry and intact. No rash noted.  No petechiae, purpura, or bullae.  ____________________________________________    LABS (pertinent positives/negatives) (all labs ordered are listed,  but only abnormal results are displayed) Labs Reviewed  BASIC METABOLIC PANEL - Abnormal; Notable for the following components:      Result Value   Glucose, Bld 111 (*)    All other components within normal limits  CBC  TROPONIN I   ____________________________________________   EKG  Interpreted by me  Date: 12/08/2017  Rate: 72  Rhythm: normal sinus rhythm  QRS Axis: normal  Intervals: normal  ST/T Wave abnormalities: normal  Conduction Disutrbances: none  Narrative Interpretation: unremarkable      ____________________________________________    RADIOLOGY  Dg Chest 2 View  Result Date: 12/08/2017 CLINICAL DATA:  Chest pressure EXAM: CHEST - 2 VIEW COMPARISON:  02/12/2012 FINDINGS: AP and lateral views of the chest show hyperexpansion. The lungs are otherwise clear without focal pneumonia, edema, pneumothorax or pleural effusion. Cardiopericardial silhouette is at upper limits of normal for size. The visualized bony structures of the thorax are intact. Telemetry leads overlie the chest. IMPRESSION: Hyperexpansion suggests underlying emphysema. No acute cardiopulmonary findings. Electronically Signed   By: Kennith Center M.D.   On: 12/08/2017 19:06    ____________________________________________   PROCEDURES .Critical Care Performed by: Sharman Cheek, MD Authorized by: Sharman Cheek, MD   Critical care provider statement:    Critical care time (minutes):  35   Critical care time was exclusive of:  Separately billable procedures and treating other  patients   Critical care was necessary to treat or prevent imminent or life-threatening deterioration of the following conditions:  Cardiac failure   Critical care was time spent personally by me on the following activities:  Development of treatment plan with patient or surrogate, discussions with consultants, evaluation of patient's response to treatment, examination of patient, obtaining history from patient or surrogate, ordering and performing treatments and interventions, ordering and review of laboratory studies, ordering and review of radiographic studies, pulse oximetry, re-evaluation of patient's condition and review of old charts    ____________________________________________  DIFFERENTIAL DIAGNOSIS   Non-STEMI, unstable angina, GERD, pneumonia, pneumothorax  CLINICAL IMPRESSION / ASSESSMENT AND PLAN / ED COURSE  Pertinent labs & imaging results that were available during my care of the patient were reviewed by me and considered in my medical decision making (see chart for details).      Clinical Course as of Dec 08 2009  Fri Dec 08, 2017  1828 Presents with precordial chest pain concerning for non-STEMI.  Pain is currently resolved with nitro spray and nitro ointment topical the patient has.  I will continue the ointment, check labs and chest x-ray.  Patient will need hospitalization for further cardiac work-up even if labs are negative.  Not consistent with unstable angina at this time.   [PS]  1940 Troponin negative, chest x-ray unremarkable without evidence of pneumonia or pneumothorax.  Plan to hospitalize for further cardiac work-up and telemetry monitoring.  IV labetalol for persistent severe hypertension.   [PS]  1953 Recurrent chest pain. I will give sublingual nitro, start heparin drip, give IV morphine.    [PS]    Clinical Course User Index [PS] Sharman Cheek, MD     ____________________________________________   FINAL CLINICAL IMPRESSION(S) / ED  DIAGNOSES    Final diagnoses:  Precordial pain  Hypertensive urgency  Unstable angina  ED Discharge Orders    None      Portions of this note were generated with dragon dictation software. Dictation errors may occur despite best attempts at proofreading.    Sharman Cheek, MD 12/08/17 2017

## 2017-12-08 NOTE — ED Notes (Signed)
Pt reports that when she woke up this morning she did not feel right. Pt states that when she got up and was walking around she felt like her "chest was going to cave in".  Pt denies having chest pressure at this time.

## 2017-12-08 NOTE — ED Triage Notes (Signed)
Pt to ED via [facility] [place]. Per EMS pt walked up c/o not feeling well and feeling like her blood pressure was elevated. Pt states that she was having pressure in her chest. EMS reports that pt was Short of breath, pale, and clammy. Per EMS pts initial blood pressure was 250/100. Pt was given 1 spray of NTG and 1 inch of NTG paste was applied to left chest. Last blood pressure for EMS was 201/78. Pt in NAD at this time.

## 2017-12-08 NOTE — H&P (Signed)
[facility] [place] at [facility]   PATIENT NAME: Jane [name]    MR#:  [id number]  DATE OF BIRTH:  03-30-1936  DATE OF ADMISSION:  12/08/2017  PRIMARY CARE PHYSICIAN: Dorothey Baseman, MD   REQUESTING/REFERRING PHYSICIAN: Sharman Cheek, MD  CHIEF COMPLAINT:   Chief Complaint  Patient presents with  . Hypertension    HISTORY OF PRESENT ILLNESS:  Jane [name]  is a 82 y.o. female with a known history of hypertension, CAD status post stent 4 years ago by Dr. Juliann Pares complains of chest pain starting around noon today described as pressure radiating to the back associated with shortness of breath and diaphoresis. Worse with exertion, better with rest but mostly constant all day.  She also notes that she has had dyspnea on exertion for the past 3 days.  She is compliant with all her medications but was recently discontinued off of atenolol (as she was on 2 b-blocker coreg and atenolol) and has noticed that her blood pressure is much higher since then.  At its worst the pain is severe, currently resolved after receiving a nitroglycerin spray and nitroglycerin patch by EMS. PAST MEDICAL HISTORY:   Past Medical History:  Diagnosis Date  . Diverticulitis   . Hiatal hernia   . Hyperlipidemia   . Hypertension   . Lymphocytic colitis   . Mitral valve prolapse   . Osteoporosis   . Reflux     PAST SURGICAL HISTORY:   Past Surgical History:  Procedure Laterality Date  . APPENDECTOMY    . CARDIAC CATHETERIZATION    . CATARACT EXTRACTION    . CERVICAL DISC SURGERY    . CHOLECYSTECTOMY    . CORONARY STENT PLACEMENT    . FOOT FRACTURE SURGERY    . hemangioma    . TONSILLECTOMY    . TUBAL LIGATION      SOCIAL HISTORY:   Social History   Tobacco Use  . Smoking status: Former Smoker    Types: Cigarettes  . Smokeless tobacco: Never Used  Substance Use Topics  . Alcohol use: No    FAMILY HISTORY:   Family History  Problem Relation Age of Onset  .  Breast cancer Paternal Aunt   . Diabetes Mother   . Hypertension Mother   . Heart attack Mother     DRUG ALLERGIES:   Allergies  Allergen Reactions  . Adhesive [Tape]   . Codeine     REVIEW OF SYSTEMS:   Review of Systems  Constitutional: Negative for chills, fever and weight loss.  HENT: Negative for nosebleeds and sore throat.   Eyes: Negative for blurred vision.  Respiratory: Negative for cough, shortness of breath and wheezing.   Cardiovascular: Positive for chest pain. Negative for orthopnea, leg swelling and PND.  Gastrointestinal: Negative for abdominal pain, constipation, diarrhea, heartburn, nausea and vomiting.  Genitourinary: Negative for dysuria and urgency.  Musculoskeletal: Negative for back pain.  Skin: Negative for rash.  Neurological: Negative for dizziness, speech change, focal weakness and headaches.  Endo/Heme/Allergies: Does not bruise/bleed easily.  Psychiatric/Behavioral: Negative for depression.   MEDICATIONS AT HOME:   Prior to Admission medications   Medication Sig Start Date End Date Taking? Authorizing Provider  acetaminophen (TYLENOL) 500 MG tablet Take 500 mg by mouth every 6 (six) hours as needed for mild pain or moderate pain.    Yes [provider]  aspirin EC 81 MG tablet Take 81 mg by mouth daily.   Yes [provider]  calcium  carbonate (TUMS - DOSED IN MG ELEMENTAL CALCIUM) 500 MG chewable tablet Chew 2 tablets by mouth daily.   Yes [provider]  carvedilol (COREG) 3.125 MG tablet Take 3.125 mg by mouth 2 (two) times daily.   Yes [provider]  cholecalciferol (VITAMIN D) 1000 units tablet Take 2,000 Units by mouth daily.    Yes [provider]  clopidogrel (PLAVIX) 75 MG tablet Take 75 mg by mouth daily.   Yes [provider]  escitalopram (LEXAPRO) 10 MG tablet Take 10 mg by mouth daily.   Yes [provider]  isosorbide dinitrate (ISORDIL) 30 MG tablet Take 30 mg by  mouth 2 (two) times daily.    Yes [provider]  Multiple Vitamins-Minerals (CENTRUM PO) Take 1 tablet by mouth daily.    Yes [provider]  nitrofurantoin, macrocrystal-monohydrate, (MACROBID) 100 MG capsule Take 100 mg by mouth 2 (two) times daily. 12/04/17 12/09/17 Yes [provider]  nitroGLYCERIN (NITROSTAT) 0.4 MG SL tablet Place 0.4 mg under the tongue every 5 (five) minutes as needed for chest pain.   Yes [provider]  quinapril (ACCUPRIL) 20 MG tablet Take 20 mg by mouth daily.    Yes [provider]  atenolol (TENORMIN) 50 MG tablet Take 50 mg by mouth daily. 09/30/16   [provider]      VITAL SIGNS:  Blood pressure (!) 168/65, pulse 61, temperature 98.8 F (37.1 C), temperature source Oral, resp. rate 17, height 5\' 3"  (1.6 m), weight 79.4 kg (175 lb), SpO2 96 %.  PHYSICAL EXAMINATION:  Physical Exam  GENERAL:  82 y.o.-year-old patient lying in the bed with no acute distress.  EYES: Pupils equal, round, reactive to light and accommodation. No scleral icterus. Extraocular muscles intact.  HEENT: Head atraumatic, normocephalic. Oropharynx and nasopharynx clear.  NECK:  Supple, no jugular venous distention. No thyroid enlargement, no tenderness.  LUNGS: Normal breath sounds bilaterally, no wheezing, rales,rhonchi or crepitation. No use of accessory muscles of respiration.  CARDIOVASCULAR: S1, S2 normal. No murmurs, rubs, or gallops.  ABDOMEN: Soft, nontender, nondistended. Bowel sounds present. No organomegaly or mass.  EXTREMITIES: No pedal edema, cyanosis, or clubbing.  NEUROLOGIC: Cranial nerves II through XII are intact. Muscle strength 5/5 in all extremities. Sensation intact. Gait not checked.  PSYCHIATRIC: The patient is alert and oriented x 3.  SKIN: No obvious rash, lesion, or ulcer.   LABORATORY PANEL:   CBC Recent Labs  Lab 12/08/17 1827  WBC 6.7  HGB 13.9  HCT 41.4  PLT 250    ------------------------------------------------------------------------------------------------------------------  Chemistries  Recent Labs  Lab 12/08/17 1827  NA 140  K 3.9  CL 106  CO2 25  GLUCOSE 111*  BUN 16  CREATININE 0.70  CALCIUM 9.6   ------------------------------------------------------------------------------------------------------------------  Cardiac Enzymes Recent Labs  Lab 12/08/17 1827  TROPONINI <0.03   ------------------------------------------------------------------------------------------------------------------  RADIOLOGY:  Dg Chest 2 View  Result Date: 12/08/2017 CLINICAL DATA:  Chest pressure EXAM: CHEST - 2 VIEW COMPARISON:  02/12/2012 FINDINGS: AP and lateral views of the chest show hyperexpansion. The lungs are otherwise clear without focal pneumonia, edema, pneumothorax or pleural effusion. Cardiopericardial silhouette is at upper limits of normal for size. The visualized bony structures of the thorax are intact. Telemetry leads overlie the chest. IMPRESSION: Hyperexpansion suggests underlying emphysema. No acute cardiopulmonary findings. Electronically Signed   By: Kennith CenterEric  Mansell M.D.   On: 12/08/2017 19:06   IMPRESSION AND PLAN:  [age] y f with chest pressure  *  Chest pressure - serial troponins - myoview in am - tele  * uncontrolled HTN - Continue home BP meds, add hydralazine for better BP control  * Recent UTI - On Nitrofurantoin for it total 5 days (2 more days)  * Depression - continue lexapro    All the records are reviewed and case discussed with ED provider. Management plans discussed with the patient, family (husband at bedside) and they are in agreement.  CODE STATUS: FULL CODE  TOTAL TIME TAKING CARE OF THIS PATIENT: 45 minutes.    Delfino Lovett M.D on 12/08/2017 at 9:50 PM  Between 7am to 6pm - Pager - [phone]  After 6pm go to [email or web address] - Social research officer, government  [facility]  Office   [phone]  CC: Primary care physician; Dorothey Baseman, MD   Note: This dictation was prepared with Dragon dictation along with smaller phrase technology. Any transcriptional errors that result from this process are unintentional.

## 2017-12-09 ENCOUNTER — Observation Stay (HOSPITAL_BASED_OUTPATIENT_CLINIC_OR_DEPARTMENT_OTHER): Payer: Medicare HMO

## 2017-12-09 DIAGNOSIS — R072 Precordial pain: Secondary | ICD-10-CM

## 2017-12-09 LAB — NM MYOCAR MULTI W/SPECT W/WALL MOTION / EF
CHL CUP NUCLEAR SDS: 1
CHL CUP NUCLEAR SRS: 4
CHL CUP RESTING HR STRESS: 68 {beats}/min
CSEPEW: 1 METS
Exercise duration (min): 1 min
Exercise duration (sec): 59 s
LV dias vol: 24 mL (ref 46–106)
LV sys vol: 7 mL
NUC STRESS TID: 1.32
Peak HR: 90 {beats}/min
SSS: 1

## 2017-12-09 LAB — TROPONIN I: Troponin I: <0.03 ng/mL (ref <0.03)

## 2017-12-09 MED ORDER — TECHNETIUM TC 99M TETROFOSMIN IV KIT
32.3700 | PACK | Freq: Once | INTRAVENOUS | Status: AC | PRN
  Administered 2017-12-09: 32.37 via INTRAVENOUS

## 2017-12-09 MED ORDER — CARVEDILOL 6.25 MG PO TABS: 6.2500 mg | ORAL_TABLET | Freq: Two times a day (BID) | ORAL | 0 refills | Status: AC

## 2017-12-09 MED ORDER — CARVEDILOL 6.25 MG PO TABS: 6.2500 mg | ORAL_TABLET | Freq: Two times a day (BID) | ORAL | Status: DC

## 2017-12-09 MED ORDER — REGADENOSON 0.4 MG/5ML IV SOLN
0.4000 mg | Freq: Once | INTRAVENOUS | Status: AC
Start: 2017-12-09 — End: 2017-12-09
  Administered 2017-12-09: 0.4 mg via INTRAVENOUS

## 2017-12-09 MED ORDER — HYDRALAZINE HCL 20 MG/ML IJ SOLN: 10.0000 mg | Freq: Four times a day (QID) | INTRAMUSCULAR | Status: DC | PRN

## 2017-12-09 MED ORDER — ISOSORBIDE DINITRATE 30 MG PO TABS
30.0000 mg | ORAL_TABLET | Freq: Two times a day (BID) | ORAL | Status: DC
  Filled 2017-12-09 (×2): qty 1

## 2017-12-09 MED ORDER — TECHNETIUM TC 99M TETROFOSMIN IV KIT
14.0000 | PACK | Freq: Once | INTRAVENOUS | Status: AC | PRN
  Administered 2017-12-09: 14 via INTRAVENOUS

## 2017-12-09 NOTE — Progress Notes (Signed)
Advanced Care Plan.  Purpose of Encounter: Code status. Parties in Attendance: The patient, her husband and me. Patient's Decisional Capacity: Yes. Medical Story: Jane [name]  is a 82 y.o. female with a known history of hypertension, CAD status post stent 4 years ago by Dr.Callwoodcomplains ofchest pain.   I discussed with the patient about her current condition, prognosis and CODE STATUS.  The patient wants to be resuscitated and intubated, full code for now.  But she does not want to put on ventilation for long time or vegetation status.  Plan:  Code Status: Full code Time spent discussing advance care planning: 18 minutes.

## 2017-12-09 NOTE — Discharge Summary (Addendum)
[facility] - [place] at [facility]   PATIENT NAME: Jane [name]    MR#:  [id number]  DATE OF BIRTH:  10-03-1935  DATE OF ADMISSION:  12/08/2017   ADMITTING PHYSICIAN: Delfino Lovett, MD  DATE OF DISCHARGE: 12/09/2017 PRIMARY CARE PHYSICIAN: Dorothey Baseman, MD   ADMISSION DIAGNOSIS:  Precordial pain [R07.2] Unstable angina (HCC) [I20.0] Hypertensive urgency [I16.0] DISCHARGE DIAGNOSIS:  Active Problems:   Unstable angina (HCC)  SECONDARY DIAGNOSIS:   Past Medical History:  Diagnosis Date  . Diverticulitis   . Hiatal hernia   . Hyperlipidemia   . Hypertension   . Lymphocytic colitis   . Mitral valve prolapse   . Osteoporosis   . Reflux    HOSPITAL COURSE:  82 y f with chest pressure  * Chest pressure Normal troponins Normal Myoview test. Continue aspirin and Plavix.  *Accelerated hypertension - Continue quinapril, imdur, increased Coreg to 6.25 twice daily p.o., added hydralazine iv prn.  * Recent UTI - On Nitrofurantoin for it total 5 days (2 more days)  * Depression - continue lexapro DISCHARGE CONDITIONS:  Stable, discharge to home today. CONSULTS OBTAINED:   DRUG ALLERGIES:   Allergies  Allergen Reactions  . Adhesive [Tape]   . Codeine    DISCHARGE MEDICATIONS:   Allergies as of 12/09/2017      Reactions   Adhesive [tape]    Codeine       Medication List    TAKE these medications   acetaminophen 500 MG tablet Commonly known as:  TYLENOL Take 500 mg by mouth every 6 (six) hours as needed for mild pain or moderate pain.   aspirin EC 81 MG tablet Take 81 mg by mouth daily.   atenolol 50 MG tablet Commonly known as:  TENORMIN Take 50 mg by mouth daily.   calcium carbonate 500 MG chewable tablet Commonly known as:  TUMS - dosed in mg elemental calcium Chew 2 tablets by mouth daily.   carvedilol 3.125 MG tablet Commonly known as:  COREG Take 3.125 mg by mouth 2 (two) times daily.   CENTRUM PO Take 1 tablet by  mouth daily.   cholecalciferol 1000 units tablet Commonly known as:  VITAMIN D Take 2,000 Units by mouth daily.   clopidogrel 75 MG tablet Commonly known as:  PLAVIX Take 75 mg by mouth daily.   escitalopram 10 MG tablet Commonly known as:  LEXAPRO Take 10 mg by mouth daily.   isosorbide dinitrate 30 MG tablet Commonly known as:  ISORDIL Take 30 mg by mouth 2 (two) times daily.   nitrofurantoin (macrocrystal-monohydrate) 100 MG capsule Commonly known as:  MACROBID Take 100 mg by mouth 2 (two) times daily.   nitroGLYCERIN 0.4 MG SL tablet Commonly known as:  NITROSTAT Place 0.4 mg under the tongue every 5 (five) minutes as needed for chest pain.   quinapril 20 MG tablet Commonly known as:  ACCUPRIL Take 20 mg by mouth daily.        DISCHARGE INSTRUCTIONS:  See AVS.  If you experience worsening of your admission symptoms, develop shortness of breath, life threatening emergency, suicidal or homicidal thoughts you must seek medical attention immediately by calling 911 or calling your MD immediately  if symptoms less severe.  You Must read complete instructions/literature along with all the possible adverse reactions/side effects for all the Medicines you take and that have been prescribed to you. Take any new Medicines after you have completely understood and accpet all the possible adverse reactions/side effects.  Please note  You were cared for by a hospitalist during your hospital stay. If you have any questions about your discharge medications or the care you received while you were in the hospital after you are discharged, you can call the unit and asked to speak with the hospitalist on call if the hospitalist that took care of you is not available. Once you are discharged, your primary care physician will handle any further medical issues. Please note that NO REFILLS for any discharge medications will be authorized once you are discharged, as it is imperative that you  return to your primary care physician (or establish a relationship with a primary care physician if you do not have one) for your aftercare needs so that they can reassess your need for medications and monitor your lab values.    On the day of Discharge:  VITAL SIGNS:  Blood pressure (!) 190/71, pulse 69, temperature (!) 97.5 F (36.4 C), temperature source Oral, resp. rate 18, height 5\' 3"  (1.6 m), weight 175 lb (79.4 kg), SpO2 99 %. PHYSICAL EXAMINATION:  GENERAL:  82 y.o.-year-old patient lying in the bed with no acute distress.  Obesity. EYES: Pupils equal, round, reactive to light and accommodation. No scleral icterus. Extraocular muscles intact.  HEENT: Head atraumatic, normocephalic. Oropharynx and nasopharynx clear.  NECK:  Supple, no jugular venous distention. No thyroid enlargement, no tenderness.  LUNGS: Normal breath sounds bilaterally, no wheezing, rales,rhonchi or crepitation. No use of accessory muscles of respiration.  CARDIOVASCULAR: S1, S2 normal. No murmurs, rubs, or gallops.  ABDOMEN: Soft, non-tender, non-distended. Bowel sounds present. No organomegaly or mass.  EXTREMITIES: No pedal edema, cyanosis, or clubbing.  NEUROLOGIC: Cranial nerves II through XII are intact. Muscle strength 5/5 in all extremities. Sensation intact. Gait not checked.  PSYCHIATRIC: The patient is alert and oriented x 3.  SKIN: No obvious rash, lesion, or ulcer.  DATA REVIEW:   CBC Recent Labs  Lab 12/08/17 1827  WBC 6.7  HGB 13.9  HCT 41.4  PLT 250    Chemistries  Recent Labs  Lab 12/08/17 1827  NA 140  K 3.9  CL 106  CO2 25  GLUCOSE 111*  BUN 16  CREATININE 0.70  CALCIUM 9.6     Microbiology Results  Results for orders placed or performed during the hospital encounter of 06/13/17  Gastrointestinal Panel by PCR , Stool     Status: None   Collection Time: 06/13/17 12:35 PM  Result Value Ref Range Status   Campylobacter species NOT DETECTED NOT DETECTED Final    Plesimonas shigelloides NOT DETECTED NOT DETECTED Final   Salmonella species NOT DETECTED NOT DETECTED Final   Yersinia enterocolitica NOT DETECTED NOT DETECTED Final   Vibrio species NOT DETECTED NOT DETECTED Final   Vibrio cholerae NOT DETECTED NOT DETECTED Final   Enteroaggregative E coli (EAEC) NOT DETECTED NOT DETECTED Final   Enteropathogenic E coli (EPEC) NOT DETECTED NOT DETECTED Final   Enterotoxigenic E coli (ETEC) NOT DETECTED NOT DETECTED Final   Shiga like toxin producing E coli (STEC) NOT DETECTED NOT DETECTED Final   Shigella/Enteroinvasive E coli (EIEC) NOT DETECTED NOT DETECTED Final   Cryptosporidium NOT DETECTED NOT DETECTED Final   Cyclospora cayetanensis NOT DETECTED NOT DETECTED Final   Entamoeba histolytica NOT DETECTED NOT DETECTED Final   Giardia lamblia NOT DETECTED NOT DETECTED Final   Adenovirus F40/41 NOT DETECTED NOT DETECTED Final   Astrovirus NOT DETECTED NOT DETECTED Final   Norovirus GI/GII NOT DETECTED NOT DETECTED  Final   Rotavirus A NOT DETECTED NOT DETECTED Final   Sapovirus (I, II, IV, and V) NOT DETECTED NOT DETECTED Final    Comment: Performed at [facility], [address] [place], [place] [address]  C difficile quick scan w PCR reflex     Status: None   Collection Time: 06/13/17 12:35 PM  Result Value Ref Range Status   C Diff antigen NEGATIVE NEGATIVE Final   C Diff toxin NEGATIVE NEGATIVE Final   C Diff interpretation No C. difficile detected.  Final    Comment: Performed at [facility], [address] [place], [place] [address]    RADIOLOGY:  Dg Chest 2 View  Result Date: 12/08/2017 CLINICAL DATA:  Chest pressure EXAM: CHEST - 2 VIEW COMPARISON:  02/12/2012 FINDINGS: AP and lateral views of the chest show hyperexpansion. The lungs are otherwise clear without focal pneumonia, edema, pneumothorax or pleural effusion. Cardiopericardial silhouette is at upper limits of normal for size. The visualized bony structures of  the thorax are intact. Telemetry leads overlie the chest. IMPRESSION: Hyperexpansion suggests underlying emphysema. No acute cardiopulmonary findings. Electronically Signed   By: Kennith CenterEric  Mansell M.D.   On: 12/08/2017 19:06   Nm Myocar Multi W/spect W/wall Motion / Ef  Result Date: 12/09/2017  There was no ST segment deviation noted during stress.  The study is normal.  This is a low risk study.  The left ventricular ejection fraction is hyperdynamic (>65%).      Management plans discussed with the patient, family and they are in agreement.  CODE STATUS: Full Code   TOTAL TIME TAKING CARE OF THIS PATIENT: 28 minutes.    Shaune PollackQing Inesha Sow M.D on 12/09/2017 at 1:13 PM  Between 7am to 6pm - Pager - [phone]  After 6pm go to [email or web address] - Social research officer, governmentpassword [email or web address] [email or web address]  [facility] [place] [facility]  Office  [phone]  CC: Primary care physician; Dorothey BasemanBronstein, David, MD   Note: This dictation was prepared with Dragon dictation along with smaller phrase technology. Any transcriptional errors that result from this process are unintentional.

## 2017-12-09 NOTE — Plan of Care (Signed)
  Problem: Clinical Measurements: Goal: Ability to maintain clinical measurements within normal limits will improve Outcome: Progressing   Problem: Clinical Measurements: Goal: Diagnostic test results will improve Outcome: Progressing   Problem: Clinical Measurements: Goal: Cardiovascular complication will be avoided Outcome: Progressing   Problem: Education: Goal: Knowledge of General Education information will improve Outcome: Progressing   Problem: Health Behavior/Discharge Planning: Goal: Ability to manage health-related needs will improve Outcome: Progressing   Problem: Pain Managment: Goal: General experience of comfort will improve Outcome: Progressing   Problem: Safety: Goal: Ability to remain free from injury will improve Outcome: Progressing

## 2017-12-09 NOTE — Progress Notes (Signed)
Pt discharged home today with husband blood pressure stable 119/67, HR 74 no complaints of CP. Pt educated on medication changes and provided with printed prescription. Pt acknowledges need to schedule follow up appointments. No complaints at this time.

## 2018-02-28 ENCOUNTER — Other Ambulatory Visit: Payer: Self-pay | Admitting: Family Medicine

## 2018-02-28 DIAGNOSIS — Z1231 Encounter for screening mammogram for malignant neoplasm of breast: Secondary | ICD-10-CM

## 2018-05-21 ENCOUNTER — Ambulatory Visit
Admission: RE | Admit: 2018-05-21 | Discharge: 2018-05-21 | Disposition: A | Discharge status: Home / Self Care | Payer: Medicare HMO | Source: Ambulatory Visit | Attending: Family Medicine | Admitting: Family Medicine

## 2018-05-21 DIAGNOSIS — Z1231 Encounter for screening mammogram for malignant neoplasm of breast: Secondary | ICD-10-CM | POA: Insufficient documentation

## 2018-05-22 ENCOUNTER — Other Ambulatory Visit: Payer: Self-pay | Admitting: Family Medicine

## 2018-05-22 DIAGNOSIS — R928 Other abnormal and inconclusive findings on diagnostic imaging of breast: Secondary | ICD-10-CM

## 2018-05-24 ENCOUNTER — Other Ambulatory Visit: Payer: Self-pay | Admitting: Family Medicine

## 2018-05-24 DIAGNOSIS — R928 Other abnormal and inconclusive findings on diagnostic imaging of breast: Secondary | ICD-10-CM

## 2018-05-24 DIAGNOSIS — N6489 Other specified disorders of breast: Secondary | ICD-10-CM

## 2018-06-05 ENCOUNTER — Ambulatory Visit
Admission: RE | Admit: 2018-06-05 | Discharge: 2018-06-05 | Disposition: A | Discharge status: Home / Self Care | Payer: Medicare HMO | Source: Ambulatory Visit | Attending: Family Medicine | Admitting: Family Medicine

## 2018-06-05 DIAGNOSIS — N6489 Other specified disorders of breast: Secondary | ICD-10-CM | POA: Diagnosis present

## 2018-06-05 DIAGNOSIS — R928 Other abnormal and inconclusive findings on diagnostic imaging of breast: Secondary | ICD-10-CM | POA: Diagnosis not present

## 2018-11-25 ENCOUNTER — Ambulatory Visit
Admission: EM | Admit: 2018-11-25 | Discharge: 2018-11-25 | Disposition: A | Discharge status: Home / Self Care | Payer: Medicare HMO | Attending: Emergency Medicine | Admitting: Emergency Medicine

## 2018-11-25 ENCOUNTER — Other Ambulatory Visit: Payer: Self-pay

## 2018-11-25 ENCOUNTER — Encounter: Payer: Self-pay | Admitting: Emergency Medicine

## 2018-11-25 DIAGNOSIS — E860 Dehydration: Secondary | ICD-10-CM | POA: Diagnosis not present

## 2018-11-25 DIAGNOSIS — R197 Diarrhea, unspecified: Secondary | ICD-10-CM

## 2018-11-25 DIAGNOSIS — K5792 Diverticulitis of intestine, part unspecified, without perforation or abscess without bleeding: Secondary | ICD-10-CM | POA: Diagnosis not present

## 2018-11-25 DIAGNOSIS — R7989 Other specified abnormal findings of blood chemistry: Secondary | ICD-10-CM

## 2018-11-25 DIAGNOSIS — E876 Hypokalemia: Secondary | ICD-10-CM

## 2018-11-25 LAB — CBC WITH DIFFERENTIAL/PLATELET
Abs Immature Granulocytes: 0.01 10*3/uL (ref 0.00–0.07)
Basophils Absolute: 0 10*3/uL (ref 0.0–0.1)
Basophils Relative: 0 %
Eosinophils Absolute: 0.1 10*3/uL (ref 0.0–0.5)
Eosinophils Relative: 1 %
HCT: 36.2 % (ref 36.0–46.0)
Hemoglobin: 12.2 g/dL (ref 12.0–15.0)
Immature Granulocytes: 0 %
Lymphocytes Relative: 23 %
Lymphs Abs: 2 10*3/uL (ref 0.7–4.0)
MCH: 32 pg (ref 26.0–34.0)
MCHC: 33.7 g/dL (ref 30.0–36.0)
MCV: 95 fL (ref 80.0–100.0)
Monocytes Absolute: 0.8 10*3/uL (ref 0.1–1.0)
Monocytes Relative: 9 %
Neutro Abs: 5.7 10*3/uL (ref 1.7–7.7)
Neutrophils Relative %: 67 %
Platelets: 235 10*3/uL (ref 150–400)
RBC: 3.81 MIL/uL — ABNORMAL LOW (ref 3.87–5.11)
RDW: 12.8 % (ref 11.5–15.5)
WBC: 8.6 10*3/uL (ref 4.0–10.5)
nRBC: 0 % (ref 0.0–0.2)

## 2018-11-25 LAB — BASIC METABOLIC PANEL
Anion gap: 12 (ref 5–15)
BUN: 27 mg/dL — ABNORMAL HIGH (ref 8–23)
CO2: 22 mmol/L (ref 22–32)
Calcium: 9.1 mg/dL (ref 8.9–10.3)
Chloride: 98 mmol/L (ref 98–111)
Creatinine, Ser: 1.28 mg/dL — ABNORMAL HIGH (ref 0.44–1.00)
GFR calc Af Amer: 45 mL/min — ABNORMAL LOW (ref >60)
GFR calc non Af Amer: 39 mL/min — ABNORMAL LOW (ref >60)
Glucose, Bld: 82 mg/dL (ref 70–99)
Potassium: 3.2 mmol/L — ABNORMAL LOW (ref 3.5–5.1)
Sodium: 132 mmol/L — ABNORMAL LOW (ref 135–145)

## 2018-11-25 LAB — OCCULT BLOOD X 1 CARD TO LAB, STOOL: Fecal Occult Bld: NEGATIVE

## 2018-11-25 MED ORDER — SODIUM CHLORIDE 0.9 % IV BOLUS
500.0000 mL | Freq: Once | INTRAVENOUS | Status: AC
  Administered 2018-11-25: 500 mL via INTRAVENOUS

## 2018-11-25 MED ORDER — POTASSIUM CHLORIDE ER 10 MEQ PO TBCR: 10.0000 meq | EXTENDED_RELEASE_TABLET | Freq: Two times a day (BID) | ORAL | 0 refills | Status: DC

## 2018-11-25 MED ORDER — AMOXICILLIN-POT CLAVULANATE 875-125 MG PO TABS: 1.0000 | ORAL_TABLET | Freq: Three times a day (TID) | ORAL | 0 refills | Status: AC

## 2018-11-25 MED ORDER — ONDANSETRON 4 MG PO TBDP: 4.0000 mg | ORAL_TABLET | Freq: Three times a day (TID) | ORAL | 0 refills | Status: DC | PRN

## 2018-11-25 MED ORDER — ONDANSETRON 4 MG PO TBDP
4.0000 mg | ORAL_TABLET | Freq: Once | ORAL | Status: AC
  Administered 2018-11-25: 4 mg via ORAL

## 2018-11-25 NOTE — ED Triage Notes (Signed)
Patient states she has diverticulitis that started on Wednesday. She is reporting diarrhea and weakness that started Wednesday.

## 2018-11-25 NOTE — Discharge Instructions (Addendum)
Finish the Augmentin unless your primary care provider tells you to stop.  The Zofran should help slow down the diarrhea.  Push electrolyte containing fluids such as Pedialyte or Gatorade.  Take the potassium supplement for 3 days.  May continue Lomotil.  We will contact you if your GI pathogen panel comes back for an infection requiring treatment

## 2018-11-25 NOTE — ED Provider Notes (Signed)
HPI  SUBJECTIVE:  Jane [name] is a 83 y.o. female who presents with 12-16 episodes of yellowish watery, nonbloody diarrhea per day for the past 5 days.  She states this is identical to previous episodes of diverticulitis.  States that she is pushing fluids, which are going "straight through me".  She reports migratory nonradiating crampy left lower quadrant pain before having a bowel movement at which resolved afterwards.  She is not having any other abdominal pain at any other time.  She states that the diarrhea is getting worse.  No fevers, nausea, vomiting, abdominal distention, change in urine output.  No recent travel, raw or undercooked foods, questionable leftovers.  No change in her medications.  No exposure to COVID.  No antibiotics recently.  No body aches, headaches, chest pain, shortness of breath, coughing, wheezing, shortness of breath.  No presyncope, syncope.  She states that she is much thirstier than usual and feels lightheaded with getting up.  She states that the car ride over here was not painful.  She has been taking Lomotil without improvement in her symptoms.  The diarrhea Is worse with eating and drinking.  No alleviating factors.  She has a past medical history of diverticulitis which she says presents as diarrhea.  She also has a history of hemangioma removal on her liver, status post appendectomy, bilateral tubal ligation.  She has a history of hypertension, lymphocytic colitis, mitral valve prolapse, CHF.  No history of diabetes.  ZOX:[id number]:Bronstein, Onalee Huaavid, MD GI: [facility]   Past Medical History:  Diagnosis Date  . Diverticulitis   . Hiatal hernia   . Hyperlipidemia   . Hypertension   . Lymphocytic colitis   . Mitral valve prolapse   . Osteoporosis   . Reflux     Past Surgical History:  Procedure Laterality Date  . APPENDECTOMY    . CARDIAC CATHETERIZATION    . CATARACT EXTRACTION    . CERVICAL DISC SURGERY    . CHOLECYSTECTOMY    . CORONARY  STENT PLACEMENT    . FOOT FRACTURE SURGERY    . hemangioma    . TONSILLECTOMY    . TUBAL LIGATION      Family History  Problem Relation Age of Onset  . Breast cancer Paternal Aunt   . Diabetes Mother   . Hypertension Mother   . Heart attack Mother     Social History   Tobacco Use  . Smoking status: Former Smoker    Types: Cigarettes  . Smokeless tobacco: Never Used  Substance Use Topics  . Alcohol use: No  . Drug use: No    No current facility-administered medications for this encounter.   Current Outpatient Medications:  .  acetaminophen (TYLENOL) 500 MG tablet, Take 500 mg by mouth every 6 (six) hours as needed for mild pain or moderate pain. , Disp: , Rfl:  .  aspirin EC 81 MG tablet, Take 81 mg by mouth daily., Disp: , Rfl:  .  atenolol (TENORMIN) 50 MG tablet, Take 50 mg by mouth daily., Disp: , Rfl:  .  calcium carbonate (TUMS - DOSED IN MG ELEMENTAL CALCIUM) 500 MG chewable tablet, Chew 2 tablets by mouth daily., Disp: , Rfl:  .  carvedilol (COREG) 6.25 MG tablet, Take 1 tablet (6.25 mg total) by mouth 2 (two) times daily., Disp: 60 tablet, Rfl: 0 .  cholecalciferol (VITAMIN D) 1000 units tablet, Take 2,000 Units by mouth daily. , Disp: , Rfl:  .  citalopram (  CELEXA) 20 MG tablet, Take 20 mg by mouth daily., Disp: , Rfl:  .  clopidogrel (PLAVIX) 75 MG tablet, Take 75 mg by mouth daily., Disp: , Rfl:  .  diphenoxylate-atropine (LOMOTIL) 2.5-0.025 MG tablet, Take by mouth 4 (four) times daily as needed for diarrhea or loose stools., Disp: , Rfl:  .  isosorbide dinitrate (ISORDIL) 30 MG tablet, Take 30 mg by mouth 2 (two) times daily. , Disp: , Rfl:  .  Multiple Vitamins-Minerals (CENTRUM PO), Take 1 tablet by mouth daily. , Disp: , Rfl:  .  nitroGLYCERIN (NITROSTAT) 0.4 MG SL tablet, Place 0.4 mg under the tongue every 5 (five) minutes as needed for chest pain., Disp: , Rfl:  .  quinapril (ACCUPRIL) 20 MG tablet, Take 20 mg by mouth daily. , Disp: , Rfl:  .   amoxicillin-clavulanate (AUGMENTIN) 875-125 MG tablet, Take 1 tablet by mouth 3 (three) times daily for 7 days., Disp: 21 tablet, Rfl: 0 .  ondansetron (ZOFRAN ODT) 4 MG disintegrating tablet, Take 1 tablet (4 mg total) by mouth every 8 (eight) hours as needed for nausea or vomiting., Disp: 20 tablet, Rfl: 0 .  potassium chloride (K-DUR) 10 MEQ tablet, Take 1 tablet (10 mEq total) by mouth 2 (two) times daily for 3 days., Disp: 6 tablet, Rfl: 0  Allergies  Allergen Reactions  . Adhesive [Tape]   . Codeine      ROS  As noted in HPI.   Physical Exam  BP (!) 141/120 (BP Location: Right Arm)   Pulse (!) 109   Temp 98.5 F (36.9 C) (Oral)   Resp 18   Ht 5\' 3"  (1.6 m)   Wt 77.1 kg   SpO2 96%   BMI 30.11 kg/m   Constitutional: Well developed, well nourished, no acute distress.  Moving around the table comfortably Eyes:  EOMI, conjunctiva normal bilaterally HENT: Normocephalic, atraumatic,mucus membranes moist Respiratory: Normal inspiratory effort, lungs clear bilaterally Cardiovascular: Regular tachycardia, no murmurs rubs or gallops GI: Positive right upper quadrant surgical scar.  Soft, positive left lower quadrant tenderness, nondistended, active bowel sounds, no guarding, rebound.  Tap table test negative. Rectal: Normal rectal tone.  Positive greenish stool in vault.  No gross blood.  Hemoccult card sent skin: No rash, skin intact Musculoskeletal: no deformities Neurologic: Alert & oriented x 3, no focal neuro deficits Psychiatric: Speech and behavior appropriate   ED Course   Medications  sodium chloride 0.9 % bolus 500 mL (0 mLs Intravenous Stopped 11/25/18 1432)  ondansetron (ZOFRAN-ODT) disintegrating tablet 4 mg (4 mg Oral Given 11/25/18 1334)    Orders Placed This Encounter  Procedures  . Gastrointestinal Panel by PCR , Stool    Standing Status:   Standing    Number of Occurrences:   1  . C Difficile Quick Screen w PCR reflex    Standing Status:   Standing     Number of Occurrences:   1  . CBC with Differential    Standing Status:   Standing    Number of Occurrences:   1  . Basic metabolic panel    Standing Status:   Standing    Number of Occurrences:   1  . Occult blood card to lab, stool Provider will collect    Standing Status:   Standing    Number of Occurrences:   1    Order Specific Question:   Specimen to be collected by?    Answer:   Provider will collect  . Enteric  precautions (UV disinfection) C difficile, Norovirus    Standing Status:   Standing    Number of Occurrences:   1  . Insert peripheral IV    Standing Status:   Standing    Number of Occurrences:   1    Results for orders placed or performed during the hospital encounter of 11/25/18 (from the past 24 hour(s))  CBC with Differential     Status: Abnormal   Collection Time: 11/25/18  1:08 PM  Result Value Ref Range   WBC 8.6 4.0 - 10.5 K/uL   RBC 3.81 (L) 3.87 - 5.11 MIL/uL   Hemoglobin 12.2 12.0 - 15.0 g/dL   HCT 40.936.2 81.136.0 - 91.446.0 %   MCV 95.0 80.0 - 100.0 fL   MCH 32.0 26.0 - 34.0 pg   MCHC 33.7 30.0 - 36.0 g/dL   RDW 78.212.8 95.611.5 - 21.315.5 %   Platelets 235 150 - 400 K/uL   nRBC 0.0 0.0 - 0.2 %   Neutrophils Relative % 67 %   Neutro Abs 5.7 1.7 - 7.7 K/uL   Lymphocytes Relative 23 %   Lymphs Abs 2.0 0.7 - 4.0 K/uL   Monocytes Relative 9 %   Monocytes Absolute 0.8 0.1 - 1.0 K/uL   Eosinophils Relative 1 %   Eosinophils Absolute 0.1 0.0 - 0.5 K/uL   Basophils Relative 0 %   Basophils Absolute 0.0 0.0 - 0.1 K/uL   Immature Granulocytes 0 %   Abs Immature Granulocytes 0.01 0.00 - 0.07 K/uL  Basic metabolic panel     Status: Abnormal   Collection Time: 11/25/18  1:08 PM  Result Value Ref Range   Sodium 132 (L) 135 - 145 mmol/L   Potassium 3.2 (L) 3.5 - 5.1 mmol/L   Chloride 98 98 - 111 mmol/L   CO2 22 22 - 32 mmol/L   Glucose, Bld 82 70 - 99 mg/dL   BUN 27 (H) 8 - 23 mg/dL   Creatinine, Ser 0.861.28 (H) 0.44 - 1.00 mg/dL   Calcium 9.1 8.9 - 57.810.3 mg/dL   GFR calc  non Af Amer 39 (L) >60 mL/min   GFR calc Af Amer 45 (L) >60 mL/min   Anion gap 12 5 - 15  Occult blood card to lab, stool Provider will collect     Status: None   Collection Time: 11/25/18  1:08 PM  Result Value Ref Range   Fecal Occult Bld NEGATIVE NEGATIVE   No results found.  ED Clinical Impression  1. Diarrhea, unspecified type   2. Diverticulitis   3. Dehydration   4. Elevated serum creatinine   5. Hypokalemia      ED Assessment/Plan  ER records reviewed. Patient was seen in the ED in January 2019 for diverticulitis/diarrhea.  Stool testing was negative, she had no diverticulitis.  Sent home with antibiotics.  Patient has no peritoneal signs, she is moving around comfortably.  Evidence of surgical abdomen at this time.  Discussed sending her to the ED for advanced imaging and work-up, but patient has declined to do so. Gave her Very strict ER return precautions.  Will give her 500 cc of fluids as she reports a history of congestive heart failure, Zofran for the diarrhea, do a BMP, CBC, Hemoccult, GI pathogen panel and a C. difficile.  Will reevaluate.  Patient is dehydrated, has an elevated creatinine compared to baseline, but I think that this will resolve with fluid replacements some mild hypokalemia from the diarrhea, will send home with  3 days of KCl supplement twice a day.  Hemoccult negative, otherwise labs unremarkable.  Calculated creatinine clearance 41.95.  Presentation consistent with a diverticulitis.   Repeat abdominal exam no guarding, rebound.  Patient states she feels better.  Still with continued left lower quadrant tenderness.  Will send home with Augmentin 875 mg p.o. three times daily for 7 days for presumed diverticulitis, Zofran to help stop the diarrhea, may continue the Lomotil, Tylenol as needed for pain.  Will send home with 3 days of potassium supplement to correct the hypokalemia.  Push Electrolyte containing fluids, follow-up with PMD within a week to  have creatinine rechecked, and again discussed strict ER return precautions.  Discussed labs, MDM, treatment plan, and plan for follow-up with patient. Discussed sn/sx that should prompt return to the ED. patient agrees with plan.   Meds ordered this encounter  Medications  . sodium chloride 0.9 % bolus 500 mL  . ondansetron (ZOFRAN-ODT) disintegrating tablet 4 mg  . amoxicillin-clavulanate (AUGMENTIN) 875-125 MG tablet    Sig: Take 1 tablet by mouth 3 (three) times daily for 7 days.    Dispense:  21 tablet    Refill:  0  . potassium chloride (K-DUR) 10 MEQ tablet    Sig: Take 1 tablet (10 mEq total) by mouth 2 (two) times daily for 3 days.    Dispense:  6 tablet    Refill:  0  . ondansetron (ZOFRAN ODT) 4 MG disintegrating tablet    Sig: Take 1 tablet (4 mg total) by mouth every 8 (eight) hours as needed for nausea or vomiting.    Dispense:  20 tablet    Refill:  0    *This clinic note was created using [employer]. Therefore, there may be occasional mistakes despite careful proofreading.   ?    Melynda Ripple, MD 11/25/18 1515

## 2018-11-26 DIAGNOSIS — R7989 Other specified abnormal findings of blood chemistry: Secondary | ICD-10-CM | POA: Diagnosis not present

## 2018-11-26 DIAGNOSIS — K5792 Diverticulitis of intestine, part unspecified, without perforation or abscess without bleeding: Secondary | ICD-10-CM | POA: Diagnosis not present

## 2018-11-26 DIAGNOSIS — E860 Dehydration: Secondary | ICD-10-CM | POA: Diagnosis not present

## 2018-11-26 DIAGNOSIS — R197 Diarrhea, unspecified: Secondary | ICD-10-CM | POA: Diagnosis not present

## 2018-11-26 LAB — GASTROINTESTINAL PANEL BY PCR, STOOL (REPLACES STOOL CULTURE)

## 2018-11-26 LAB — C DIFFICILE QUICK SCREEN W PCR REFLEX
C Diff antigen: NEGATIVE
C Diff interpretation: NOT DETECTED
C Diff toxin: NEGATIVE

## 2019-03-20 ENCOUNTER — Other Ambulatory Visit: Payer: Self-pay | Admitting: Family Medicine

## 2019-03-20 DIAGNOSIS — Z1231 Encounter for screening mammogram for malignant neoplasm of breast: Secondary | ICD-10-CM

## 2019-05-23 ENCOUNTER — Ambulatory Visit
Admission: RE | Admit: 2019-05-23 | Discharge: 2019-05-23 | Disposition: A | Discharge status: Home / Self Care | Payer: Medicare HMO | Source: Ambulatory Visit | Attending: Family Medicine | Admitting: Family Medicine

## 2019-05-23 DIAGNOSIS — Z1231 Encounter for screening mammogram for malignant neoplasm of breast: Secondary | ICD-10-CM | POA: Diagnosis not present

## 2019-06-19 ENCOUNTER — Ambulatory Visit: Payer: Medicare HMO

## 2019-08-06 IMAGING — MG MM DIGITAL SCREENING BILAT W/ CAD
4 series · 4 of 4 positions shown · non-contrast
Comparison: Previous exam(s).

CLINICAL DATA: Screening.

EXAM:
DIGITAL SCREENING BILATERAL MAMMOGRAM WITH CAD

[R MLO]
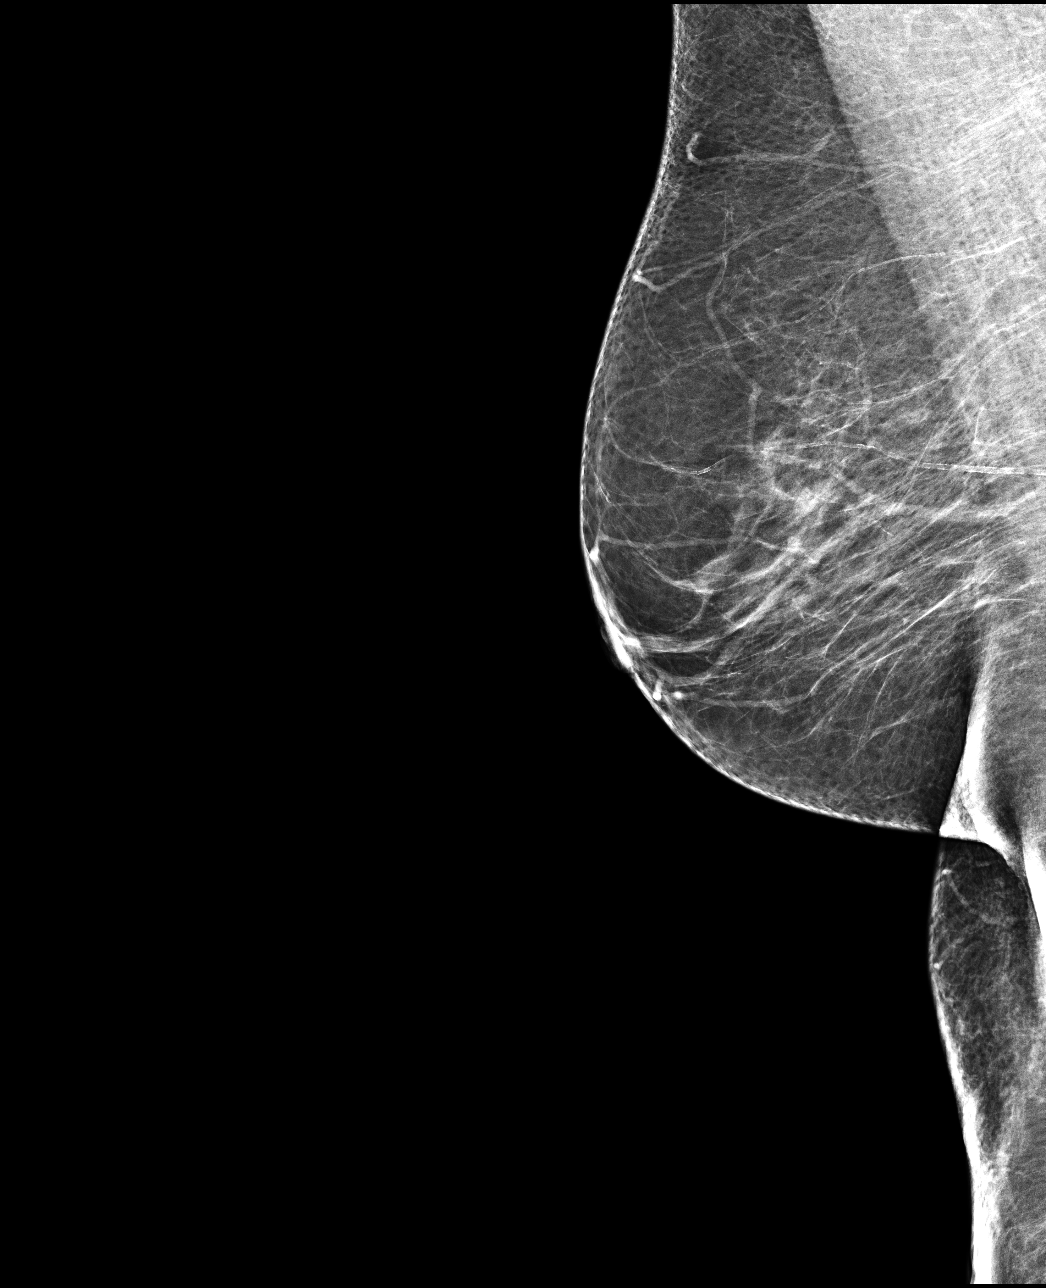

[L CC]
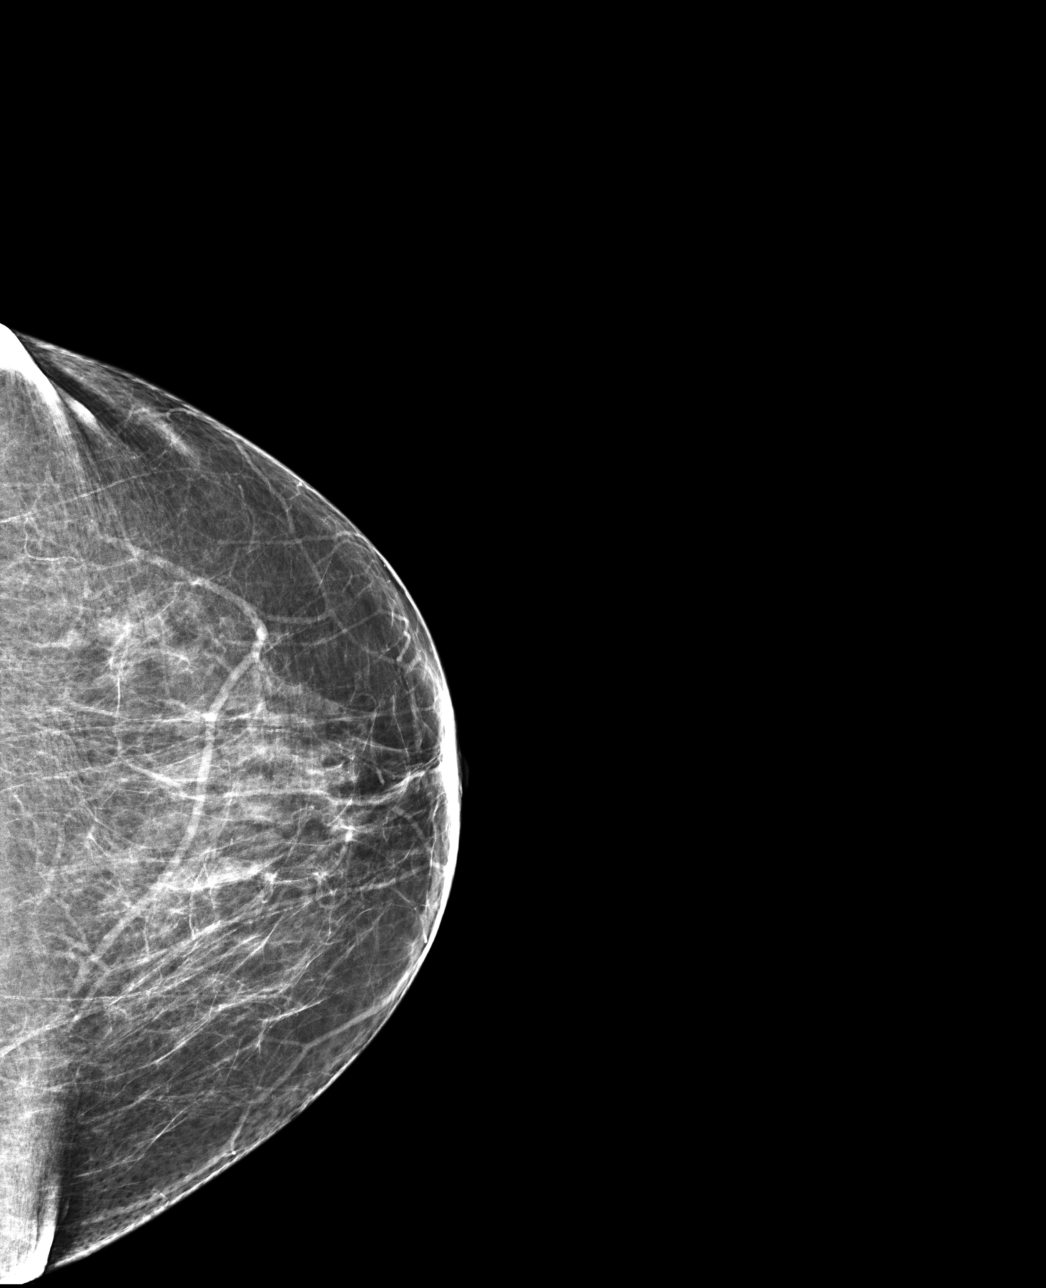

[R CC]
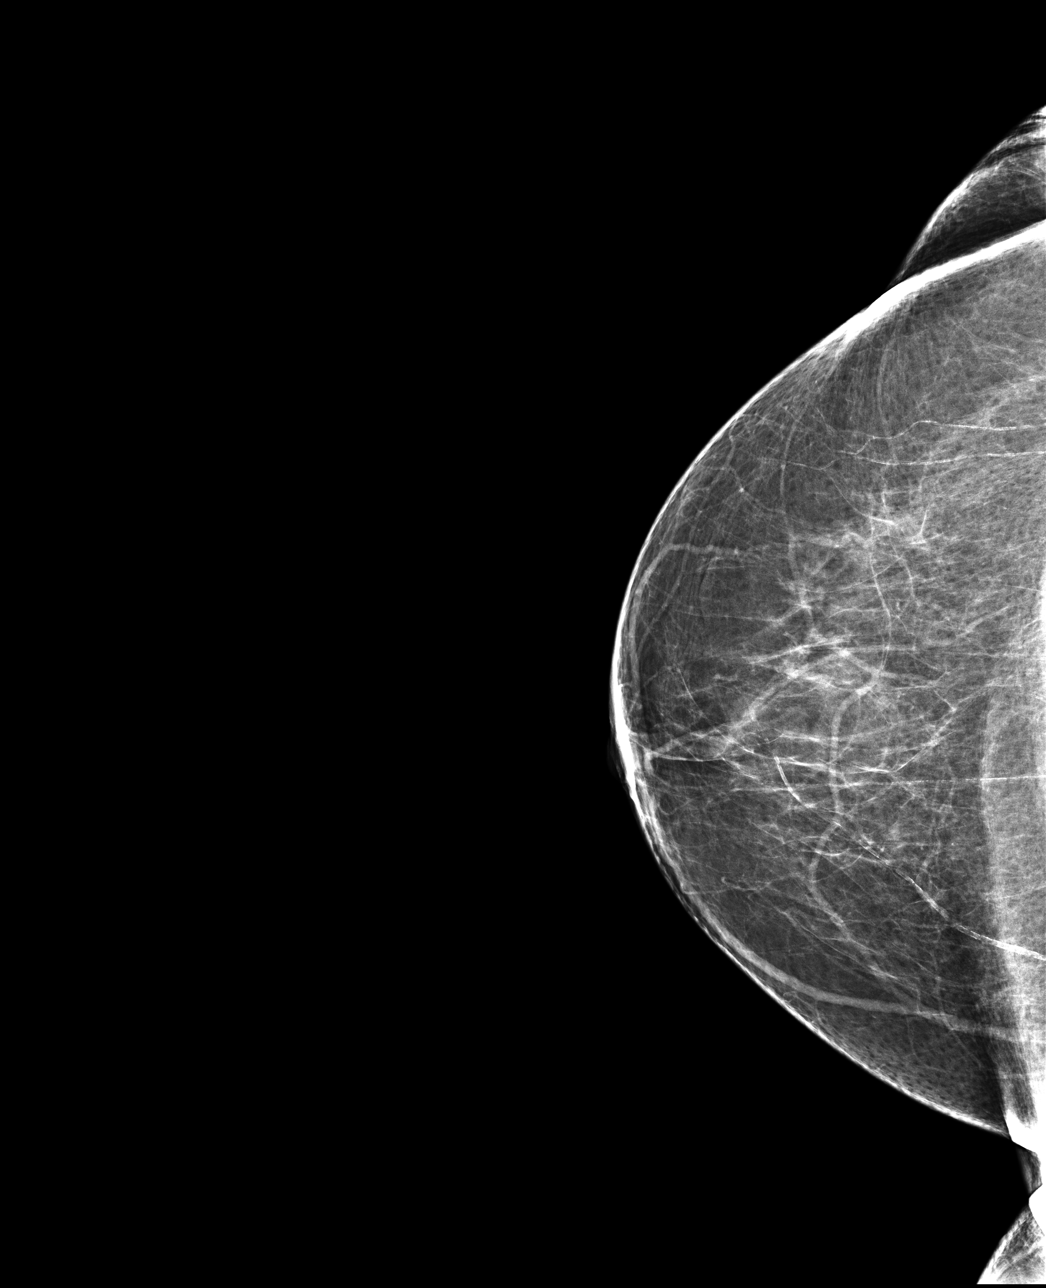

[L MLO]
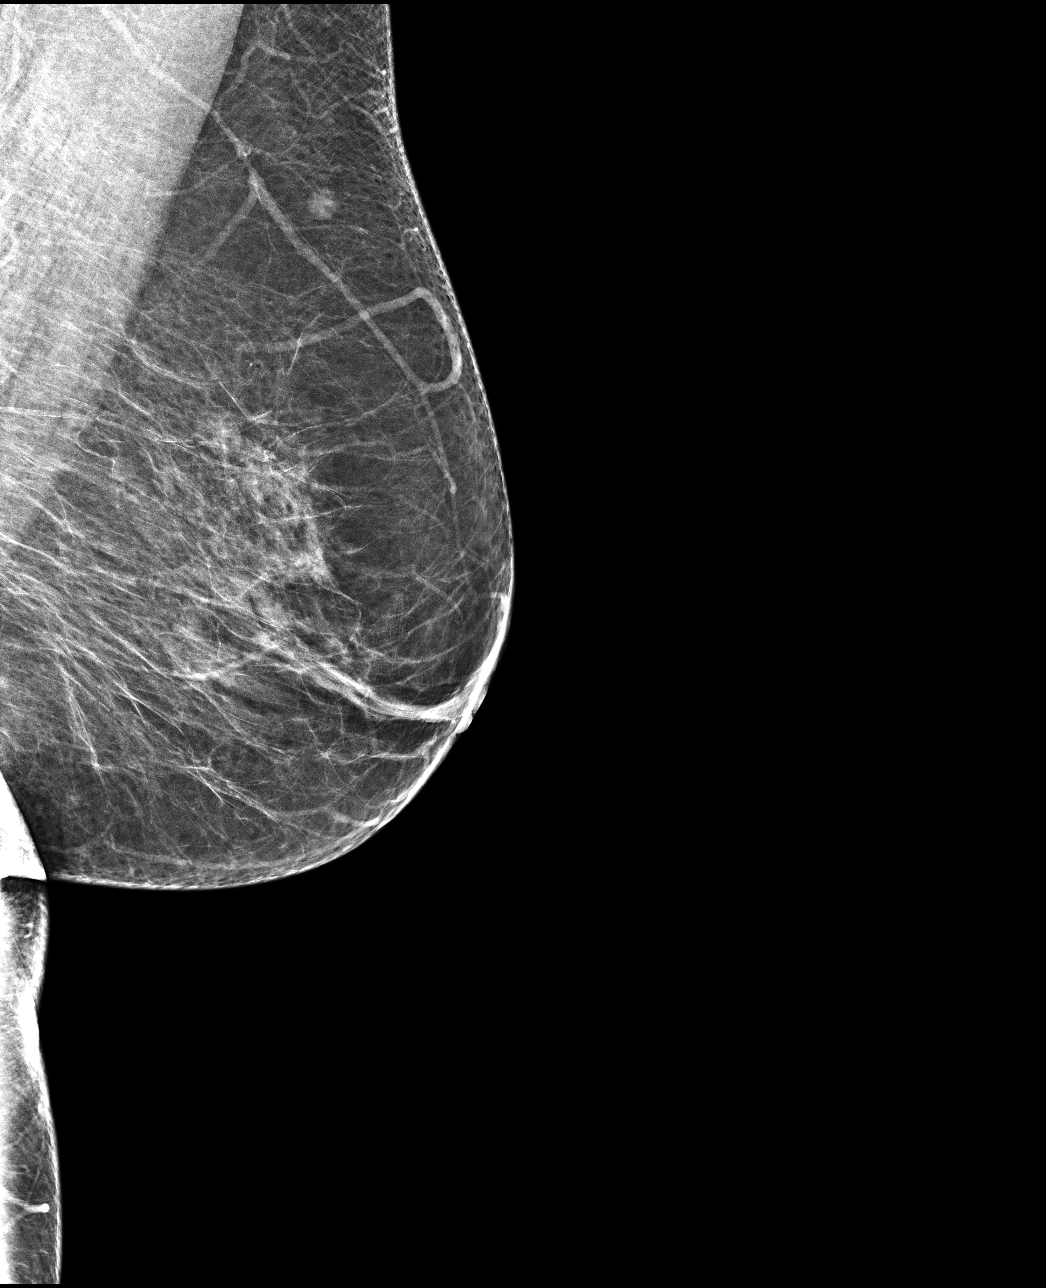

[4 of 4 positions shown; findings below may reference images not displayed]

ACR Breast Density Category b: There are scattered areas of
fibroglandular density.
FINDINGS: There are no findings suspicious for malignancy. Images were
processed with CAD.
IMPRESSION: No mammographic evidence of malignancy. A result letter of this
screening mammogram will be mailed directly to the patient.

RECOMMENDATION:
Screening mammogram in one year. (Code:AS-G-LCT)

BI-RADS CATEGORY  1: Negative.

## 2020-03-24 ENCOUNTER — Other Ambulatory Visit: Payer: Self-pay | Admitting: Family Medicine

## 2020-03-24 DIAGNOSIS — Z1231 Encounter for screening mammogram for malignant neoplasm of breast: Secondary | ICD-10-CM

## 2020-05-25 ENCOUNTER — Other Ambulatory Visit: Payer: Self-pay

## 2020-05-25 ENCOUNTER — Ambulatory Visit
Admission: RE | Admit: 2020-05-25 | Discharge: 2020-05-25 | Disposition: A | Discharge status: Home / Self Care | Payer: Medicare HMO | Source: Ambulatory Visit | Attending: Family Medicine | Admitting: Family Medicine

## 2020-05-25 DIAGNOSIS — Z1231 Encounter for screening mammogram for malignant neoplasm of breast: Secondary | ICD-10-CM | POA: Diagnosis present

## 2020-07-25 ENCOUNTER — Encounter: Payer: Self-pay | Admitting: Emergency Medicine

## 2020-07-25 ENCOUNTER — Other Ambulatory Visit: Payer: Self-pay

## 2020-07-25 ENCOUNTER — Ambulatory Visit
Admission: EM | Admit: 2020-07-25 | Discharge: 2020-07-25 | Disposition: A | Discharge status: Home / Self Care | Payer: Medicare HMO | Attending: Family Medicine | Admitting: Family Medicine

## 2020-07-25 DIAGNOSIS — N3001 Acute cystitis with hematuria: Secondary | ICD-10-CM | POA: Diagnosis not present

## 2020-07-25 LAB — URINALYSIS, COMPLETE (UACMP) WITH MICROSCOPIC: WBC, UA: >50 WBC/hpf (ref 0–5)

## 2020-07-25 MED ORDER — NITROFURANTOIN MONOHYD MACRO 100 MG PO CAPS: 100.0000 mg | ORAL_CAPSULE | Freq: Two times a day (BID) | ORAL | 0 refills | Status: DC

## 2020-07-25 NOTE — Discharge Instructions (Addendum)
Medication as prescribed.  Follow up with your PCP.  Take care  Dr. Tanique Matney  

## 2020-07-25 NOTE — ED Provider Notes (Signed)
[facility]    CSN: [id number] Arrival date & time: 07/25/20  1320      History   Chief Complaint Chief Complaint  Patient presents with  . Dysuria   HPI  85 year old female presents with UTI symptoms.   Started yesterday. She reports dysuria, urgency, frequency.  Has history of recurrent UTI.  No fever.  She does note some associated suprapubic abdominal pain.  Pain 8/10 in severity.  She has taken Azo without relief.  No other complaints or concerns at this time.  Past Medical History:  Diagnosis Date  . Diverticulitis   . Hiatal hernia   . Hyperlipidemia   . Hypertension   . Lymphocytic colitis   . Mitral valve prolapse   . Osteoporosis   . Reflux     Patient Active Problem List   Diagnosis Date Noted  . Unstable angina (HCC) 12/08/2017  . Pelvic fracture (HCC) 11/25/2016    Past Surgical History:  Procedure Laterality Date  . APPENDECTOMY    . CARDIAC CATHETERIZATION    . CATARACT EXTRACTION    . CERVICAL DISC SURGERY    . CHOLECYSTECTOMY    . CORONARY STENT PLACEMENT    . FOOT FRACTURE SURGERY    . hemangioma    . TONSILLECTOMY    . TUBAL LIGATION      OB History   No obstetric history on file.      Home Medications    Prior to Admission medications   Medication Sig Start Date End Date Taking? Authorizing Provider  aspirin EC 81 MG tablet Take 81 mg by mouth daily.   Yes [provider]  atenolol (TENORMIN) 50 MG tablet Take 50 mg by mouth daily. 09/30/16  Yes [provider]  calcium carbonate (TUMS - DOSED IN MG ELEMENTAL CALCIUM) 500 MG chewable tablet Chew 2 tablets by mouth daily.   Yes [provider]  carvedilol (COREG) 6.25 MG tablet Take 1 tablet (6.25 mg total) by mouth 2 (two) times daily. 12/09/17  Yes Shaune Pollack, MD  cholecalciferol (VITAMIN D) 1000 units tablet Take 2,000 Units by mouth daily.    Yes [provider]  citalopram (CELEXA) 20 MG tablet Take 20 mg by mouth daily.   Yes  [provider]  clopidogrel (PLAVIX) 75 MG tablet Take 75 mg by mouth daily.   Yes [provider]  Multiple Vitamins-Minerals (CENTRUM PO) Take 1 tablet by mouth daily.    Yes [provider]  nitrofurantoin, macrocrystal-monohydrate, (MACROBID) 100 MG capsule Take 1 capsule (100 mg total) by mouth 2 (two) times daily. 07/25/20  Yes Angles Trevizo G, DO  quinapril (ACCUPRIL) 20 MG tablet Take 20 mg by mouth daily.    Yes [provider]  acetaminophen (TYLENOL) 500 MG tablet Take 500 mg by mouth every 6 (six) hours as needed for mild pain or moderate pain.     [provider]  diphenoxylate-atropine (LOMOTIL) 2.5-0.025 MG tablet Take by mouth 4 (four) times daily as needed for diarrhea or loose stools.    [provider]  isosorbide dinitrate (ISORDIL) 30 MG tablet Take 30 mg by mouth 2 (two) times daily.     [provider]  nitroGLYCERIN (NITROSTAT) 0.4 MG SL tablet Place 0.4 mg under the tongue every 5 (five) minutes as needed for chest pain.    [provider]  ondansetron (ZOFRAN ODT) 4 MG disintegrating tablet Take 1 tablet (4 mg total) by mouth every 8 (eight) hours  as needed for nausea or vomiting. 11/25/18   Domenick Gong, MD  potassium chloride (K-DUR) 10 MEQ tablet Take 1 tablet (10 mEq total) by mouth 2 (two) times daily for 3 days. 11/25/18 11/28/18  Domenick Gong, MD  escitalopram (LEXAPRO) 10 MG tablet Take 10 mg by mouth daily.  11/25/18  [provider]    Family History Family History  Problem Relation Age of Onset  . Breast cancer Paternal Aunt   . Diabetes Mother   . Hypertension Mother   . Heart attack Mother     Social History Social History   Tobacco Use  . Smoking status: Former Smoker    Types: Cigarettes  . Smokeless tobacco: Never Used  Vaping Use  . Vaping Use: Never used  Substance Use Topics  . Alcohol use: No  . Drug use: No     Allergies   Adhesive [tape] and  Codeine   Review of Systems Review of Systems  Constitutional: Negative for fever.  Gastrointestinal: Positive for abdominal pain.  Genitourinary: Positive for dysuria, frequency and urgency.   Physical Exam Triage Vital Signs ED Triage Vitals  Enc Vitals Group     BP 07/25/20 1329 (!) 109/55     Pulse Rate 07/25/20 1329 65     Resp 07/25/20 1329 14     Temp 07/25/20 1329 98.6 F (37 C)     Temp Source 07/25/20 1329 Oral     SpO2 07/25/20 1329 96 %     Weight 07/25/20 1327 168 lb (76.2 kg)     Height 07/25/20 1327 5\' 2"  (1.575 m)     Head Circumference --      Peak Flow --      Pain Score 07/25/20 1326 8     Pain Loc --      Pain Edu? --      Excl. in GC? --    Updated Vital Signs BP (!) 109/55 (BP Location: Left Arm)   Pulse 65   Temp 98.6 F (37 C) (Oral)   Resp 14   Ht 5\' 2"  (1.575 m)   Wt 76.2 kg   SpO2 96%   BMI 30.73 kg/m   Visual Acuity Right Eye Distance:   Left Eye Distance:   Bilateral Distance:    Right Eye Near:   Left Eye Near:    Bilateral Near:     Physical Exam Vitals and nursing note reviewed.  Constitutional:      General: She is not in acute distress.    Appearance: Normal appearance. She is not ill-appearing.  HENT:     Head: Normocephalic and atraumatic.  Eyes:     General:        Right eye: No discharge.        Left eye: No discharge.     Conjunctiva/sclera: Conjunctivae normal.  Cardiovascular:     Rate and Rhythm: Normal rate and regular rhythm.  Pulmonary:     Effort: Pulmonary effort is normal.     Breath sounds: Normal breath sounds. No wheezing, rhonchi or rales.  Abdominal:     General: There is no distension.     Palpations: Abdomen is soft.     Tenderness: There is no abdominal tenderness.  Neurological:     Mental Status: She is alert.  Psychiatric:        Mood and Affect: Mood normal.        Behavior: Behavior normal.    UC Treatments / Results  Labs (all  labs ordered are listed, but only abnormal results  are displayed) Labs Reviewed  URINALYSIS, COMPLETE (UACMP) WITH MICROSCOPIC - Abnormal; Notable for the following components:      Result Value   Color, Urine ORANGE (*)    APPearance HAZY (*)    Glucose, UA   (*)    Value: TEST NOT REPORTED DUE TO COLOR INTERFERENCE OF URINE PIGMENT   Hgb urine dipstick   (*)    Value: TEST NOT REPORTED DUE TO COLOR INTERFERENCE OF URINE PIGMENT   Bilirubin Urine   (*)    Value: TEST NOT REPORTED DUE TO COLOR INTERFERENCE OF URINE PIGMENT   Ketones, ur   (*)    Value: TEST NOT REPORTED DUE TO COLOR INTERFERENCE OF URINE PIGMENT   Protein, ur   (*)    Value: TEST NOT REPORTED DUE TO COLOR INTERFERENCE OF URINE PIGMENT   Nitrite   (*)    Value: TEST NOT REPORTED DUE TO COLOR INTERFERENCE OF URINE PIGMENT   Leukocytes,Ua   (*)    Value: TEST NOT REPORTED DUE TO COLOR INTERFERENCE OF URINE PIGMENT   Bacteria, UA MANY (*)    All other components within normal limits  URINE CULTURE    EKG   Radiology No results found.  Procedures Procedures (including critical care time)  Medications Ordered in UC Medications - No data to display  Initial Impression / Assessment and Plan / UC Course  I have reviewed the triage vital signs and the nursing notes.  Pertinent labs & imaging results that were available during my care of the patient were reviewed by me and considered in my medical decision making (see chart for details).    85 year old female presents with UTI. Treating with Macrobid.  Patient states that this medication works very well for her and she prefers Macrobid over other therapeutic options.  Final Clinical Impressions(s) / UC Diagnoses   Final diagnoses:  Acute cystitis with hematuria     Discharge Instructions     Medication as prescribed.  Follow up with your PCP  Take care  Dr. Adriana Simas    ED Prescriptions    Medication Sig Dispense Auth. Provider   nitrofurantoin, macrocrystal-monohydrate, (MACROBID) 100 MG capsule Take  1 capsule (100 mg total) by mouth 2 (two) times daily. 14 capsule Everlene Other G, DO     PDMP not reviewed this encounter.   Tommie Sams, [place] 07/25/20 1441

## 2020-07-25 NOTE — ED Triage Notes (Signed)
Patient c/o burning when urinating and urinary urgency that started yesterday.  Patient states that she has been taking AZO.

## 2020-07-28 LAB — URINE CULTURE: Culture: >=100000 — AB

## 2021-01-31 ENCOUNTER — Ambulatory Visit
Admission: EM | Admit: 2021-01-31 | Discharge: 2021-01-31 | Disposition: A | Discharge status: Home / Self Care | Payer: Medicare HMO

## 2021-01-31 ENCOUNTER — Ambulatory Visit (INDEPENDENT_AMBULATORY_CARE_PROVIDER_SITE_OTHER): Payer: Medicare HMO

## 2021-01-31 ENCOUNTER — Other Ambulatory Visit: Payer: Self-pay

## 2021-01-31 DIAGNOSIS — M62838 Other muscle spasm: Secondary | ICD-10-CM | POA: Diagnosis not present

## 2021-01-31 DIAGNOSIS — M542 Cervicalgia: Secondary | ICD-10-CM | POA: Diagnosis not present

## 2021-01-31 MED ORDER — TRAMADOL HCL 50 MG PO TABS: 50.0000 mg | ORAL_TABLET | Freq: Four times a day (QID) | ORAL | 0 refills | Status: DC | PRN

## 2021-01-31 MED ORDER — BACLOFEN 10 MG PO TABS: 10.0000 mg | ORAL_TABLET | Freq: Three times a day (TID) | ORAL | 0 refills | Status: DC

## 2021-01-31 NOTE — ED Triage Notes (Addendum)
Pt c/o neck stiffness since Friday morning. Pt states she woke up with left-sided neck pain that is radiating into her left shoulder. Pt denies any known injury or other cause. Pt does have hx of cervical fusion and other injuries to her neck. Pt also reports elevated BP this morning, 153/96, 168/87 this morning. Pt does take HTN meds.

## 2021-01-31 NOTE — Discharge Instructions (Addendum)
Continue use Tylenol for mild to moderate pain and use the tramadol as needed for severe pain.  Be mindful that this may make you dizzy so do not drive a vehicle or drink alcohol if you take it.  Also be cautious when changing positions from laying or sitting to standing.  Transition slowly and in stages.  Take the baclofen 3 times a day to help with muscle spasm.  I would avoid using your TENS unit for the next 24 to 48 hours until the muscle relaxers have had a chance to work.  After that you can try to low settings to see if it helps.  Follow-up with your spinal specialist on Monday.

## 2021-01-31 NOTE — ED Provider Notes (Signed)
[facility]    CSN: [id number] Arrival date & time: 01/31/21  0801      History   Chief Complaint Chief Complaint  Patient presents with   Torticollis   Hypertension    HPI Jane [name] is a 85 y.o. female.   HPI  85 year old female here for evaluation of neck stiffness and pain.  Patient reports that she awoke with left-sided neck pain and stiffness that radiated to her left shoulder 2 days ago.  This is causing decreased range of motion when she tries to turn her neck to the left but is not affecting her shoulder.  She denies numbness, tingling, or weakness in her hand.  She denies any injuries or falls that she is aware of.  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  She is currently taking Prolia to help treat the osteoporosis that caused the pathological fractures.  She has been using Tylenol at home with no relief of her pain and it is impacting her sleep.  She currently rates her pain as an 8-9/10.  Patient is also concerned because her blood pressure was elevated this morning at 153/96 and then 168/87.  This was prior to taking her blood pressure medication.  I explained to the patient that pain does increase her blood pressure.  She has taken her medication and her blood pressure is currently 123/55.  Past Medical History:  Diagnosis Date   Diverticulitis    Hiatal hernia    Hyperlipidemia    Hypertension    Lymphocytic colitis    Mitral valve prolapse    Osteoporosis    Reflux     Patient Active Problem List   Diagnosis Date Noted   Unstable angina (HCC) 12/08/2017   Pelvic fracture (HCC) 11/25/2016    Past Surgical History:  Procedure Laterality Date   APPENDECTOMY     CARDIAC CATHETERIZATION     CATARACT EXTRACTION     CERVICAL DISC SURGERY     CHOLECYSTECTOMY     CORONARY STENT PLACEMENT     FOOT FRACTURE SURGERY      hemangioma     TONSILLECTOMY     TUBAL LIGATION      OB History   No obstetric history on file.      Home Medications    Prior to Admission medications   Medication Sig Start Date End Date Taking? Authorizing Provider  acetaminophen (TYLENOL) 500 MG tablet Take 500 mg by mouth every 6 (six) hours as needed for mild pain or moderate pain.    Yes [provider]  aspirin EC 81 MG tablet Take 81 mg by mouth daily.   Yes [provider]  atenolol (TENORMIN) 50 MG tablet Take 50 mg by mouth daily. 09/30/16  Yes [provider]  baclofen (LIORESAL) 10 MG tablet Take 1 tablet (10 mg total) by mouth 3 (three) times daily. 01/31/21  Yes Becky Augusta, NP  calcium carbonate (TUMS - DOSED IN MG ELEMENTAL CALCIUM) 500 MG chewable tablet Chew 2 tablets by mouth daily.   Yes [provider]  carvedilol (COREG) 6.25 MG tablet Take 1 tablet (6.25 mg total) by mouth 2 (two) times daily. 12/09/17  Yes Shaune Pollack, MD  chlorthalidone (HYGROTON) 25 MG tablet Take 12.5 mg by mouth daily. 01/18/21  Yes [provider]  cholecalciferol (VITAMIN D) 1000 units tablet Take 2,000  Units by mouth daily.    Yes [provider]  citalopram (CELEXA) 20 MG tablet Take 20 mg by mouth daily.   Yes [provider]  clopidogrel (PLAVIX) 75 MG tablet Take 1 tablet by mouth daily. 10/12/15  Yes [provider]  diphenoxylate-atropine (LOMOTIL) 2.5-0.025 MG tablet Take by mouth 4 (four) times daily as needed for diarrhea or loose stools.   Yes [provider]  isosorbide dinitrate (ISORDIL) 30 MG tablet Take 30 mg by mouth 2 (two) times daily.    Yes [provider]  isosorbide mononitrate (IMDUR) 30 MG 24 hr tablet Take 30 mg by mouth 2 (two) times daily. 01/20/21  Yes [provider]  labetalol (NORMODYNE) 100 MG tablet Take by mouth. 10/12/15  Yes [provider]  Multiple Vitamins-Minerals (CENTRUM PO) Take 1 tablet by mouth  daily.    Yes [provider]  nitroGLYCERIN (NITROSTAT) 0.4 MG SL tablet Place 0.4 mg under the tongue every 5 (five) minutes as needed for chest pain.   Yes [provider]  potassium chloride (K-DUR) 10 MEQ tablet Take 1 tablet (10 mEq total) by mouth 2 (two) times daily for 3 days. 11/25/18 01/31/21 Yes Domenick Gong, MD  PROLIA 60 MG/ML SOSY injection Inject into the skin. 01/04/21  Yes [provider]  quinapril (ACCUPRIL) 20 MG tablet Take 20 mg by mouth daily.    Yes [provider]  traMADol (ULTRAM) 50 MG tablet Take 1 tablet (50 mg total) by mouth every 6 (six) hours as needed. 01/31/21  Yes Becky Augusta, NP  escitalopram (LEXAPRO) 10 MG tablet Take 10 mg by mouth daily.  11/25/18  [provider]    Family History Family History  Problem Relation Age of Onset   Breast cancer Paternal Aunt    Diabetes Mother    Hypertension Mother    Heart attack Mother     Social History Social History   Tobacco Use   Smoking status: Former    Types: Cigarettes   Smokeless tobacco: Never  Vaping Use   Vaping Use: Never used  Substance Use Topics   Alcohol use: No   Drug use: No     Allergies   Codeine, Adhesive [tape], Diazepam, and Ezetimibe   Review of Systems Review of Systems  Musculoskeletal:  Positive for neck pain and neck stiffness.  Neurological:  Negative for weakness, numbness and headaches.  Hematological: Negative.   Psychiatric/Behavioral: Negative.      Physical Exam Triage Vital Signs ED Triage Vitals  Enc Vitals Group     BP      Pulse      Resp      Temp      Temp src      SpO2      Weight      Height      Head Circumference      Peak Flow      Pain Score      Pain Loc      Pain Edu?      Excl. in GC?    No data found.  Updated Vital Signs BP (!) 123/55 (BP Location: Left Arm)   Pulse 63   Temp 99.2 F (37.3 C) (Oral)   Resp 18   Ht 5\' 1"  (1.549 m)   Wt 172 lb (78 kg)   SpO2 99%   BMI  32.50 kg/m   Visual Acuity Right Eye Distance:   Left Eye Distance:  Bilateral Distance:    Right Eye Near:   Left Eye Near:    Bilateral Near:     Physical Exam Vitals and nursing note reviewed.  Constitutional:      General: She is not in acute distress.    Appearance: Normal appearance. She is normal weight. She is not ill-appearing.  HENT:     Head: Normocephalic and atraumatic.  Musculoskeletal:        General: Tenderness present. No swelling, deformity or signs of injury.  Skin:    General: Skin is warm and dry.     Capillary Refill: Capillary refill takes less than 2 seconds.     Findings: No bruising or erythema.  Neurological:     General: No focal deficit present.     Mental Status: She is alert and oriented to person, place, and time.  Psychiatric:        Mood and Affect: Mood normal.        Behavior: Behavior normal.        Thought Content: Thought content normal.        Judgment: Judgment normal.     UC Treatments / Results  Labs (all labs ordered are listed, but only abnormal results are displayed) Labs Reviewed - No data to display  EKG   Radiology DG Cervical Spine Complete  Result Date: 01/31/2021 CLINICAL DATA:  85 year old female with increasing neck pain. EXAM: CERVICAL SPINE - COMPLETE 4+ VIEW COMPARISON:  CT cervical spine 10/24/2009. FINDINGS: Upright views. Chronic straightening of cervical lordosis. Cervicothoracic junction alignment is within normal limits. Normal prevertebral soft tissue contour. Chronic C5-C6 interbody fusion. Bilateral posterior element alignment is within normal limits. Stable cervical AP alignment. Stable C1-C2 alignment with mild joint space loss. No acute osseous abnormality identified. Advanced chronic disc space loss and endplate spurring at the adjacent C6-C7 segment. And there is progressed and moderate bilateral upper cervical facet hypertrophy which appears maximal at C3-C4 and C4-C5. Left cervical calcified  carotid atherosclerosis. Negative visible upper chest. IMPRESSION: 1. No acute osseous abnormality identified in the cervical spine. Chronic C5-C6 interbody fusion. 2. 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 Progressed and moderate facet arthropathy at C3-C4 and C4-C5. Electronically Signed   By: Odessa FlemingH  Hall M.D.   On: 01/31/2021 09:19    Procedures Procedures (including critical care time)  Medications Ordered in UC Medications - No data to display  Initial Impression / Assessment and Plan / UC Course  I have reviewed the triage vital signs and the nursing notes.  Pertinent labs & imaging results that were available during my care of the patient were reviewed by me and considered in my medical decision making (see chart for details).  Patient is a very pleasant, nontoxic-appearing 85 year old female here for evaluation of left-sided neck pain that radiates to her left shoulder and started 2 days ago.  There is no associated injury that she is aware of though she does have history of pathologic fractures in her neck.  Patient's physical exam reveals a neck that is in normal anatomical alignment.  Patient has full range of motion but she does have a slight decrease in range of motion when she attempts to rotate her neck to the left.  Patient does have C6 midline spinous process tenderness as well as left sided paraspinous tenderness and spasm.  There is some tension in the superior lateral aspect of the left trapezius as well.  Bilateral upper extremity strength is 5/5 and her bilateral grips are  5/5.  Due to the history of pathological fractures I will obtain a C-spine series to look for any new fractures.  Patient vies that if there are any concerning fractures present we will send her to the emergency department but if there are no new fractures present we will treat her pain and muscle spasm and have her follow-up with her spinal specialist on Monday.  Cervical spine films  independently reviewed and evaluated by me.  Impression: There is significant bone density loss throughout the vertebral bodies of the cervical spine.  There is a cervical fusion at C5-C6.  There is no evident fracture or dislocation.  There is a loss of cervical lordosis.  Awaiting radiology overread. Radiology interpretation is that there is no acute osseous abnormality in the cervical spine.  There is chronic C5-C6 interbody fusion and progressed adjacent segment disease at C6-C7 since 2011.  Also progressed and moderate facet arthropathy of C3-C4 and C4-C5.  We will treat patient for cervical spasm with baclofen   Final Clinical Impressions(s) / UC Diagnoses   Final diagnoses:  Spasm of cervical paraspinous muscle     Discharge Instructions      Continue use Tylenol for mild to moderate pain and use the tramadol as needed for severe pain.  Be mindful that this may make you dizzy so do not drive a vehicle or drink alcohol if you take it.  Also be cautious when changing positions from laying or sitting to standing.  Transition slowly and in stages.  Take the baclofen 3 times a day to help with muscle spasm.  I would avoid using your TENS unit for the next 24 to 48 hours until the muscle relaxers have had a chance to work.  After that you can try to low settings to see if it helps.  Follow-up with your spinal specialist on Monday.     ED Prescriptions     Medication Sig Dispense Auth. Provider   baclofen (LIORESAL) 10 MG tablet Take 1 tablet (10 mg total) by mouth 3 (three) times daily. 30 each Becky Augusta, NP   traMADol (ULTRAM) 50 MG tablet Take 1 tablet (50 mg total) by mouth every 6 (six) hours as needed. 15 tablet Becky Augusta, NP      I have reviewed the PDMP during this encounter.   Becky Augusta, NP 01/31/21 [phone]

## 2021-02-03 ENCOUNTER — Other Ambulatory Visit: Payer: Self-pay | Admitting: Physical Medicine & Rehabilitation

## 2021-02-03 DIAGNOSIS — M542 Cervicalgia: Secondary | ICD-10-CM

## 2021-02-10 ENCOUNTER — Ambulatory Visit: Payer: Medicare HMO

## 2021-02-16 ENCOUNTER — Ambulatory Visit
Admission: RE | Admit: 2021-02-16 | Discharge: 2021-02-16 | Disposition: A | Discharge status: Home / Self Care | Payer: Medicare HMO | Source: Ambulatory Visit | Attending: Physical Medicine & Rehabilitation | Admitting: Physical Medicine & Rehabilitation

## 2021-02-16 ENCOUNTER — Other Ambulatory Visit: Payer: Self-pay

## 2021-02-16 DIAGNOSIS — M542 Cervicalgia: Secondary | ICD-10-CM | POA: Insufficient documentation

## 2021-03-04 ENCOUNTER — Other Ambulatory Visit: Payer: Self-pay | Admitting: Family Medicine

## 2021-03-04 DIAGNOSIS — Z1231 Encounter for screening mammogram for malignant neoplasm of breast: Secondary | ICD-10-CM

## 2021-05-26 ENCOUNTER — Ambulatory Visit
Admission: RE | Admit: 2021-05-26 | Discharge: 2021-05-26 | Disposition: A | Discharge status: Home / Self Care | Payer: Medicare HMO | Source: Ambulatory Visit | Attending: Family Medicine | Admitting: Family Medicine

## 2021-05-26 ENCOUNTER — Other Ambulatory Visit: Payer: Self-pay

## 2021-05-26 DIAGNOSIS — Z1231 Encounter for screening mammogram for malignant neoplasm of breast: Secondary | ICD-10-CM | POA: Diagnosis present

## 2021-11-16 ENCOUNTER — Encounter: Payer: Self-pay | Admitting: Emergency Medicine

## 2021-11-16 ENCOUNTER — Ambulatory Visit (INDEPENDENT_AMBULATORY_CARE_PROVIDER_SITE_OTHER): Payer: Medicare HMO

## 2021-11-16 ENCOUNTER — Ambulatory Visit
Admission: EM | Admit: 2021-11-16 | Discharge: 2021-11-16 | Disposition: A | Discharge status: Home / Self Care | Payer: Medicare HMO | Attending: Emergency Medicine | Admitting: Emergency Medicine

## 2021-11-16 DIAGNOSIS — R0782 Intercostal pain: Secondary | ICD-10-CM

## 2021-11-16 DIAGNOSIS — S20212A Contusion of left front wall of thorax, initial encounter: Secondary | ICD-10-CM | POA: Diagnosis not present

## 2021-11-16 DIAGNOSIS — W19XXXA Unspecified fall, initial encounter: Secondary | ICD-10-CM | POA: Diagnosis not present

## 2021-11-16 MED ORDER — TRAMADOL HCL 50 MG PO TABS: 50.0000 mg | ORAL_TABLET | Freq: Four times a day (QID) | ORAL | 0 refills | Status: DC | PRN

## 2021-11-16 NOTE — Discharge Instructions (Addendum)
Your chest x-ray today did not reveal the presence of any broken ribs.  I do believe that you have bruised your chest wall as a result of your fall.  When she take over-the-counter Tylenol Corte to the package instructions as needed for mild to moderate pain.  Use the tramadol as needed for severe pain.  Be mindful that this medication may make you drowsy or more unsteady so be careful if you take it.  Do not drive a vehicle if you take this medication.  You can apply ice to your chest wall for 20 minutes at a time 2-3 times a day to aid in pain relief.  Do not apply the ice directly to your skin, always make sure there is a cloth between your skin and the ice.  Return for reevaluation, or see your PCP, for continued or worsening symptoms.

## 2021-11-16 NOTE — ED Provider Notes (Signed)
[facility]    CSN: [id number] Arrival date & time: 11/16/21  1619      History   Chief Complaint Chief Complaint  Patient presents with   Fall   rib cage pain    HPI Jane [name] is a 86 y.o. female.   HPI  86 year old female here for evaluation of left rib pain.  Patient reports that she was attempting to walk around a piece of exercise equipment this morning when her foot caught causing her to fall.  When she fell she impacted her left arm and her left ribs on one of the bars of the exercise equipment.  Since then she has had pain with breathing and movement.  She also endorses some mild shortness of breath.  She states she did not hit her head and did not have a loss of consciousness.  She has not had any cough.  Patient does have a large bruise to the posterior of her left upper arm.  Past Medical History:  Diagnosis Date   Diverticulitis    Hiatal hernia    Hyperlipidemia    Hypertension    Lymphocytic colitis    Mitral valve prolapse    Osteoporosis    Reflux     Patient Active Problem List   Diagnosis Date Noted   Unstable angina (HCC) 12/08/2017   Pelvic fracture (HCC) 11/25/2016    Past Surgical History:  Procedure Laterality Date   APPENDECTOMY     CARDIAC CATHETERIZATION     CATARACT EXTRACTION     CERVICAL DISC SURGERY     CHOLECYSTECTOMY     CORONARY STENT PLACEMENT     FOOT FRACTURE SURGERY     hemangioma     TONSILLECTOMY     TUBAL LIGATION      OB History   No obstetric history on file.      Home Medications    Prior to Admission medications   Medication Sig Start Date End Date Taking? Authorizing Provider  traMADol (ULTRAM) 50 MG tablet Take 1 tablet (50 mg total) by mouth every 6 (six) hours as needed. 11/16/21  Yes Becky Augustayan, Rhylie Stehr, NP  acetaminophen (TYLENOL) 500 MG tablet Take 500 mg by mouth every 6 (six) hours as needed for mild pain or moderate pain.     [provider]  aspirin EC 81 MG tablet Take 81 mg  by mouth daily.    [provider]  atenolol (TENORMIN) 50 MG tablet Take 50 mg by mouth daily. 09/30/16   [provider]  baclofen (LIORESAL) 10 MG tablet Take 1 tablet (10 mg total) by mouth 3 (three) times daily. 01/31/21   Becky Augustayan, Ishani Goldwasser, NP  calcium carbonate (TUMS - DOSED IN MG ELEMENTAL CALCIUM) 500 MG chewable tablet Chew 2 tablets by mouth daily.    [provider]  carvedilol (COREG) 6.25 MG tablet Take 1 tablet (6.25 mg total) by mouth 2 (two) times daily. 12/09/17   Shaune Pollackhen, Qing, MD  chlorthalidone (HYGROTON) 25 MG tablet Take 12.5 mg by mouth daily. 01/18/21   [provider]  cholecalciferol (VITAMIN D) 1000 units tablet Take 2,000 Units by mouth daily.     [provider]  citalopram (CELEXA) 20 MG tablet Take 20 mg by mouth daily.    [provider]  clopidogrel (PLAVIX) 75 MG tablet Take 1 tablet by mouth daily. 10/12/15   [provider]  diphenoxylate-atropine (LOMOTIL) 2.5-0.025 MG tablet Take by mouth 4 (four) times daily as needed for  diarrhea or loose stools.    [provider]  isosorbide dinitrate (ISORDIL) 30 MG tablet Take 30 mg by mouth 2 (two) times daily.     [provider]  isosorbide mononitrate (IMDUR) 30 MG 24 hr tablet Take 30 mg by mouth 2 (two) times daily. 01/20/21   [provider]  labetalol (NORMODYNE) 100 MG tablet Take by mouth. 10/12/15   [provider]  Multiple Vitamins-Minerals (CENTRUM PO) Take 1 tablet by mouth daily.     [provider]  nitroGLYCERIN (NITROSTAT) 0.4 MG SL tablet Place 0.4 mg under the tongue every 5 (five) minutes as needed for chest pain.    [provider]  potassium chloride (K-DUR) 10 MEQ tablet Take 1 tablet (10 mEq total) by mouth 2 (two) times daily for 3 days. 11/25/18 01/31/21  Domenick Gong, MD  PROLIA 60 MG/ML SOSY injection Inject into the skin. 01/04/21   [provider]  quinapril (ACCUPRIL) 20 MG tablet  Take 20 mg by mouth daily.     [provider]  escitalopram (LEXAPRO) 10 MG tablet Take 10 mg by mouth daily.  11/25/18  [provider]    Family History Family History  Problem Relation Age of Onset   Breast cancer Paternal Aunt    Diabetes Mother    Hypertension Mother    Heart attack Mother     Social History Social History   Tobacco Use   Smoking status: Former    Types: Cigarettes   Smokeless tobacco: Never  Vaping Use   Vaping Use: Never used  Substance Use Topics   Alcohol use: No   Drug use: No     Allergies   Codeine, Adhesive [tape], Diazepam, and Ezetimibe   Review of Systems Review of Systems  Respiratory:  Positive for shortness of breath. Negative for cough.        Left-sided chest wall pain.     Physical Exam Triage Vital Signs ED Triage Vitals [11/16/21 1645]  Enc Vitals Group     BP      Pulse      Resp      Temp      Temp src      SpO2      Weight      Height      Head Circumference      Peak Flow      Pain Score 9     Pain Loc      Pain Edu?      Excl. in GC?    No data found.  Updated Vital Signs BP (!) 120/59 (BP Location: Left Arm)   Pulse 60   Temp 98 F (36.7 C) (Oral)   Resp 17   SpO2 97%   Visual Acuity Right Eye Distance:   Left Eye Distance:   Bilateral Distance:    Right Eye Near:   Left Eye Near:    Bilateral Near:     Physical Exam Vitals and nursing note reviewed.  Constitutional:      Appearance: Normal appearance. She is not ill-appearing.  HENT:     Head: Normocephalic and atraumatic.  Cardiovascular:     Rate and Rhythm: Normal rate and regular rhythm.     Pulses: Normal pulses.     Heart sounds: Normal heart sounds. No murmur heard.    No friction rub. No gallop.  Pulmonary:     Effort: Pulmonary effort is normal.     Breath sounds: Normal  breath sounds. No wheezing, rhonchi or rales.  Chest:     Chest wall: Tenderness present.  Skin:    General: Skin is warm and dry.      Capillary Refill: Capillary refill takes less than 2 seconds.     Findings: Bruising present. No erythema.  Neurological:     General: No focal deficit present.     Mental Status: She is alert and oriented to person, place, and time.  Psychiatric:        Mood and Affect: Mood normal.        Behavior: Behavior normal.        Thought Content: Thought content normal.        Judgment: Judgment normal.      UC Treatments / Results  Labs (all labs ordered are listed, but only abnormal results are displayed) Labs Reviewed - No data to display  EKG   Radiology DG Ribs Unilateral W/Chest Left  Result Date: 11/16/2021 CLINICAL DATA:  Left rib pain, fall EXAM: LEFT RIBS AND CHEST - 3+ VIEW COMPARISON:  None Available. FINDINGS: No fracture or other bone lesions are seen involving the ribs. There is no evidence of pneumothorax or pleural effusion. Both lungs are clear. Heart size and mediastinal contours are within normal limits. IMPRESSION: Negative. Electronically Signed   By: Helyn Numbers M.D.   On: 11/16/2021 17:16    Procedures Procedures (including critical care time)  Medications Ordered in UC Medications - No data to display  Initial Impression / Assessment and Plan / UC Course  I have reviewed the triage vital signs and the nursing notes.  Pertinent labs & imaging results that were available during my care of the patient were reviewed by me and considered in my medical decision making (see chart for details).  Is a very pleasant, nontoxic-appearing 86 year old female here for evaluation of left-sided rib pain after suffering a fall this morning.  She states that she fell into a piece of exercise equipment when her left foot caught causing her to lose her balance.  She states that she did not hit her head and did not suffer loss of consciousness.  Her ribs impacted one of the bars on the side of the machine.  On exam patient has normal chest excursion and is able to speak in  full sentences without any respiratory difficulty.  She does have tenderness with palpation of her fourth and fifth rib in the mid axillary line on the left.  There is no overlying erythema or ecchymosis noted.  No crepitus appreciated with palpation.  Her lungs are clear to auscultation in all fields.  Patient does have a large bruise on the superior, posterior medial aspect of her left upper arm.  She is moving her arm and shoulder without any difficulty.  I will obtain left rib series to look for the presence of broken ribs.  Left rib films independently reviewed and evaluated by me.  Impression: No evidence of rib fracture noted.  Awaiting radiology overread. Radiology impression states that there is no fracture or other bone lesions seen involving the ribs.  No evidence of pneumothorax or pleural effusion.  Negative exam.  I will discharge patient home with a diagnosis of chest wall contusion and have her use over-the-counter Tylenol as needed for mild to moderate pain and will give her prescription for tramadol for more severe pain.   Final Clinical Impressions(s) / UC Diagnoses   Final diagnoses:  Contusion of chest wall, left, initial encounter  Discharge Instructions      Your chest x-ray today did not reveal the presence of any broken ribs.  I do believe that you have bruised your chest wall as a result of your fall.  When she take over-the-counter Tylenol Corte to the package instructions as needed for mild to moderate pain.  Use the tramadol as needed for severe pain.  Be mindful that this medication may make you drowsy or more unsteady so be careful if you take it.  Do not drive a vehicle if you take this medication.  You can apply ice to your chest wall for 20 minutes at a time 2-3 times a day to aid in pain relief.  Do not apply the ice directly to your skin, always make sure there is a cloth between your skin and the ice.  Return for reevaluation, or see your PCP, for  continued or worsening symptoms.     ED Prescriptions     Medication Sig Dispense Auth. Provider   traMADol (ULTRAM) 50 MG tablet Take 1 tablet (50 mg total) by mouth every 6 (six) hours as needed. 15 tablet Becky Augusta, NP      I have reviewed the PDMP during this encounter.   Becky Augusta, NP 11/16/21 1725

## 2021-11-16 NOTE — ED Triage Notes (Signed)
Pt reports left side rib cage pain after a fall today. States she fell and hit her left side on metal exercise equipment and since then has pain when taking a deep breath.

## 2021-12-27 ENCOUNTER — Other Ambulatory Visit: Payer: Self-pay | Admitting: Family Medicine

## 2021-12-27 DIAGNOSIS — Z1231 Encounter for screening mammogram for malignant neoplasm of breast: Secondary | ICD-10-CM

## 2022-05-31 ENCOUNTER — Ambulatory Visit
Admission: RE | Admit: 2022-05-31 | Discharge: 2022-05-31 | Disposition: A | Discharge status: Home / Self Care | Payer: Medicare HMO | Source: Ambulatory Visit | Attending: Family Medicine | Admitting: Family Medicine

## 2022-05-31 DIAGNOSIS — Z1231 Encounter for screening mammogram for malignant neoplasm of breast: Secondary | ICD-10-CM | POA: Diagnosis present

## 2022-08-24 ENCOUNTER — Other Ambulatory Visit: Payer: Self-pay

## 2022-08-24 ENCOUNTER — Emergency Department: Payer: Medicare HMO

## 2022-08-24 ENCOUNTER — Emergency Department
Admission: EM | Admit: 2022-08-24 | Discharge: 2022-08-24 | Disposition: A | Discharge status: Home / Self Care | Payer: Medicare HMO | Attending: Emergency Medicine | Admitting: Emergency Medicine

## 2022-08-24 ENCOUNTER — Encounter: Payer: Self-pay | Admitting: Emergency Medicine

## 2022-08-24 ENCOUNTER — Ambulatory Visit
Admission: EM | Admit: 2022-08-24 | Discharge: 2022-08-24 | Disposition: A | Discharge status: Home / Self Care | Payer: Medicare HMO | Attending: Family Medicine | Admitting: Family Medicine

## 2022-08-24 DIAGNOSIS — R300 Dysuria: Secondary | ICD-10-CM | POA: Insufficient documentation

## 2022-08-24 DIAGNOSIS — I700 Atherosclerosis of aorta: Secondary | ICD-10-CM | POA: Insufficient documentation

## 2022-08-24 DIAGNOSIS — E119 Type 2 diabetes mellitus without complications: Secondary | ICD-10-CM | POA: Diagnosis not present

## 2022-08-24 DIAGNOSIS — R079 Chest pain, unspecified: Secondary | ICD-10-CM | POA: Diagnosis present

## 2022-08-24 DIAGNOSIS — R42 Dizziness and giddiness: Secondary | ICD-10-CM

## 2022-08-24 DIAGNOSIS — I2510 Atherosclerotic heart disease of native coronary artery without angina pectoris: Secondary | ICD-10-CM | POA: Insufficient documentation

## 2022-08-24 DIAGNOSIS — I10 Essential (primary) hypertension: Secondary | ICD-10-CM | POA: Diagnosis not present

## 2022-08-24 DIAGNOSIS — R0600 Dyspnea, unspecified: Secondary | ICD-10-CM | POA: Diagnosis not present

## 2022-08-24 LAB — PROTIME-INR
INR: 0.9 (ref 0.8–1.2)
Prothrombin Time: 12.5 seconds (ref 11.4–15.2)

## 2022-08-24 LAB — BASIC METABOLIC PANEL
Anion gap: 12 (ref 5–15)
BUN: 16 mg/dL (ref 8–23)
CO2: 25 mmol/L (ref 22–32)
Calcium: 9.5 mg/dL (ref 8.9–10.3)
Chloride: 90 mmol/L — ABNORMAL LOW (ref 98–111)
Creatinine, Ser: 0.97 mg/dL (ref 0.44–1.00)
GFR, Estimated: 57 mL/min — ABNORMAL LOW (ref >60)
Glucose, Bld: 121 mg/dL — ABNORMAL HIGH (ref 70–99)
Potassium: 3.8 mmol/L (ref 3.5–5.1)
Sodium: 127 mmol/L — ABNORMAL LOW (ref 135–145)

## 2022-08-24 LAB — URINALYSIS, W/ REFLEX TO CULTURE (INFECTION SUSPECTED)
Bacteria, UA: NONE SEEN
Bilirubin Urine: NEGATIVE
Glucose, UA: NEGATIVE mg/dL
Hgb urine dipstick: NEGATIVE
Ketones, ur: 5 mg/dL — AB
Nitrite: NEGATIVE
Protein, ur: NEGATIVE mg/dL
Specific Gravity, Urine: 1.016 (ref 1.005–1.030)
pH: 5 (ref 5.0–8.0)

## 2022-08-24 LAB — HEPATIC FUNCTION PANEL
ALT: 21 U/L (ref 0–44)
AST: 25 U/L (ref 15–41)
Albumin: 3.5 g/dL (ref 3.5–5.0)
Alkaline Phosphatase: 39 U/L (ref 38–126)
Bilirubin, Direct: 0.1 mg/dL (ref 0.0–0.2)
Indirect Bilirubin: 0.8 mg/dL (ref 0.3–0.9)
Total Bilirubin: 0.9 mg/dL (ref 0.3–1.2)
Total Protein: 6.7 g/dL (ref 6.5–8.1)

## 2022-08-24 LAB — BRAIN NATRIURETIC PEPTIDE: B Natriuretic Peptide: 93.1 pg/mL (ref 0.0–100.0)

## 2022-08-24 LAB — CBC
HCT: 39.1 % (ref 36.0–46.0)
Hemoglobin: 13.5 g/dL (ref 12.0–15.0)
MCH: 32.6 pg (ref 26.0–34.0)
MCHC: 34.5 g/dL (ref 30.0–36.0)
MCV: 94.4 fL (ref 80.0–100.0)
Platelets: 257 10*3/uL (ref 150–400)
RBC: 4.14 MIL/uL (ref 3.87–5.11)
RDW: 13 % (ref 11.5–15.5)
WBC: 8.6 10*3/uL (ref 4.0–10.5)
nRBC: 0 % (ref 0.0–0.2)

## 2022-08-24 LAB — TROPONIN I (HIGH SENSITIVITY)
Troponin I (High Sensitivity): 9 ng/L (ref <18)
Troponin I (High Sensitivity): 10 ng/L (ref <18)

## 2022-08-24 LAB — GLUCOSE, CAPILLARY: Glucose-Capillary: 140 mg/dL — ABNORMAL HIGH (ref 70–99)

## 2022-08-24 LAB — D-DIMER, QUANTITATIVE: D-Dimer, Quant: 0.6 ug/mL-FEU — ABNORMAL HIGH (ref 0.00–0.50)

## 2022-08-24 MED ORDER — SODIUM CHLORIDE 0.9 % IV BOLUS: 1000.0000 mL | Freq: Once | INTRAVENOUS | Status: DC

## 2022-08-24 MED ORDER — ACETAMINOPHEN 500 MG PO TABS
1000.0000 mg | ORAL_TABLET | Freq: Once | ORAL | Status: AC
  Administered 2022-08-24: 1000 mg via ORAL
  Filled 2022-08-24: qty 2

## 2022-08-24 MED ORDER — ONDANSETRON HCL 4 MG/2ML IJ SOLN
4.0000 mg | Freq: Once | INTRAMUSCULAR | Status: DC
  Filled 2022-08-24: qty 2

## 2022-08-24 MED ORDER — IOHEXOL 350 MG/ML SOLN
75.0000 mL | Freq: Once | INTRAVENOUS | Status: AC | PRN
  Administered 2022-08-24: 75 mL via INTRAVENOUS

## 2022-08-24 MED ORDER — CEPHALEXIN 500 MG PO CAPS: 500.0000 mg | ORAL_CAPSULE | Freq: Four times a day (QID) | ORAL | 0 refills | Status: AC

## 2022-08-24 MED ORDER — CEPHALEXIN 500 MG PO CAPS: 500.0000 mg | ORAL_CAPSULE | Freq: Four times a day (QID) | ORAL | 0 refills | Status: DC

## 2022-08-24 MED ORDER — CEPHALEXIN 500 MG PO CAPS
500.0000 mg | ORAL_CAPSULE | Freq: Once | ORAL | Status: AC
  Administered 2022-08-24: 500 mg via ORAL
  Filled 2022-08-24: qty 1

## 2022-08-24 MED ORDER — SODIUM CHLORIDE 0.9 % IV BOLUS
1000.0000 mL | Freq: Once | INTRAVENOUS | Status: AC
  Administered 2022-08-24: 1000 mL via INTRAVENOUS

## 2022-08-24 MED ORDER — ALPRAZOLAM 0.5 MG PO TABS: 0.5000 mg | ORAL_TABLET | Freq: Once | ORAL | Status: DC | PRN

## 2022-08-24 NOTE — ED Notes (Addendum)
Pt via POV from home. Pt is accompanied by husband. Pt has multiple complaints. Pt's main complaint concerns dizziness and nausea when she stands, when pt states she has been lightheaded. Stated that she also been having intermittent chest pressure that has been there "for months" Pt has a hx of multiple stents per pt. Also states she feel SOB on exertion. Pt is A&OX4 and NAD but unsteady when standing. Pt does take Plavix.

## 2022-08-24 NOTE — ED Notes (Signed)
Patient is being discharged from the Urgent Care and sent to the Emergency Department via POV . Per Dr. Susa Simmonds, patient is in need of higher level of care due to Dizziness and chest pain. Patient is aware and verbalizes understanding of plan of care.  Vitals:   08/24/22 1323 08/24/22 1324  BP: 125/86   Pulse: 71   Resp: 16 18  Temp: 97.8 F (36.6 C)   SpO2: 98%

## 2022-08-24 NOTE — Discharge Instructions (Signed)
Drink plenty of fluids to stay well-hydrated.  Take antibiotics for a potential urine infection.  Thank you for choosing [place] for your health care today!  Please see your primary doctor this week for a follow up appointment.   Sometimes, in the early stages of certain disease courses it is difficult to detect in the emergency department evaluation -- so, it is important that you continue to monitor your symptoms and call your doctor right away or return to the emergency department if you develop any new or worsening symptoms.  Please go to the following website to schedule new (and existing) patient appointments:   [email or web address]  If you do not have a primary doctor try calling the following clinics to establish care:  If you have insurance:  [facility] [phone] [place] [address]   [facility]  [phone] [place] [place] [address]   If you do not have insurance:  [facility]  [phone] [address], [place] [address]   The following is another list of primary care offices in the area who are accepting new patients at this time.  Please reach out to one of them directly and let them know you would like to schedule an appointment to follow up on an Emergency Department visit, and/or to establish a new primary care provider (PCP).  There are likely other primary care clinics in the are who are accepting new patients, but this is an excellent place to start:  [place] physician: Dr Lavon Paganini [address] [place], [place] [address] [phone]  [facility] Lead Physician: Dr Steele Sizer [address], [place], [place] [address] [phone]  [place] Physician: Dr Park Liter [address] [place], [place] [address] [phone]  [facility] Lead Physician:  Dr Dewaine Oats [place], [place], [place] [address] [phone]  [place] [facility] [place] Physician: Dr Halina Maidens [address] [place] Broach [place], [place] [address] [phone]   It was my pleasure to care for you today.   Hoover Brunette Jacelyn Grip, MD

## 2022-08-24 NOTE — ED Triage Notes (Signed)
Pt states she had diarrhea 5 days ago and she took imodium and the symptoms went away the next day. She then developed dizziness. She fel extremely dizzy 3 days ago that she past out and her husband found her on the floor she does not know how long she was out. She did not call EMS, but she did call her PCP the next day to get an appointment and they advised her to be seen.

## 2022-08-24 NOTE — ED Notes (Signed)
Pt transported to CT at this time.

## 2022-08-24 NOTE — Discharge Instructions (Addendum)
Given your persistent dizziness and chest discomfort we discussed you going to be evaluated in the emergency department.  You have been advised to follow up immediately in the emergency department for concerning signs or symptoms as discussed during your visit. If you declined EMS transport, please have a family member take you directly to the ED at this time. Do not delay.   Based on concerns about condition, if you do not follow up in the ED, you may risk poor outcomes including worsening of condition, delayed treatment and potentially life threatening issues. If you have declined to go to the ED at this time, you should call your PCP immediately to set up a follow up appointment.   Go to ED for red flag symptoms, including; fevers you cannot reduce with Tylenol/Motrin, severe headaches, vision changes, numbness/weakness in part of the body, lethargy, confusion, intractable vomiting, severe dehydration, chest pain, breathing difficulty, severe persistent abdominal or pelvic pain, signs of severe infection (increased redness, swelling of an area), feeling faint or passing out, dizziness, etc. You should especially go to the ED for sudden acute worsening of condition if you do not elect to go at this time.

## 2022-08-24 NOTE — ED Triage Notes (Signed)
Patient to ED from [facility] for chest pain and dizziness since Friday. Centralized CP described as pressure. States she has had syncope episode at home on Sunday or Monday. States unsure if hit head. States she had a colitis flair-up with diarrhea and has been drinking lost of water to try and prevent dehydration. Hx of stents.

## 2022-08-24 NOTE — ED Notes (Signed)
Dr. Jacelyn Grip EDP at bedside for evaluation at this time.

## 2022-08-24 NOTE — ED Provider Notes (Signed)
[facility] Provider Note    Event Date/Time   First MD Initiated Contact with Patient 08/24/22 1710     (approximate)   History   Chest Pain   HPI  Jane [name] is a 87 y.o. female   Past medical history of CAD, diabetes, lymphocytic colitis, hypertension hyperlipidemia, no medical chart documentation of congestive heart failure the patient states that she has heart failure and takes a diuretic, I did review a NM myocardial perfusion SPECT test from January 2022 with a normal exam and ejection fraction of 70%  Comes to the emergency department with diarrheal illness on Friday yellow watery stool multiple episodes; no abdominal pain; fortunately completely resolved and that same day. No blood or melena  Then over the weekend has had chest pressure substernally; she states years of the same symptoms not worsening or changed at all.  She denies any respiratory infectious symptoms like cough or fever..    However she has had shortness of breath with exertion and lightheadedness upon standing. She denies any GI or GU complaints currently.    Independent Historian contributed to assessment above: Husband who is at the bedside  External Medical Documents Reviewed: Note from urgent care earlier today that documented hypotension 80/60 and notes that the patient had a fall on Sunday      Physical Exam   Triage Vital Signs: ED Triage Vitals [08/24/22 1435]  Enc Vitals Group     BP (!) 143/57     Pulse Rate (!) 108     Resp (!) 22     Temp 98.2 F (36.8 C)     Temp Source Oral     SpO2 96 %     Weight      Height      Head Circumference      Peak Flow      Pain Score 7     Pain Loc      Pain Edu?      Excl. in [place]?     Most recent vital signs: Vitals:   08/24/22 2100 08/24/22 2109  BP: (!) 142/71   Pulse: (!) 57   Resp: 16   Temp:  98.9 F (37.2 C)  SpO2: 100%     General: Awake, no distress.  CV:  Good peripheral perfusion.  Mucous  membranes are slightly dry, skin turgor poor. Resp:  Normal effort.  Abd:  No distention.  Other:  She is hypertensive 140/50 and tachycardic 100 initially but normalized on recheck her some fluids.  Afebrile.  Abdomen soft and nontender and lungs are clear to auscultation without wheezing focality or rales.  When she stands she feels lightheaded.  She has no other focal neurologic deficits including dysarthria facial asymmetry motor or sensory deficits.   ED Results / Procedures / Treatments   Labs (all labs ordered are listed, but only abnormal results are displayed) Labs Reviewed  BASIC METABOLIC PANEL - Abnormal; Notable for the following components:      Result Value   Sodium 127 (*)    Chloride 90 (*)    Glucose, Bld 121 (*)    GFR, Estimated 57 (*)    All other components within normal limits  URINALYSIS, W/ REFLEX TO CULTURE (INFECTION SUSPECTED) - Abnormal; Notable for the following components:   Color, Urine YELLOW (*)    APPearance CLEAR (*)    Ketones, ur 5 (*)    Leukocytes,Ua TRACE (*)    All other components  within normal limits  D-DIMER, QUANTITATIVE - Abnormal; Notable for the following components:   D-Dimer, Quant 0.60 (*)    All other components within normal limits  URINE CULTURE  CBC  PROTIME-INR  HEPATIC FUNCTION PANEL  BRAIN NATRIURETIC PEPTIDE  TROPONIN I (HIGH SENSITIVITY)  TROPONIN I (HIGH SENSITIVITY)     I ordered and reviewed the above labs they are notable for her sodium is slightly low at 127 and chloride is 90.  Troponin is flat at 9, 10  EKG  ED ECG REPORT I, Lucillie Garfinkel, the attending physician, personally viewed and interpreted this ECG.   Date: 08/24/2022  EKG Time: 1437  Rate: 71  Rhythm: NSR  Axis: nl  Intervals:none  ST&T Change: No acute ischemic changes    RADIOLOGY I independently reviewed and interpreted chest x-ray and see no obvious focality pneumothorax   PROCEDURES:  Critical Care performed:  No  Procedures   MEDICATIONS ORDERED IN ED: Medications  ondansetron (ZOFRAN) injection 4 mg (0 mg Intravenous Hold 08/24/22 1818)  ALPRAZolam (XANAX) tablet 0.5 mg (has no administration in time range)  iohexol (OMNIPAQUE) 350 MG/ML injection 75 mL (75 mLs Intravenous Contrast Given 08/24/22 1800)  sodium chloride 0.9 % bolus 1,000 mL (0 mLs Intravenous Stopped 08/24/22 2051)  acetaminophen (TYLENOL) tablet 1,000 mg (1,000 mg Oral Given 08/24/22 1921)  cephALEXin (KEFLEX) capsule 500 mg (500 mg Oral Given 08/24/22 2011)    IMPRESSION / MDM / ASSESSMENT AND PLAN / ED COURSE  I reviewed the triage vital signs and the nursing notes.                                Patient's presentation is most consistent with acute presentation with potential threat to life or bodily function.  Differential diagnosis includes, but is not limited to, dehydration, electrolyte disturbance, dysrhythmia, CVA, intracranial bleeding from fall, intra-abdominal infection given recent diarrheal illness, urinary tract infection, PE   The patient is on the cardiac monitor to evaluate for evidence of arrhythmia and/or significant heart rate changes.  MDM:    Given the patient's multiple complaints that have a wide differential diagnosis.  Her orthostatic lightheadedness can be attributed to dehydration in the setting of recent diarrheal illness.  Will give crystalloid bolus.  I considered CHF exacerbation as well given the patient's reported history of CHF with no medical documentation of the same.  Her EF was 70% on last check.  And since her imaging and BNP are negative and not suggestive of CHF I will proceed with fluids.  Check ACS given atypical chest pain though unlikely given chronicity of the symptoms, EKG is nonischemic and serial troponins negative very low clinical suspicion for ACS driving or chest pain.  Exertional dyspnea is new.  Given her chest pain along with exertional dyspnea and hypotension from  urgent care I will check a CT angiogram which fortunately is negative for PE.  She initially did not tell me about her fall but urgent care records said that she fell on Sunday so I asked her about that she did indeed fall but did not strike her head and does not take anticoagulants though is on antiplatelets for her CAD history, will check a CT of the head to rule out intracranial bleeding.  I considered hospitalization for admission or observation however given the patient feels better and workup is unremarkable for emergent pathologies today, I think discharge at this time with outpatient  monitoring and follow-up is most appropriate at this time patient and her family is in agreement.  Return precautions were given.        FINAL CLINICAL IMPRESSION(S) / ED DIAGNOSES   Final diagnoses:  Nonspecific chest pain  Dyspnea, unspecified type  Orthostatic lightheadedness     Rx / DC Orders   ED Discharge Orders          Ordered    cephALEXin (KEFLEX) 500 MG capsule  4 times daily,   Status:  Discontinued        08/24/22 2004    cephALEXin (KEFLEX) 500 MG capsule  4 times daily        08/24/22 2004             Note:  This document was prepared using Dragon voice recognition software and may include unintentional dictation errors.    Lucillie Garfinkel, MD 08/24/22 [phone]

## 2022-08-24 NOTE — ED Provider Notes (Signed)
[facility]    CSN: [id number] Arrival date & time: 08/24/22  1238      History   Chief Complaint Chief Complaint  Patient presents with   Diarrhea   Dizziness    HPI Jane [name] is a 87 y.o. female.   HPI  Asked by RN team to see pt in triage   Arneshia presents for dizziness since Friday. Dizziness present even at rest. She passed out "Sunday or Monday" she hit the floor and her husband found her on the kitchen floor. Says "I feel terrible." She sendt her PCP a message who told her to go be seen so she came to the walk-in clinic.    Has some central chest pressure and shortness of breath. Has known heart problems.  BP got down to 83/61 and lowest HR 65. Has lymphocyctic coloitis and has chronic diarrhea. Reports yellow diarrhea recently. Denies fever, abdominal pain, blood in stool.       Past Medical History:  Diagnosis Date   Diverticulitis    Hiatal hernia    Hyperlipidemia    Hypertension    Lymphocytic colitis    Mitral valve prolapse    Osteoporosis    Reflux     Patient Active Problem List   Diagnosis Date Noted   Unstable angina ([place]) 12/08/2017   Pelvic fracture ([place]) 11/25/2016    Past Surgical History:  Procedure Laterality Date   APPENDECTOMY     CARDIAC CATHETERIZATION     CATARACT EXTRACTION     CERVICAL DISC SURGERY     CHOLECYSTECTOMY     CORONARY STENT PLACEMENT     FOOT FRACTURE SURGERY     hemangioma     TONSILLECTOMY     TUBAL LIGATION      OB History   No obstetric history on file.      Home Medications    Prior to Admission medications   Medication Sig Start Date End Date Taking? Authorizing Provider  ALPRAZolam (XANAX) 0.25 MG tablet TAKE 1/2 TO 1 TABLET BY MOUTH EVERY 12 HOURS AS NEEDED 07/19/22  Yes [provider]  carvedilol (COREG) 6.25 MG tablet Take 1 tablet (6.25 mg total) by mouth 2 (two) times daily. 12/09/17  Yes Demetrios Loll, MD  chlorthalidone (HYGROTON) 25 MG tablet Take 12.5 mg by  mouth daily. 01/18/21  Yes [provider]  cholestyramine light (PREVALITE) 4 g packet Take by mouth. 08/23/21  Yes [provider]  citalopram (CELEXA) 20 MG tablet Take 20 mg by mouth daily.   Yes [provider]  clopidogrel (PLAVIX) 75 MG tablet Take 1 tablet by mouth daily. 10/12/15  Yes [provider]  fluticasone (FLONASE) 50 MCG/ACT nasal spray Place into the nose. 05/26/22 05/26/23 Yes [provider]  isosorbide mononitrate (IMDUR) 30 MG 24 hr tablet Take 30 mg by mouth 2 (two) times daily. 01/20/21  Yes [provider]  labetalol (NORMODYNE) 100 MG tablet Take by mouth. 10/12/15  Yes [provider]  nitroGLYCERIN (NITROSTAT) 0.4 MG SL tablet Place 0.4 mg under the tongue every 5 (five) minutes as needed for chest pain.   Yes [provider]  PROLIA 60 MG/ML SOSY injection Inject into the skin. 01/04/21  Yes [provider]  acetaminophen (TYLENOL) 500 MG tablet Take 500 mg by mouth every 6 (six) hours as needed for mild pain or moderate pain.     [provider]  aspirin EC 81 MG tablet Take 81 mg by mouth  daily.    [provider]  atenolol (TENORMIN) 50 MG tablet Take 50 mg by mouth daily. 09/30/16   [provider]  baclofen (LIORESAL) 10 MG tablet Take 1 tablet (10 mg total) by mouth 3 (three) times daily. 01/31/21   Margarette Canada, NP  calcium carbonate (TUMS - DOSED IN MG ELEMENTAL CALCIUM) 500 MG chewable tablet Chew 2 tablets by mouth daily.    [provider]  cephALEXin (KEFLEX) 500 MG capsule Take 1 capsule (500 mg total) by mouth 4 (four) times daily for 5 days. 08/24/22 08/29/22  Lucillie Garfinkel, MD  cholecalciferol (VITAMIN D) 1000 units tablet Take 2,000 Units by mouth daily.     [provider]  diphenoxylate-atropine (LOMOTIL) 2.5-0.025 MG tablet Take by mouth 4 (four) times daily as needed for diarrhea or loose stools.    [provider]  isosorbide  dinitrate (ISORDIL) 30 MG tablet Take 30 mg by mouth 2 (two) times daily.     [provider]  lisinopril (ZESTRIL) 20 MG tablet Take 20 mg by mouth daily.    [provider]  Multiple Vitamins-Minerals (CENTRUM PO) Take 1 tablet by mouth daily.     [provider]  potassium chloride (K-DUR) 10 MEQ tablet Take 1 tablet (10 mEq total) by mouth 2 (two) times daily for 3 days. 11/25/18 01/31/21  Melynda Ripple, MD  quinapril (ACCUPRIL) 20 MG tablet Take 20 mg by mouth daily.     [provider]  traMADol (ULTRAM) 50 MG tablet Take 1 tablet (50 mg total) by mouth every 6 (six) hours as needed. 11/16/21   Margarette Canada, NP  escitalopram (LEXAPRO) 10 MG tablet Take 10 mg by mouth daily.  11/25/18  [provider]    Family History Family History  Problem Relation Age of Onset   Breast cancer Paternal 27    Diabetes Mother    Hypertension Mother    Heart attack Mother     Social History Social History   Tobacco Use   Smoking status: Former    Types: Cigarettes   Smokeless tobacco: Never  Vaping Use   Vaping Use: Never used  Substance Use Topics   Alcohol use: No   Drug use: No     Allergies   Codeine, Adhesive [tape], Diazepam, and Ezetimibe   Review of Systems Review of Systems: negative unless otherwise stated in HPI.      Physical Exam Triage Vital Signs ED Triage Vitals  Enc Vitals Group     BP 08/24/22 1323 125/86     Pulse Rate 08/24/22 1323 71     Resp 08/24/22 1323 16     Temp 08/24/22 1323 97.8 F (36.6 C)     Temp Source 08/24/22 1323 Oral     SpO2 08/24/22 1323 98 %     Weight --      Height --      Head Circumference --      Peak Flow --      Pain Score 08/24/22 1318 0     Pain Loc --      Pain Edu? --      Excl. in [place]? --    No data found.  Updated Vital Signs BP 125/86 (BP Location: Left Arm)   Pulse 71   Temp 97.8 F (36.6 C) (Oral)   Resp 18   SpO2 98%   Visual Acuity Right Eye Distance:    Left Eye Distance:   Bilateral Distance:  Right Eye Near:   Left Eye Near:    Bilateral Near:     Physical Exam GEN:     alert, pleasant elderly female and no distress    HENT:  mucus membranes moist, oropharyngeal without lesions or erythema,  nares patent, no nasal discharge  EYES:   pupils equal and reactive, EOM intact NECK:  supple, baseline ROM RESP:  clear to auscultation bilaterally, no increased work of breathing  CVS:   regular rate and rhythm, no JVP ABD:  soft, non-tender; bowel sounds present; no palpable masses EXT:    baseline ROM, atraumatic, no edema  NEURO:  speech normal, alert and oriented, normal coordination, no facial droop Skin:   warm and dry, no rash, normal skin turgor Psych: Normal affect, appropriate speech and behavior      UC Treatments / Results  Labs (all labs ordered are listed, but only abnormal results are displayed) Labs Reviewed  GLUCOSE, CAPILLARY - Abnormal; Notable for the following components:      Result Value   Glucose-Capillary 140 (*)    All other components within normal limits  CBG MONITORING, ED    EKG   Radiology No results found.  Procedures Procedures (including critical care time)  Medications Ordered in UC Medications - No data to display  Initial Impression / Assessment and Plan / UC Course  I have reviewed the triage vital signs and the nursing notes.  Pertinent labs & imaging results that were available during my care of the patient were reviewed by me and considered in my medical decision making (see chart for details).       Patient is a 87 y.o. female coronary artery disease s/p stent, hypertension, hyperlipidemia, mitral valve prolapse, GERD who presents for shortness of breath, dizziness and central chest pain.  Overall patient is non-ill-appearing and afebrile.  Vital signs stable.  Initial CBG here 140.    On chart review her last Myoview showed left ventricular EF greater than 65% but was  otherwise deemed low risk and was admitted at [facility] in July 2019 for unstable angina.  Given her symptoms and history discussed ED evaluation with patient and she is agreeable.  Updated patient's husband at the bedside.  She declined EMS transport.  Patient's husband will drive her to [facility] for further evaluation.  She is stable for discharge to the ED.  Spoke with [facility] triage RN, Aldona Bar, regarding pt's arrival via private vehicle.  Discussed MDM, treatment plan and plan for follow-up with patient and her husband who agrees with plan.    Final Clinical Impressions(s) / UC Diagnoses   Final diagnoses:  Dizziness  Chest pain, unspecified type     Discharge Instructions      Given your persistent dizziness and chest discomfort we discussed you going to be evaluated in the emergency department.  You have been advised to follow up immediately in the emergency department for concerning signs or symptoms as discussed during your visit. If you declined EMS transport, please have a family member take you directly to the ED at this time. Do not delay.   Based on concerns about condition, if you do not follow up in the ED, you may risk poor outcomes including worsening of condition, delayed treatment and potentially life threatening issues. If you have declined to go to the ED at this time, you should call your PCP immediately to set up a follow up appointment.   Go to ED for red flag symptoms,  including; fevers you cannot reduce with Tylenol/Motrin, severe headaches, vision changes, numbness/weakness in part of the body, lethargy, confusion, intractable vomiting, severe dehydration, chest pain, breathing difficulty, severe persistent abdominal or pelvic pain, signs of severe infection (increased redness, swelling of an area), feeling faint or passing out, dizziness, etc. You should especially go to the ED for sudden acute worsening of condition if you do not elect to go at  this time.       ED Prescriptions   None    PDMP not reviewed this encounter.   Lyndee Hensen, DO 08/27/22 0105

## 2022-08-26 LAB — URINE CULTURE: Culture: <10000 — AB

## 2022-11-28 ENCOUNTER — Other Ambulatory Visit: Payer: Self-pay | Admitting: Family Medicine

## 2022-11-28 DIAGNOSIS — Z1231 Encounter for screening mammogram for malignant neoplasm of breast: Secondary | ICD-10-CM

## 2022-12-17 ENCOUNTER — Emergency Department
Admission: EM | Admit: 2022-12-17 | Discharge: 2022-12-17 | Disposition: A | Discharge status: Home / Self Care | Payer: Medicare HMO | Attending: Emergency Medicine | Admitting: Emergency Medicine

## 2022-12-17 ENCOUNTER — Ambulatory Visit
Admission: EM | Admit: 2022-12-17 | Discharge: 2022-12-17 | Disposition: A | Discharge status: Home / Self Care | Payer: Medicare HMO | Attending: Family Medicine | Admitting: Family Medicine

## 2022-12-17 ENCOUNTER — Emergency Department: Payer: Medicare HMO

## 2022-12-17 ENCOUNTER — Encounter: Payer: Self-pay | Admitting: Emergency Medicine

## 2022-12-17 ENCOUNTER — Other Ambulatory Visit: Payer: Self-pay

## 2022-12-17 DIAGNOSIS — R221 Localized swelling, mass and lump, neck: Secondary | ICD-10-CM | POA: Diagnosis not present

## 2022-12-17 DIAGNOSIS — K1120 Sialoadenitis, unspecified: Secondary | ICD-10-CM | POA: Diagnosis not present

## 2022-12-17 DIAGNOSIS — I10 Essential (primary) hypertension: Secondary | ICD-10-CM | POA: Insufficient documentation

## 2022-12-17 DIAGNOSIS — M542 Cervicalgia: Secondary | ICD-10-CM | POA: Diagnosis present

## 2022-12-17 LAB — CBC WITH DIFFERENTIAL/PLATELET
Abs Immature Granulocytes: 0.04 10*3/uL (ref 0.00–0.07)
Basophils Absolute: 0 10*3/uL (ref 0.0–0.1)
Basophils Relative: 0 %
Eosinophils Absolute: 0.1 10*3/uL (ref 0.0–0.5)
Eosinophils Relative: 1 %
HCT: 37.9 % (ref 36.0–46.0)
Hemoglobin: 12.4 g/dL (ref 12.0–15.0)
Immature Granulocytes: 0 %
Lymphocytes Relative: 19 %
Lymphs Abs: 1.8 10*3/uL (ref 0.7–4.0)
MCH: 31.9 pg (ref 26.0–34.0)
MCHC: 32.7 g/dL (ref 30.0–36.0)
MCV: 97.4 fL (ref 80.0–100.0)
Monocytes Absolute: 0.9 10*3/uL (ref 0.1–1.0)
Monocytes Relative: 9 %
Neutro Abs: 7 10*3/uL (ref 1.7–7.7)
Neutrophils Relative %: 71 %
Platelets: 235 10*3/uL (ref 150–400)
RBC: 3.89 MIL/uL (ref 3.87–5.11)
RDW: 13.2 % (ref 11.5–15.5)
WBC: 9.9 10*3/uL (ref 4.0–10.5)
nRBC: 0 % (ref 0.0–0.2)

## 2022-12-17 LAB — BASIC METABOLIC PANEL
Anion gap: 9 (ref 5–15)
BUN: 25 mg/dL — ABNORMAL HIGH (ref 8–23)
CO2: 26 mmol/L (ref 22–32)
Calcium: 9.3 mg/dL (ref 8.9–10.3)
Chloride: 97 mmol/L — ABNORMAL LOW (ref 98–111)
Creatinine, Ser: 0.97 mg/dL (ref 0.44–1.00)
GFR, Estimated: 57 mL/min — ABNORMAL LOW (ref >60)
Glucose, Bld: 95 mg/dL (ref 70–99)
Potassium: 4.3 mmol/L (ref 3.5–5.1)
Sodium: 132 mmol/L — ABNORMAL LOW (ref 135–145)

## 2022-12-17 MED ORDER — ACETAMINOPHEN 325 MG PO TABS
650.0000 mg | ORAL_TABLET | Freq: Once | ORAL | Status: AC
  Administered 2022-12-17: 650 mg via ORAL
  Filled 2022-12-17: qty 2

## 2022-12-17 MED ORDER — AMOXICILLIN-POT CLAVULANATE 875-125 MG PO TABS: 1.0000 | ORAL_TABLET | Freq: Two times a day (BID) | ORAL | 0 refills | Status: AC

## 2022-12-17 MED ORDER — IOHEXOL 300 MG/ML  SOLN
75.0000 mL | Freq: Once | INTRAMUSCULAR | Status: AC | PRN
  Administered 2022-12-17: 75 mL via INTRAVENOUS

## 2022-12-17 NOTE — Discharge Instructions (Addendum)

## 2022-12-17 NOTE — ED Triage Notes (Signed)
Pt c/o right sided neck swelling and pain. Started this morning. She states her neck had been sore and her right ear has been sore.

## 2022-12-17 NOTE — ED Provider Notes (Signed)
[facility]    CSN: [id number] Arrival date & time: 12/17/22  1110      History   Chief Complaint Chief Complaint  Patient presents with   neck mass    HPI EULLA FARRAJ is a 87 y.o. female.   HPI   Zulie presents for lymph on the side of her neck that she noticed a few days ago. The mass as grown in size and hurts to touch. She has jaw pain and left ear pain.  No dental pain.  Has throbbing sensation in the same ear.      Past Medical History:  Diagnosis Date   Diverticulitis    Hiatal hernia    Hyperlipidemia    Hypertension    Lymphocytic colitis    Mitral valve prolapse    Osteoporosis    Reflux     Patient Active Problem List   Diagnosis Date Noted   Unstable angina (HCC) 12/08/2017   Pelvic fracture (HCC) 11/25/2016    Past Surgical History:  Procedure Laterality Date   APPENDECTOMY     CARDIAC CATHETERIZATION     CATARACT EXTRACTION     CERVICAL DISC SURGERY     CHOLECYSTECTOMY     CORONARY STENT PLACEMENT     FOOT FRACTURE SURGERY     hemangioma     TONSILLECTOMY     TUBAL LIGATION      OB History   No obstetric history on file.      Home Medications    Prior to Admission medications   Medication Sig Start Date End Date Taking? Authorizing Provider  acetaminophen (TYLENOL) 500 MG tablet Take 500 mg by mouth every 6 (six) hours as needed for mild pain or moderate pain.    Yes [provider]  ALPRAZolam (XANAX) 0.25 MG tablet TAKE 1/2 TO 1 TABLET BY MOUTH EVERY 12 HOURS AS NEEDED 07/19/22  Yes [provider]  aspirin EC 81 MG tablet Take 81 mg by mouth daily.   Yes [provider]  atenolol (TENORMIN) 50 MG tablet Take 50 mg by mouth daily. 09/30/16  Yes [provider]  baclofen (LIORESAL) 10 MG tablet Take 1 tablet (10 mg total) by mouth 3 (three) times daily. 01/31/21  Yes Becky Augusta, NP  calcium carbonate (TUMS - DOSED IN MG ELEMENTAL CALCIUM) 500 MG chewable tablet Chew 2 tablets by  mouth daily.   Yes [provider]  carvedilol (COREG) 6.25 MG tablet Take 1 tablet (6.25 mg total) by mouth 2 (two) times daily. 12/09/17  Yes Shaune Pollack, MD  chlorthalidone (HYGROTON) 25 MG tablet Take 12.5 mg by mouth daily. 01/18/21  Yes [provider]  cholecalciferol (VITAMIN D) 1000 units tablet Take 2,000 Units by mouth daily.    Yes [provider]  cholestyramine light (PREVALITE) 4 g packet Take by mouth. 08/23/21  Yes [provider]  citalopram (CELEXA) 20 MG tablet Take 20 mg by mouth daily.   Yes [provider]  clopidogrel (PLAVIX) 75 MG tablet Take 1 tablet by mouth daily. 10/12/15  Yes [provider]  diphenoxylate-atropine (LOMOTIL) 2.5-0.025 MG tablet Take by mouth 4 (four) times daily as needed for diarrhea or loose stools.   Yes [provider]  fluticasone (FLONASE) 50 MCG/ACT nasal spray Place into the nose. 05/26/22 05/26/23 Yes [provider]  isosorbide dinitrate (ISORDIL) 30 MG tablet Take 30 mg by mouth 2 (two) times daily.    Yes [provider]  isosorbide mononitrate (  IMDUR) 30 MG 24 hr tablet Take 30 mg by mouth 2 (two) times daily. 01/20/21  Yes [provider]  labetalol (NORMODYNE) 100 MG tablet Take by mouth. 10/12/15  Yes [provider]  lisinopril (ZESTRIL) 20 MG tablet Take 20 mg by mouth daily.   Yes [provider]  Multiple Vitamins-Minerals (CENTRUM PO) Take 1 tablet by mouth daily.    Yes [provider]  nitroGLYCERIN (NITROSTAT) 0.4 MG SL tablet Place 0.4 mg under the tongue every 5 (five) minutes as needed for chest pain.   Yes [provider]  PROLIA 60 MG/ML SOSY injection Inject into the skin. 01/04/21  Yes [provider]  quinapril (ACCUPRIL) 20 MG tablet Take 20 mg by mouth daily.    Yes [provider]  traMADol (ULTRAM) 50 MG tablet Take 1 tablet (50 mg total) by mouth every 6 (six) hours as needed. 11/16/21   Yes Becky Augusta, NP  potassium chloride (K-DUR) 10 MEQ tablet Take 1 tablet (10 mEq total) by mouth 2 (two) times daily for 3 days. 11/25/18 01/31/21  Domenick Gong, MD  escitalopram (LEXAPRO) 10 MG tablet Take 10 mg by mouth daily.  11/25/18  [provider]    Family History Family History  Problem Relation Age of Onset   Breast cancer Paternal Aunt    Diabetes Mother    Hypertension Mother    Heart attack Mother     Social History Social History   Tobacco Use   Smoking status: Former    Types: Cigarettes   Smokeless tobacco: Never  Vaping Use   Vaping status: Never Used  Substance Use Topics   Alcohol use: No   Drug use: No     Allergies   Codeine, Adhesive [tape], Diazepam, and Ezetimibe   Review of Systems Review of Systems: negative unless otherwise stated in HPI.      Physical Exam Triage Vital Signs ED Triage Vitals  Encounter Vitals Group     BP 12/17/22 1129 123/79     Systolic BP Percentile --      Diastolic BP Percentile --      Pulse Rate 12/17/22 1129 75     Resp 12/17/22 1129 16     Temp 12/17/22 1129 98.8 F (37.1 C)     Temp Source 12/17/22 1129 Oral     SpO2 12/17/22 1129 95 %     Weight 12/17/22 1126 171 lb 15.3 oz (78 kg)     Height 12/17/22 1126 5\' 1"  (1.549 m)     Head Circumference --      Peak Flow --      Pain Score 12/17/22 1126 8     Pain Loc --      Pain Education --      Exclude from Growth Chart --    No data found.  Updated Vital Signs BP 123/79 (BP Location: Left Arm)   Pulse 75   Temp 98.8 F (37.1 C) (Oral)   Resp 16   Ht 5\' 1"  (1.549 m)   Wt 78 kg   SpO2 95%   BMI 32.49 kg/m   Visual Acuity Right Eye Distance:   Left Eye Distance:   Bilateral Distance:    Right Eye Near:   Left Eye Near:    Bilateral Near:     Physical Exam GEN:     alert, well appearing elderly female and no distress    HENT:  mucus membranes moist, oropharyngeal without lesions or erythema,  nares patent, no nasal  discharge, normal TM bilaterally  EYES:   pupils equal and reactive, EOM intact NECK:  supple, good ROM, medium sized non-pulsatile mass that is TTP at the angle of the mandible  Skin:   warm and dry     UC Treatments / Results  Labs (all labs ordered are listed, but only abnormal results are displayed) Labs Reviewed - No data to display  EKG  If EKG performed, see my interpretation in the MDM section  Radiology No results found.   Procedures Procedures (including critical care time)  Medications Ordered in UC Medications - No data to display  Initial Impression / Assessment and Plan / UC Course  I have reviewed the triage vital signs and the nursing notes.  Pertinent labs & imaging results that were available during my care of the patient were reviewed by me and considered in my medical decision making (see chart for details).       Patient is a 87 y.o. female  who presents for neck mass.  Overall patient is nontoxic-appearing and  afebrile.  Vital signs stable.  Satting 95% on room air.  Patient notes she has been experiencing hypotension over the last couple of weeks.  She hears a thumping sensation in her right ear and now has a neck mass that is worsening in size.  Differential diagnosis not limited to vascular aneurysm, lymphadenitis, salivary stone infection versus obstruction, right arm mass.  No floor the mouth swelling concerning for Ludwig angina.  Recommended ED evaluation for CT imaging of her neck as patient is also a former smoker.  Etiology and prognosis uncertain.  Patient to travel via private vehicle to [facility] emergency department.  Spoke with triage RN who will await patient's arrival.  Patient stable for discharge to the emergency department.  ED and return precautions given and patient/guardian voiced understanding. Discussed MDM, treatment plan and plan for follow-up with patient who agrees with plan.    Final Clinical  Impressions(s) / UC Diagnoses   Final diagnoses:  Neck mass     Discharge Instructions      You have been advised to follow up immediately in the emergency department for concerning signs or symptoms as discussed during your visit. If you declined EMS transport, please have a family member take you directly to the ED at this time. Do not delay.   Based on concerns about condition, if you do not follow up in the ED, you may risk poor outcomes including worsening of condition, delayed treatment and potentially life threatening issues. If you have declined to go to the ED at this time, you should call your PCP immediately to set up a follow up appointment.   Go to ED for red flag symptoms, including; fevers you cannot reduce with Tylenol/Motrin, severe headaches, vision changes, numbness/weakness in part of the body, lethargy, confusion, intractable vomiting, severe dehydration, chest pain, breathing difficulty, severe persistent abdominal or pelvic pain, signs of severe infection (increased redness, swelling of an area), feeling faint or passing out, dizziness, etc. You should especially go to the ED for sudden acute worsening of condition if you do not elect to go at this time.       ED Prescriptions   None    PDMP not reviewed this encounter.   Katha Cabal, DO 12/17/22 1219

## 2022-12-17 NOTE — ED Notes (Signed)
Two unsuccessful IV attempts by this Clinical research associate. Will defer to another RN.

## 2022-12-17 NOTE — ED Notes (Addendum)
Patient is being discharged from the Urgent Care and sent to the Emergency Department via POV . Per Dr. Rachael Darby, patient is in need of higher level of care due to neck mass. Patient is aware and verbalizes understanding of plan of care.  Vitals:   12/17/22 1129  BP: 123/79  Pulse: 75  Resp: 16  Temp: 98.8 F (37.1 C)  SpO2: 95%

## 2022-12-17 NOTE — ED Triage Notes (Signed)
Pt to ED  from [facility] for lymphadenopathy to R jaw area since 2 days ago. Tender to touch. Airway appears intact, respirations unlabored. States worse since last night. Also has R ear and R jaw pain. No mass felt on neck. Denies recent illness.  States 1.5 year ago had covid which caused R ear infx which led to hearing loss.

## 2022-12-17 NOTE — Discharge Instructions (Signed)
Apply warm compresses to the swollen area several times per day.  Take sour candies frequently throughout the day to help yourself make a lot of saliva.  This will help draw saliva out of the gland and decrease the swelling.  We have prescribed an antibiotic in case this could be caused by an infection.  However try the above measures first for the next 2 days.  If by Monday or Tuesday you are not seeing any improvement, you can start the antibiotic.  Return to the ER for new, worsening, or persistent severe pain, swelling, shortness of breath, difficulty swallowing, fever, weakness, severe headache, or any other new or worsening symptoms that concern you.

## 2022-12-17 NOTE — ED Provider Notes (Signed)
[facility] Provider Note    Event Date/Time   First MD Initiated Contact with Patient 12/17/22 1329     (approximate)   History   Adenopathy   HPI  Jane [name] is a 87 y.o. female with a history of hypertension, hyperlipidemia, osteoporosis, and GERD who presents with right jaw/upper neck swelling and pain over the last day, acute onset, not associated with any shortness of breath or difficulty swallowing.  The patient states she can hear her own pulse.  She denies any hearing loss.  She denies any trauma to the area.  She has no weakness or numbness.  I reviewed the past medical records.  The patient was seen in urgent care today for the same mass and was recommended to go to the ED for CT imaging.   Physical Exam   Triage Vital Signs: ED Triage Vitals  Encounter Vitals Group     BP 12/17/22 1246 (!) 130/117     Systolic BP Percentile --      Diastolic BP Percentile --      Pulse Rate 12/17/22 1246 71     Resp 12/17/22 1246 18     Temp 12/17/22 1246 97.6 F (36.4 C)     Temp Source 12/17/22 1246 Oral     SpO2 12/17/22 1246 97 %     Weight 12/17/22 1247 171 lb 15.3 oz (78 kg)     Height 12/17/22 1247 5\' 1"  (1.549 m)     Head Circumference --      Peak Flow --      Pain Score 12/17/22 1246 7     Pain Loc --      Pain Education --      Exclude from Growth Chart --     Most recent vital signs: Vitals:   12/17/22 1246  BP: (!) 130/117  Pulse: 71  Resp: 18  Temp: 97.6 F (36.4 C)  SpO2: 97%     General: Awake, no distress.  CV:  Good peripheral perfusion.  Resp:  Normal effort.  Abd:  No distention.  Other:  Right parotid area swelling and tenderness.  No erythema, induration, or abnormal warmth.  Mild right submandibular swelling.  No bruit or thrill.  Normal carotid pulse.  Normal TM and ear canal on the right.  Oropharynx clear with no erythema or exudate.  Normal voice.  No trismus.   ED Results / Procedures / Treatments    Labs (all labs ordered are listed, but only abnormal results are displayed) Labs Reviewed - No data to display   EKG     RADIOLOGY  CT neck soft tissue: I independently viewed and interpreted the images; there is swelling to the right parotid gland.  Radiology impression is as follows:  IMPRESSION:  1. Enlarged and hyperdense right parotid gland compared to the left  without discrete lesions. This is consistent with acute parotitis.  2. No duct obstruction.  3. Remote T3 compression fracture.  4. Multilevel degenerative changes of the cervical spine as  described.  5.  Aortic Atherosclerosis (ICD10-I70.0).     PROCEDURES:  Critical Care performed: No  Procedures   MEDICATIONS ORDERED IN ED: Medications - No data to display   IMPRESSION / MDM / ASSESSMENT AND PLAN / ED COURSE  I reviewed the triage vital signs and the nursing notes.  Differential diagnosis includes, but is not limited to, sialoadenitis parotitis, less likely abscess.  There is no evidence of dental infection.  Although a mention was made of concern for possible vascular etiology in the urgent care note, the patient has no bruit or thrill, the mass is nonpulsatile, and there are no other signs or symptoms of a vascular etiology.  We will obtain basic labs and CT neck soft tissue for further evaluation.  Patient's presentation is most consistent with acute complicated illness / injury requiring diagnostic workup.  ----------------------------------------- 5:19 PM on 12/17/2022 -----------------------------------------  Labs are unremarkable.  There is no leukocytosis and electrolytes are normal.  CT shows findings of peritonitis with no evidence of acute complication.  The patient remains comfortable appearing on exam.  Overall I suspect noninfectious etiology given the lack of any warmth or redness and the lack of fever or leukocytosis.  I have advised the patient on warm compresses and sour  candies.  I have prescribed Augmentin and advised the patient that if the conservative measures do not result in improvement in the next 2 days she should start the antibiotic.  She is in agreement with this plan.  I gave her strict return precautions and she expressed understanding.   FINAL CLINICAL IMPRESSION(S) / ED DIAGNOSES   Final diagnoses:  None     Rx / DC Orders   ED Discharge Orders     None        Note:  This document was prepared using Dragon voice recognition software and may include unintentional dictation errors.    Dionne Bucy, MD 12/17/22 [phone]

## 2023-02-28 ENCOUNTER — Ambulatory Visit
Admission: EM | Admit: 2023-02-28 | Discharge: 2023-02-28 | Disposition: A | Discharge status: Home / Self Care | Payer: Medicare HMO | Attending: Physician Assistant | Admitting: Physician Assistant

## 2023-02-28 ENCOUNTER — Encounter: Payer: Self-pay | Admitting: Emergency Medicine

## 2023-02-28 ENCOUNTER — Ambulatory Visit (INDEPENDENT_AMBULATORY_CARE_PROVIDER_SITE_OTHER): Payer: Medicare HMO

## 2023-02-28 DIAGNOSIS — R062 Wheezing: Secondary | ICD-10-CM | POA: Diagnosis not present

## 2023-02-28 DIAGNOSIS — R0600 Dyspnea, unspecified: Secondary | ICD-10-CM

## 2023-02-28 DIAGNOSIS — Z87891 Personal history of nicotine dependence: Secondary | ICD-10-CM | POA: Insufficient documentation

## 2023-02-28 DIAGNOSIS — J209 Acute bronchitis, unspecified: Secondary | ICD-10-CM

## 2023-02-28 DIAGNOSIS — Z1152 Encounter for screening for COVID-19: Secondary | ICD-10-CM | POA: Insufficient documentation

## 2023-02-28 LAB — SARS CORONAVIRUS 2 BY RT PCR: SARS Coronavirus 2 by RT PCR: NEGATIVE

## 2023-02-28 MED ORDER — PROMETHAZINE-DM 6.25-15 MG/5ML PO SYRP: 5.0000 mL | ORAL_SOLUTION | Freq: Four times a day (QID) | ORAL | 0 refills | Status: DC | PRN

## 2023-02-28 MED ORDER — PREDNISONE 20 MG PO TABS: 40.0000 mg | ORAL_TABLET | Freq: Every day | ORAL | 0 refills | Status: AC

## 2023-02-28 MED ORDER — AMOXICILLIN-POT CLAVULANATE 875-125 MG PO TABS: 1.0000 | ORAL_TABLET | Freq: Two times a day (BID) | ORAL | 0 refills | Status: AC

## 2023-02-28 MED ORDER — ALBUTEROL SULFATE HFA 108 (90 BASE) MCG/ACT IN AERS: 1.0000 | INHALATION_SPRAY | Freq: Four times a day (QID) | RESPIRATORY_TRACT | 0 refills | Status: DC | PRN

## 2023-02-28 MED ORDER — ALBUTEROL SULFATE (2.5 MG/3ML) 0.083% IN NEBU
2.5000 mg | INHALATION_SOLUTION | Freq: Once | RESPIRATORY_TRACT | Status: AC
  Administered 2023-02-28: 2.5 mg via RESPIRATORY_TRACT

## 2023-02-28 NOTE — Discharge Instructions (Addendum)
-  COVID test is negative. -Still waiting on the chest x-ray results.  We will contact you with these results if it shows pneumonia.  I did send antibiotic, corticosteroid, cough medicine and inhaler to the pharmacy.  Start the corticosteroids in the morning.  You can take all the other medications this evening.  Increase rest and fluids. - If you have pneumonia we will add another antibiotic to this. - If symptoms acutely worsen please go to the ER.

## 2023-02-28 NOTE — ED Triage Notes (Signed)
Pt presents with a cough, SOB, and wheezing x 5 days. Her husband was diagnosed with Pneumonia last week. She has tried OTC cough medication with no relief.

## 2023-02-28 NOTE — ED Provider Notes (Signed)
[facility]    CSN: [id number] Arrival date & time: 02/28/23  1655      History   Chief Complaint Chief Complaint  Patient presents with   Cough   Shortness of Breath    HPI Jane [name] is a 87 y.o. female presenting for 5-day history of cough, congestion, wheezing and shortness of breath.  Denies fever, sore throat, nasal congestion, sinus pain, chest pain, abdominal pain, vomiting or diarrhea.  Has been around her husband who was diagnosed with pneumonia last week.  She has been taking over-the-counter cough medicine without relief.  Medical history significant for hypertension, hyperlipidemia, osteoporosis, CAD with previous stent placement, hiatal hernia, GERD, mitral valve prolapse, diverticulitis.  HPI  Past Medical History:  Diagnosis Date   Diverticulitis    Hiatal hernia    Hyperlipidemia    Hypertension    Lymphocytic colitis    Mitral valve prolapse    Osteoporosis    Reflux     Patient Active Problem List   Diagnosis Date Noted   Unstable angina (HCC) 12/08/2017   Pelvic fracture (HCC) 11/25/2016    Past Surgical History:  Procedure Laterality Date   APPENDECTOMY     CARDIAC CATHETERIZATION     CATARACT EXTRACTION     CERVICAL DISC SURGERY     CHOLECYSTECTOMY     CORONARY STENT PLACEMENT     FOOT FRACTURE SURGERY     hemangioma     TONSILLECTOMY     TUBAL LIGATION      OB History   No obstetric history on file.      Home Medications    Prior to Admission medications   Medication Sig Start Date End Date Taking? Authorizing Provider  albuterol (VENTOLIN HFA) 108 (90 Base) MCG/ACT inhaler Inhale 1-2 puffs into the lungs every 6 (six) hours as needed for wheezing or shortness of breath. 02/28/23  Yes Eusebio Friendly B, PA-C  amoxicillin-clavulanate (AUGMENTIN) 875-125 MG tablet Take 1 tablet by mouth every 12 (twelve) hours for 7 days. 02/28/23 03/07/23 Yes Shirlee Latch, PA-C  predniSONE (DELTASONE) 20 MG tablet Take 2 tablets  (40 mg total) by mouth daily for 5 days. 02/28/23 03/05/23 Yes Shirlee Latch, PA-C  promethazine-dextromethorphan (PROMETHAZINE-DM) 6.25-15 MG/5ML syrup Take 5 mLs by mouth 4 (four) times daily as needed. 02/28/23  Yes Shirlee Latch, PA-C  acetaminophen (TYLENOL) 500 MG tablet Take 500 mg by mouth every 6 (six) hours as needed for mild pain or moderate pain.     [provider]  ALPRAZolam (XANAX) 0.25 MG tablet TAKE 1/2 TO 1 TABLET BY MOUTH EVERY 12 HOURS AS NEEDED 07/19/22   [provider]  aspirin EC 81 MG tablet Take 81 mg by mouth daily.    [provider]  atenolol (TENORMIN) 50 MG tablet Take 50 mg by mouth daily. 09/30/16   [provider]  baclofen (LIORESAL) 10 MG tablet Take 1 tablet (10 mg total) by mouth 3 (three) times daily. 01/31/21   Becky Augusta, NP  calcium carbonate (TUMS - DOSED IN MG ELEMENTAL CALCIUM) 500 MG chewable tablet Chew 2 tablets by mouth daily.    [provider]  carvedilol (COREG) 6.25 MG tablet Take 1 tablet (6.25 mg total) by mouth 2 (two) times daily. 12/09/17   Shaune Pollack, MD  chlorthalidone (HYGROTON) 25 MG tablet Take 12.5 mg by mouth daily. 01/18/21   [provider]  cholecalciferol (VITAMIN D) 1000 units tablet Take 2,000 Units by mouth  daily.     [provider]  cholestyramine light (PREVALITE) 4 g packet Take by mouth. 08/23/21   [provider]  citalopram (CELEXA) 20 MG tablet Take 20 mg by mouth daily.    [provider]  clopidogrel (PLAVIX) 75 MG tablet Take 1 tablet by mouth daily. 10/12/15   [provider]  diphenoxylate-atropine (LOMOTIL) 2.5-0.025 MG tablet Take by mouth 4 (four) times daily as needed for diarrhea or loose stools.    [provider]  fluticasone (FLONASE) 50 MCG/ACT nasal spray Place into the nose. 05/26/22 05/26/23  [provider]  isosorbide dinitrate (ISORDIL) 30 MG tablet Take 30 mg by mouth 2 (two) times daily.      [provider]  isosorbide mononitrate (IMDUR) 30 MG 24 hr tablet Take 30 mg by mouth 2 (two) times daily. 01/20/21   [provider]  labetalol (NORMODYNE) 100 MG tablet Take by mouth. 10/12/15   [provider]  lisinopril (ZESTRIL) 20 MG tablet Take 20 mg by mouth daily.    [provider]  Multiple Vitamins-Minerals (CENTRUM PO) Take 1 tablet by mouth daily.     [provider]  nitroGLYCERIN (NITROSTAT) 0.4 MG SL tablet Place 0.4 mg under the tongue every 5 (five) minutes as needed for chest pain.    [provider]  potassium chloride (K-DUR) 10 MEQ tablet Take 1 tablet (10 mEq total) by mouth 2 (two) times daily for 3 days. 11/25/18 01/31/21  Domenick Gong, MD  PROLIA 60 MG/ML SOSY injection Inject into the skin. 01/04/21   [provider]  quinapril (ACCUPRIL) 20 MG tablet Take 20 mg by mouth daily.     [provider]  traMADol (ULTRAM) 50 MG tablet Take 1 tablet (50 mg total) by mouth every 6 (six) hours as needed. 11/16/21   Becky Augusta, NP  escitalopram (LEXAPRO) 10 MG tablet Take 10 mg by mouth daily.  11/25/18  [provider]    Family History Family History  Problem Relation Age of Onset   Breast cancer Paternal Aunt    Diabetes Mother    Hypertension Mother    Heart attack Mother     Social History Social History   Tobacco Use   Smoking status: Former    Types: Cigarettes   Smokeless tobacco: Never  Vaping Use   Vaping status: Never Used  Substance Use Topics   Alcohol use: No   Drug use: No     Allergies   Codeine, Adhesive [tape], Diazepam, and Ezetimibe   Review of Systems Review of Systems  Constitutional:  Positive for fatigue. Negative for chills, diaphoresis and fever.  HENT:  Positive for congestion. Negative for ear pain, rhinorrhea, sinus pressure, sinus pain and sore throat.   Respiratory:  Positive for cough, shortness of breath and wheezing.   Cardiovascular:   Negative for chest pain.  Gastrointestinal:  Negative for abdominal pain, nausea and vomiting.  Musculoskeletal:  Negative for arthralgias and myalgias.  Skin:  Negative for rash.  Neurological:  Negative for weakness and headaches.  Hematological:  Negative for adenopathy.     Physical Exam Triage Vital Signs ED Triage Vitals  Encounter Vitals Group     BP      Systolic BP Percentile      Diastolic BP Percentile      Pulse      Resp      Temp      Temp src      SpO2  Weight      Height      Head Circumference      Peak Flow      Pain Score      Pain Loc      Pain Education      Exclude from Growth Chart    No data found.  Updated Vital Signs BP (!) 148/78 (BP Location: Left Arm)   Pulse 71   Temp 99.1 F (37.3 C)   Resp 18   SpO2 95%      Physical Exam Vitals and nursing note reviewed.  Constitutional:      General: She is not in acute distress.    Appearance: Normal appearance. She is not ill-appearing or toxic-appearing.  HENT:     Head: Normocephalic and atraumatic.     Nose: Congestion and rhinorrhea present.     Mouth/Throat:     Mouth: Mucous membranes are moist.     Pharynx: Oropharynx is clear.  Eyes:     General: No scleral icterus.       Right eye: No discharge.        Left eye: No discharge.     Conjunctiva/sclera: Conjunctivae normal.  Cardiovascular:     Rate and Rhythm: Normal rate and regular rhythm.     Heart sounds: Normal heart sounds.  Pulmonary:     Effort: Pulmonary effort is normal. No respiratory distress.     Breath sounds: Examination of the right-upper field reveals wheezing. Examination of the left-upper field reveals wheezing. Examination of the right-middle field reveals wheezing. Examination of the left-middle field reveals wheezing. Examination of the right-lower field reveals wheezing. Examination of the left-lower field reveals wheezing. Wheezing present. No rhonchi or rales.  Musculoskeletal:     Cervical back:  Neck supple.  Skin:    General: Skin is dry.  Neurological:     General: No focal deficit present.     Mental Status: She is alert. Mental status is at baseline.     Motor: No weakness.     Gait: Gait normal.  Psychiatric:        Mood and Affect: Mood normal.        Behavior: Behavior normal.        Thought Content: Thought content normal.      UC Treatments / Results  Labs (all labs ordered are listed, but only abnormal results are displayed) Labs Reviewed  SARS CORONAVIRUS 2 BY RT PCR    EKG   Radiology DG Chest 2 View  Result Date: 02/28/2023 CLINICAL DATA:  Cough and wheezing for 5 days EXAM: CHEST - 2 VIEW COMPARISON:  08/24/2022 FINDINGS: No acute airspace disease or effusion. Stable cardiomediastinal silhouette with aortic atherosclerosis. Chronic lower thoracic compression deformity. IMPRESSION: No active cardiopulmonary disease. Electronically Signed   By: Jasmine Pang M.D.   On: 02/28/2023 19:30    Procedures Procedures (including critical care time)  Medications Ordered in UC Medications  albuterol (PROVENTIL) (2.5 MG/3ML) 0.083% nebulizer solution 2.5 mg (2.5 mg Nebulization Given 02/28/23 1904)    Initial Impression / Assessment and Plan / UC Course  I have reviewed the triage vital signs and the nursing notes.  Pertinent labs & imaging results that were available during my care of the patient were reviewed by me and considered in my medical decision making (see chart for details).   87 year old female presents for 5-day history of cough, congestion, wheezing, shortness of breath.  Has been around her husband who was recently diagnosed  with pneumonia.  She is afebrile and overall well-appearing.  On exam has nasal congestion.  Chest with diffuse wheezing throughout all lung fields.   Patient given albuterol nebulizer treatment.  I believe  Chest x-ray obtained as well as COVID testing given complaints of cough and shortness of breath.  He did COVID test.   Chest x-ray compared to one from earlier this year without any significant abnormalities.  Reviewed results with patient.  Pending official overread from x-ray..  Acute bacterial bronchitis.  Sent albuterol, prednisone, Augmentin and Promethazine DM.  She was prescribed benzonatate by her PCP was not comfortable with the side effects and declines to take it.  Has codeine allergy and Mucinex was not helping.  We discussed that this medication may make her a little dizzy so she should be careful when taking it.  Supportive care advised.  Reviewed return and ER precautions.  Chest x-ray within normal limits.  Final Clinical Impressions(s) / UC Diagnoses   Final diagnoses:  Acute bronchitis, unspecified organism  Dyspnea, unspecified type  Wheezing     Discharge Instructions      -COVID test is negative. -Still waiting on the chest x-ray results.  We will contact you with these results if it shows pneumonia.  I did send antibiotic, corticosteroid, cough medicine and inhaler to the pharmacy.  Start the corticosteroids in the morning.  You can take all the other medications this evening.  Increase rest and fluids. - If you have pneumonia we will add another antibiotic to this. - If symptoms acutely worsen please go to the ER.     ED Prescriptions     Medication Sig Dispense Auth. Provider   predniSONE (DELTASONE) 20 MG tablet Take 2 tablets (40 mg total) by mouth daily for 5 days. 10 tablet Shirlee Latch, PA-C   promethazine-dextromethorphan (PROMETHAZINE-DM) 6.25-15 MG/5ML syrup Take 5 mLs by mouth 4 (four) times daily as needed. 118 mL Eusebio Friendly B, PA-C   amoxicillin-clavulanate (AUGMENTIN) 875-125 MG tablet Take 1 tablet by mouth every 12 (twelve) hours for 7 days. 14 tablet Eusebio Friendly B, PA-C   albuterol (VENTOLIN HFA) 108 (90 Base) MCG/ACT inhaler Inhale 1-2 puffs into the lungs every 6 (six) hours as needed for wheezing or shortness of breath. 1 g Shirlee Latch, PA-C       PDMP not reviewed this encounter.   Shirlee Latch, PA-C 02/28/23 1939

## 2023-06-05 ENCOUNTER — Ambulatory Visit
Admission: RE | Admit: 2023-06-05 | Discharge: 2023-06-05 | Disposition: A | Discharge status: Home / Self Care | Payer: Medicare HMO | Source: Ambulatory Visit | Attending: Family Medicine | Admitting: Family Medicine

## 2023-06-05 DIAGNOSIS — Z1231 Encounter for screening mammogram for malignant neoplasm of breast: Secondary | ICD-10-CM | POA: Insufficient documentation

## 2023-07-08 ENCOUNTER — Other Ambulatory Visit: Payer: Self-pay

## 2023-07-08 ENCOUNTER — Emergency Department: Payer: Medicare HMO

## 2023-07-08 ENCOUNTER — Emergency Department
Admission: EM | Admit: 2023-07-08 | Discharge: 2023-07-08 | Disposition: A | Discharge status: Home / Self Care | Payer: Medicare HMO | Attending: Student in an Organized Health Care Education/Training Program | Admitting: Student in an Organized Health Care Education/Training Program

## 2023-07-08 DIAGNOSIS — I10 Essential (primary) hypertension: Secondary | ICD-10-CM | POA: Diagnosis present

## 2023-07-08 DIAGNOSIS — M4802 Spinal stenosis, cervical region: Secondary | ICD-10-CM | POA: Diagnosis not present

## 2023-07-08 DIAGNOSIS — R531 Weakness: Secondary | ICD-10-CM | POA: Insufficient documentation

## 2023-07-08 DIAGNOSIS — I159 Secondary hypertension, unspecified: Secondary | ICD-10-CM | POA: Insufficient documentation

## 2023-07-08 DIAGNOSIS — Z20822 Contact with and (suspected) exposure to covid-19: Secondary | ICD-10-CM | POA: Diagnosis not present

## 2023-07-08 LAB — URINALYSIS, ROUTINE W REFLEX MICROSCOPIC
Bilirubin Urine: NEGATIVE
Glucose, UA: NEGATIVE mg/dL
Hgb urine dipstick: NEGATIVE
Ketones, ur: NEGATIVE mg/dL
Nitrite: NEGATIVE
Protein, ur: NEGATIVE mg/dL
Specific Gravity, Urine: 1.012 (ref 1.005–1.030)
pH: 6 (ref 5.0–8.0)

## 2023-07-08 LAB — CBC WITH DIFFERENTIAL/PLATELET
Abs Immature Granulocytes: 0.02 10*3/uL (ref 0.00–0.07)
Basophils Absolute: 0 10*3/uL (ref 0.0–0.1)
Basophils Relative: 1 %
Eosinophils Absolute: 0.1 10*3/uL (ref 0.0–0.5)
Eosinophils Relative: 1 %
HCT: 37.3 % (ref 36.0–46.0)
Hemoglobin: 12.3 g/dL (ref 12.0–15.0)
Immature Granulocytes: 0 %
Lymphocytes Relative: 17 %
Lymphs Abs: 1.3 10*3/uL (ref 0.7–4.0)
MCH: 32.4 pg (ref 26.0–34.0)
MCHC: 33 g/dL (ref 30.0–36.0)
MCV: 98.2 fL (ref 80.0–100.0)
Monocytes Absolute: 0.5 10*3/uL (ref 0.1–1.0)
Monocytes Relative: 7 %
Neutro Abs: 5.5 10*3/uL (ref 1.7–7.7)
Neutrophils Relative %: 74 %
Platelets: 262 10*3/uL (ref 150–400)
RBC: 3.8 MIL/uL — ABNORMAL LOW (ref 3.87–5.11)
RDW: 13.9 % (ref 11.5–15.5)
WBC: 7.4 10*3/uL (ref 4.0–10.5)
nRBC: 0 % (ref 0.0–0.2)

## 2023-07-08 LAB — RESP PANEL BY RT-PCR (RSV, FLU A&B, COVID)  RVPGX2
Influenza A by PCR: NEGATIVE
Influenza B by PCR: NEGATIVE
Resp Syncytial Virus by PCR: NEGATIVE
SARS Coronavirus 2 by RT PCR: NEGATIVE

## 2023-07-08 LAB — BASIC METABOLIC PANEL
Anion gap: 9 (ref 5–15)
BUN: 15 mg/dL (ref 8–23)
CO2: 28 mmol/L (ref 22–32)
Calcium: 9.7 mg/dL (ref 8.9–10.3)
Chloride: 101 mmol/L (ref 98–111)
Creatinine, Ser: 0.96 mg/dL (ref 0.44–1.00)
GFR, Estimated: 58 mL/min — ABNORMAL LOW (ref >60)
Glucose, Bld: 125 mg/dL — ABNORMAL HIGH (ref 70–99)
Potassium: 3.9 mmol/L (ref 3.5–5.1)
Sodium: 138 mmol/L (ref 135–145)

## 2023-07-08 LAB — TROPONIN I (HIGH SENSITIVITY)
Troponin I (High Sensitivity): 13 ng/L (ref <18)
Troponin I (High Sensitivity): 12 ng/L (ref <18)

## 2023-07-08 NOTE — ED Triage Notes (Signed)
Pt to ED via POV from home. Pt reports BP has been running high SBP >170s. Pt reports this morning woke up feeling confused and HA. Pt also reports chest pressure. Pt also reports this morning when she would pick up something in her right arm she would drop it. Pt is on blood thinner. Pt reports feeling off last pm.

## 2023-07-08 NOTE — ED Provider Notes (Signed)
[facility] Provider Note    Event Date/Time   First MD Initiated Contact with Patient 07/08/23 1636     (approximate)   History   Hypertension   HPI  Jane [name] is a 88 y.o. female presents the ER for evaluation of generalized weakness starting yesterday morning did not feel like herself this morning woke up with confusion as well as right upper extremity weakness.  Feels like anything she is reaching for and grabbing she is dropping which is not normal for her.  Denies any history of stroke she is on Plavix does have a history of atherosclerotic cardiovascular disease.  She does not smoke.     Physical Exam   Triage Vital Signs: ED Triage Vitals  Encounter Vitals Group     BP 07/08/23 1400 (!) 163/70     Systolic BP Percentile --      Diastolic BP Percentile --      Pulse Rate 07/08/23 1400 65     Resp 07/08/23 1400 20     Temp 07/08/23 1400 98 F (36.7 C)     Temp Source 07/08/23 1400 Oral     SpO2 07/08/23 1400 100 %     Weight --      Height --      Head Circumference --      Peak Flow --      Pain Score 07/08/23 1401 5     Pain Loc --      Pain Education --      Exclude from Growth Chart --     Most recent vital signs: Vitals:   07/08/23 1800 07/08/23 1840  BP: (!) 165/68   Pulse: 62   Resp: (!) 21   Temp:  98.3 F (36.8 C)  SpO2: 100%      Constitutional: Alert  Eyes: Conjunctivae are normal.  Head: Atraumatic. Nose: No congestion/rhinnorhea. Mouth/Throat: Mucous membranes are moist.   Neck: Painless ROM.  Cardiovascular:   Good peripheral circulation. Respiratory: Normal respiratory effort.  No retractions.  Gastrointestinal: Soft and nontender.  Musculoskeletal:  no deformity Neurologic:  MAE spontaneously. Good strength throughout, no dysmetria, normal fnf, silt, CNI Skin:  Skin is warm, dry and intact. No rash noted. Psychiatric: Mood and affect are normal. Speech and behavior are normal.    ED Results /  Procedures / Treatments   Labs (all labs ordered are listed, but only abnormal results are displayed) Labs Reviewed  BASIC METABOLIC PANEL - Abnormal; Notable for the following components:      Result Value   Glucose, Bld 125 (*)    GFR, Estimated 58 (*)    All other components within normal limits  CBC WITH DIFFERENTIAL/PLATELET - Abnormal; Notable for the following components:   RBC 3.80 (*)    All other components within normal limits  URINALYSIS, ROUTINE W REFLEX MICROSCOPIC - Abnormal; Notable for the following components:   Color, Urine YELLOW (*)    APPearance CLEAR (*)    Leukocytes,Ua TRACE (*)    Bacteria, UA RARE (*)    All other components within normal limits  RESP PANEL BY RT-PCR (RSV, FLU A&B, COVID)  [id number]  TROPONIN I (HIGH SENSITIVITY)  TROPONIN I (HIGH SENSITIVITY)     EKG  ED ECG REPORT I, Willy Eddy, the attending physician, personally viewed and interpreted this ECG.   Date: 07/08/2023  EKG Time: 14:11  Rate: 70  Rhythm: sinus  Axis: normal  Intervals: normal  ST&T Change:  no stemi, no depressions    RADIOLOGY Please see ED Course for my review and interpretation.  I personally reviewed all radiographic images ordered to evaluate for the above acute complaints and reviewed radiology reports and findings.  These findings were personally discussed with the patient.  Please see medical record for radiology report.    PROCEDURES:  Critical Care performed: no  Procedures   MEDICATIONS ORDERED IN ED: Medications - No data to display   IMPRESSION / MDM / ASSESSMENT AND PLAN / ED COURSE  I reviewed the triage vital signs and the nursing notes.                              Differential diagnosis includes, but is not limited to, cva, tia, hypoglycemia, dehydration, electrolyte abnormality, dissection, sepsis  Patient presenting to the ER for evaluation of symptoms as described above.  Based on symptoms, risk factors and considered  above differential, this presenting complaint could reflect a potentially life-threatening illness therefore the patient will be placed on continuous pulse oximetry and telemetry for monitoring.  Laboratory evaluation will be sent to evaluate for the above complaints.      Clinical Course as of 07/08/23 2039  Sat Jul 08, 2023  2036 Patient reassessed.  She remains well-appearing in no acute distress.  No findings to suggest CVA on MRI.  Does have significant diffuse foraminal stenosis and mild to moderate central canal stenosis on MRI but no sign of myelopathy.  She does appear appropriate for outpatient referral and follow-up.  Patient agreeable with plan. [PR]    Clinical Course User Index [PR] Willy Eddy, MD     FINAL CLINICAL IMPRESSION(S) / ED DIAGNOSES   Final diagnoses:  Secondary hypertension  Weakness  Neural foraminal stenosis of cervical spine     Rx / DC Orders   ED Discharge Orders     None        Note:  This document was prepared using Dragon voice recognition software and may include unintentional dictation errors.    Willy Eddy, MD 07/08/23 2039

## 2023-07-08 NOTE — ED Provider Triage Note (Signed)
Emergency Medicine Provider Triage Evaluation Note  Jane [name] , a 88 y.o. female  was evaluated in triage.  Pt complains of high blood pressure, headache, chest pressure, and reports that she feels confused.  She reports that she felt this way for the past 2 days.  She reports that she is on blood thinners.  Denies any difficulty walking or speaking.  Reports that she feels full body weakness.  Review of Systems  Positive: Confusion, weak, headache, chest tightness Negative: Shortness of breath  Physical Exam  BP (!) 163/70   Pulse 65   Temp 98 F (36.7 C) (Oral)   Resp 20   SpO2 100%  Gen:   Awake, no distress   Resp:  Normal effort  MSK:   Moves extremities without difficulty  Other:    Medical Decision Making  Medically screening exam initiated at 2:03 PM.  Appropriate orders placed.  [name] was informed that the remainder of the evaluation will be completed by another provider, this initial triage assessment does not replace that evaluation, and the importance of remaining in the ED until their evaluation is complete.     Jackelyn Hoehn, PA-C 07/08/23 1404

## 2023-07-08 NOTE — ED Notes (Signed)
Pt ambulated to bedside toilet with steady gate and returned to bed without incident. NAD, CB within reach

## 2023-07-20 NOTE — Progress Notes (Signed)
Referring Physician:  Willy Eddy, MD [address] [place],  [place] [address]  Primary Physician:  Dorothey Baseman, MD  History of Present Illness: 07/24/2023 Jane [name] is here today to follow-up for neck and arm pain that she was experiencing approximately 2 weeks ago.  Previous cervical fusion.  She is having severe pain that radiated down her arm with associated numbness and tingling and weakness.  She states this is now completely resolved.  She denies any neck pain, current numbness weakness or tingling.  She denies any changes to her bowel and bladder.  She denies any issues with her gait.  She is not currently in any pain at all.  Severity: 0/10  Precipitating: aggravated by nothing Modifying factors: made better by nothing Weakness: none  Bowel/Bladder Dysfunction: none  Conservative measures:  Physical therapy: has not participated in Multimodal medical therapy including regular antiinflammatories: tylenol Injections: no epidural steroid injections  Past Surgery:  Neck Surgery by Dr. Bevelyn Buckles Digangi has no symptoms of cervical myelopathy.  The symptoms are causing a significant impact on the patient's life.   Review of Systems:  A 10 point review of systems is negative, except for the pertinent positives and negatives detailed in the HPI.  Past Medical History: Past Medical History:  Diagnosis Date   Diverticulitis    Hiatal hernia    Hyperlipidemia    Hypertension    Lymphocytic colitis    Mitral valve prolapse    Osteoporosis    Reflux     Past Surgical History: Past Surgical History:  Procedure Laterality Date   APPENDECTOMY     CARDIAC CATHETERIZATION     CATARACT EXTRACTION     CERVICAL DISC SURGERY     CHOLECYSTECTOMY     CORONARY STENT PLACEMENT     FOOT FRACTURE SURGERY     hemangioma     TONSILLECTOMY     TUBAL LIGATION      Allergies: Allergies as of 07/24/2023 - Review Complete 07/24/2023  Allergen  Reaction Noted   Codeine Anaphylaxis and Other (See Comments) 04/03/2013   Adhesive [tape]  04/03/2013   Diazepam Other (See Comments) 11/13/2013   Ezetimibe Other (See Comments) 11/13/2013    Medications: Outpatient Encounter Medications as of 07/24/2023  Medication Sig   acetaminophen (TYLENOL) 500 MG tablet Take 500 mg by mouth every 6 (six) hours as needed for mild pain or moderate pain.    ALPRAZolam (XANAX) 0.25 MG tablet TAKE 1/2 TO 1 TABLET BY MOUTH EVERY 12 HOURS AS NEEDED   aspirin EC 81 MG tablet Take 81 mg by mouth daily.   atenolol (TENORMIN) 50 MG tablet Take 50 mg by mouth daily.   baclofen (LIORESAL) 10 MG tablet Take 1 tablet (10 mg total) by mouth 3 (three) times daily.   calcium carbonate (TUMS - DOSED IN MG ELEMENTAL CALCIUM) 500 MG chewable tablet Chew 2 tablets by mouth daily.   carvedilol (COREG) 6.25 MG tablet Take 1 tablet (6.25 mg total) by mouth 2 (two) times daily.   chlorthalidone (HYGROTON) 25 MG tablet Take 12.5 mg by mouth daily.   cholecalciferol (VITAMIN D) 1000 units tablet Take 2,000 Units by mouth daily.    cholestyramine light (PREVALITE) 4 g packet Take by mouth.   citalopram (CELEXA) 20 MG tablet Take 20 mg by mouth daily.   clopidogrel (PLAVIX) 75 MG tablet Take 1 tablet by mouth daily.   diphenoxylate-atropine (LOMOTIL) 2.5-0.025 MG tablet Take by mouth 4 (four)  times daily as needed for diarrhea or loose stools.   fluticasone (FLONASE) 50 MCG/ACT nasal spray Place into the nose.   isosorbide dinitrate (ISORDIL) 30 MG tablet Take 30 mg by mouth 2 (two) times daily.    isosorbide mononitrate (IMDUR) 30 MG 24 hr tablet Take 30 mg by mouth 2 (two) times daily.   labetalol (NORMODYNE) 100 MG tablet Take by mouth.   lisinopril (ZESTRIL) 20 MG tablet Take 20 mg by mouth daily.   Multiple Vitamins-Minerals (CENTRUM PO) Take 1 tablet by mouth daily.    nitroGLYCERIN (NITROSTAT) 0.4 MG SL tablet Place 0.4 mg under the tongue every 5 (five) minutes as  needed for chest pain.   PROLIA 60 MG/ML SOSY injection Inject into the skin.   quinapril (ACCUPRIL) 20 MG tablet Take 20 mg by mouth daily.    traMADol (ULTRAM) 50 MG tablet Take 1 tablet (50 mg total) by mouth every 6 (six) hours as needed.   potassium chloride (K-DUR) 10 MEQ tablet Take 1 tablet (10 mEq total) by mouth 2 (two) times daily for 3 days.   [DISCONTINUED] albuterol (VENTOLIN HFA) 108 (90 Base) MCG/ACT inhaler Inhale 1-2 puffs into the lungs every 6 (six) hours as needed for wheezing or shortness of breath.   [DISCONTINUED] escitalopram (LEXAPRO) 10 MG tablet Take 10 mg by mouth daily.   [DISCONTINUED] promethazine-dextromethorphan (PROMETHAZINE-DM) 6.25-15 MG/5ML syrup Take 5 mLs by mouth 4 (four) times daily as needed.   No facility-administered encounter medications on file as of 07/24/2023.    Social History: Social History   Tobacco Use   Smoking status: Former    Types: Cigarettes   Smokeless tobacco: Never  Vaping Use   Vaping status: Never Used  Substance Use Topics   Alcohol use: No   Drug use: No    Family Medical History: Family History  Problem Relation Age of Onset   Breast cancer Paternal Aunt    Diabetes Mother    Hypertension Mother    Heart attack Mother     Physical Examination: @VITALWITHPAIN @  General: Patient is well developed, well nourished, calm, collected, and in no apparent distress. Attention to examination is appropriate.  Psychiatric: Patient is non-anxious.  Head:  Pupils equal, round, and reactive to light.  ENT:  Oral mucosa appears well hydrated.  Neck:   Supple.  Respiratory: Patient is breathing without any difficulty.  Extremities: No edema.  Vascular: Palpable dorsal pedal pulses.  Skin:   On exposed skin, there are no abnormal skin lesions.  NEUROLOGICAL:     Awake, alert, oriented to person, place, and time.  Speech is clear and fluent. Fund of knowledge is appropriate.   Cranial Nerves: Pupils equal round  and reactive to light.  Facial tone is symmetric.  Facial sensation is symmetric.  ROM of spine: Patient has some trouble with extension of her cervical spine.  However there is no pain or limitations with flexion or turning from side-to-side.  Strength: Side Biceps Triceps Deltoid Interossei Grip Wrist Ext. Wrist Flex.  R 5 5 5 5 5 5 5   L 5 5 5 5 5 5 5     Reflexes are 2+ and symmetric at the biceps, triceps, brachioradialis, Hoffman's is absent.  Bilateral upper  extremity sensation is intact to light touch.    Gait is normal.   No difficulty with tandem gait.   No evidence of dysmetria noted.  Medical Decision Making  Imaging: MRI Cervical spine/head (07/08/23):  MRI CERVICAL SPINE FINDINGS  Alignment: No substantial sagittal subluxation.   Vertebrae: No evidence of acute fracture. No marrow edema. Remote T3 severe compression fracture with approximately 80% height loss and 3 mm of bony retropulsion.   Cord: Normal cord signal.   Posterior Fossa, vertebral arteries, paraspinal tissues: Visualized vertebral artery flow voids are maintained. No paraspinal edema.   Disc levels:   C2-C3: Likely on a facet uncovertebral hypertrophy contributes to mild left foraminal stenosis. Patent canal.   C3-C4: Posterior disc osteophyte complex with bilateral facet uncovertebral hypertrophy. Resulting severe bilateral foraminal stenosis. Mild canal stenosis.   C4-C5: Posterior disc osteophyte complex with left greater than right facet and uncovertebral hypertrophy. Resulting severe bilateral foraminal stenosis and mild-to-moderate canal stenosis.   C5-C6: Ankylosis across the disc space.  No significant stenosis.   C6-C7: Posterior disc osteophyte complex with bilateral facet and uncovertebral hypertrophy. Resulting moderate to severe bilateral foraminal stenosis and mild to moderate canal stenosis.   C7-T1: Left greater than right facet and uncovertebral hypertrophy. Resulting  moderate left and mild right foraminal stenosis. Mild canal stenosis.   IMPRESSION: MRI HEAD:   No evidence of acute intracranial abnormality.   MRI CERVICAL SPINE:   1. At C3-C4, severe bilateral foraminal stenosis and mild canal stenosis. 2. At C4-C5, severe bilateral foraminal stenosis and mild-to-moderate canal stenosis. 3. At C6-C7, moderate to severe bilateral foraminal stenosis and mild-to-moderate canal stenosis. 4. At C7-T1, moderate left foraminal stenosis and mild canal stenosis. 5. Remote T3 severe compression fracture with approximately 80% height loss and 3 mm of bony retropulsion.      I have personally reviewed the images and agree with the above interpretation.  Assessment and Plan: Jane [name] is a pleasant 88 y.o. female is here today to follow-up for neck and arm pain that she was experiencing approximately 2 weeks ago.  Previous cervical fusion.  She is having severe pain that radiated down her arm with associated numbness and tingling and weakness.  She states this is now completely resolved.  She denies any neck pain, current numbness weakness or tingling.  She denies any changes to her bowel and bladder.  She denies any issues with her gait.  She is not currently in any pain at all.  On examination she has full strength.  She does have a little bit of difficulty with full range of motion of her cervical spine.  Her MRI was reviewed and is detailed above.  Is pleasure to see patient in clinic today.  I am very happy that her symptoms from what was felt to be a potential cervical radiculopathy have resolved.  We did discuss her symptoms and red flag symptoms she should be on the look out for.  She is encouraged to reach out to me if anything were to change with her symptoms.      Thank you for involving me in the care of this patient.   Joan Flores, PA-C Dept. of Neurosurgery

## 2023-07-24 ENCOUNTER — Ambulatory Visit
Admission: RE | Admit: 2023-07-24 | Discharge: 2023-07-24 | Disposition: A | Discharge status: Home / Self Care | Payer: Medicare HMO | Source: Ambulatory Visit | Attending: Physician Assistant | Admitting: *Deleted

## 2023-07-24 ENCOUNTER — Other Ambulatory Visit: Payer: Self-pay

## 2023-07-24 ENCOUNTER — Ambulatory Visit (INDEPENDENT_AMBULATORY_CARE_PROVIDER_SITE_OTHER): Payer: Medicare HMO | Admitting: Physician Assistant

## 2023-07-24 ENCOUNTER — Ambulatory Visit
Admission: RE | Admit: 2023-07-24 | Discharge: 2023-07-24 | Disposition: A | Discharge status: Home / Self Care | Payer: Medicare HMO | Source: Ambulatory Visit | Attending: Physician Assistant | Admitting: Physician Assistant

## 2023-07-24 VITALS — BP 124/74 | Ht 61.0 in | Wt 164.0 lb

## 2023-07-24 DIAGNOSIS — M5010 Cervical disc disorder with radiculopathy, unspecified cervical region: Secondary | ICD-10-CM

## 2023-07-24 DIAGNOSIS — M4802 Spinal stenosis, cervical region: Secondary | ICD-10-CM

## 2023-07-24 DIAGNOSIS — R9389 Abnormal findings on diagnostic imaging of other specified body structures: Secondary | ICD-10-CM

## 2023-08-14 IMAGING — MG MM DIGITAL SCREENING BILAT W/ TOMO AND CAD
8 series · 8 of 24 positions shown · non-contrast
Comparison: Previous exam(s).

CLINICAL DATA: Screening.

EXAM:
DIGITAL SCREENING BILATERAL MAMMOGRAM WITH TOMOSYNTHESIS AND CAD
TECHNIQUE: Bilateral screening digital craniocaudal and mediolateral oblique
mammograms were obtained. Bilateral screening digital breast
tomosynthesis was performed. The images were evaluated with
computer-aided detection.

[L CC synth-2D]
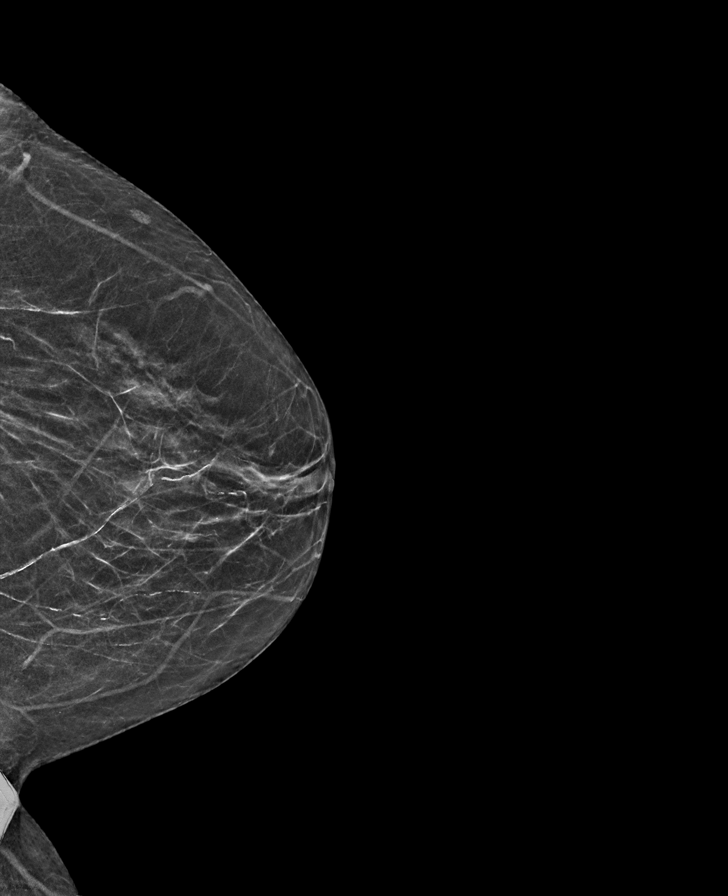

[R CC synth-2D]
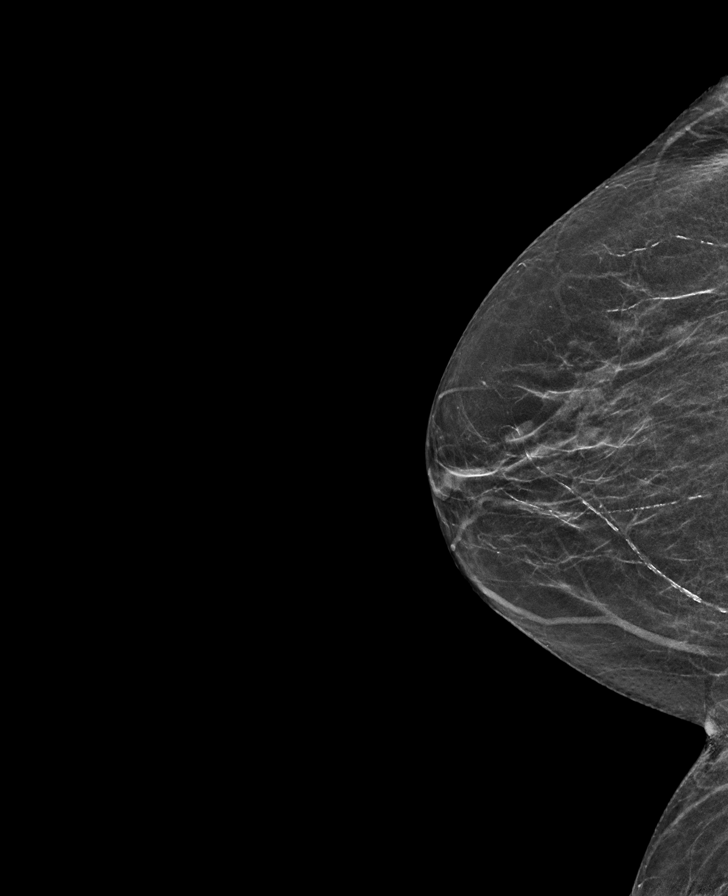

[R MLO synth-2D]
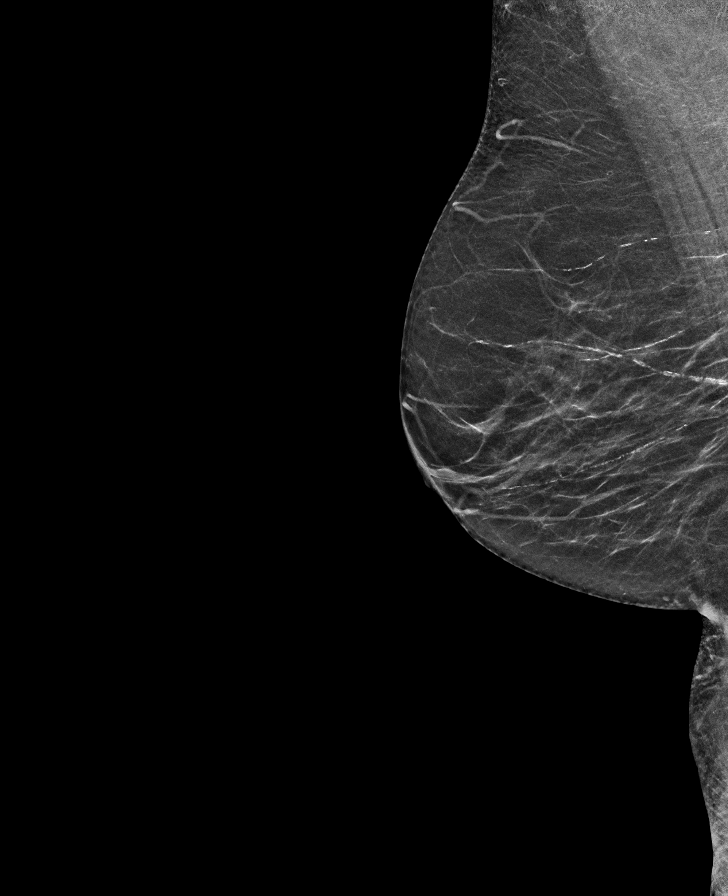

[L MLO synth-2D]
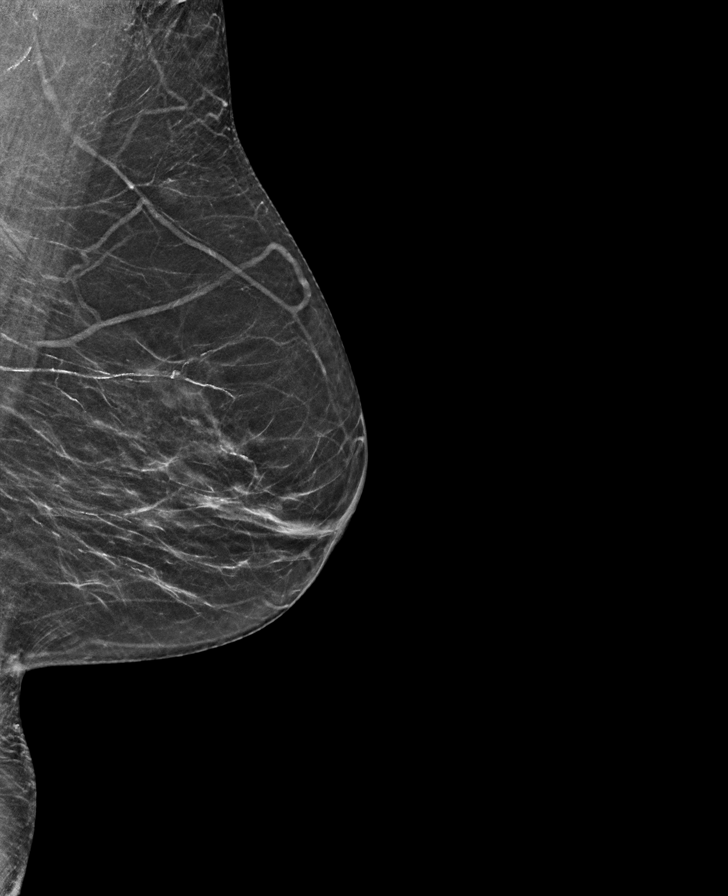

[L MLO tomo · tomo slice 26/51.0]
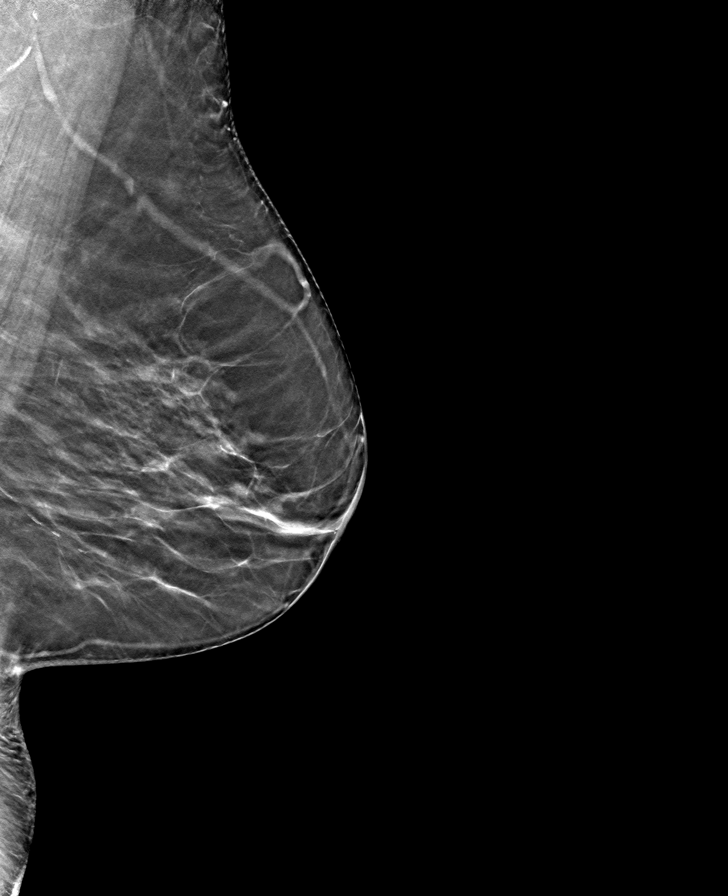

[L CC tomo · tomo slice 23/44.0]
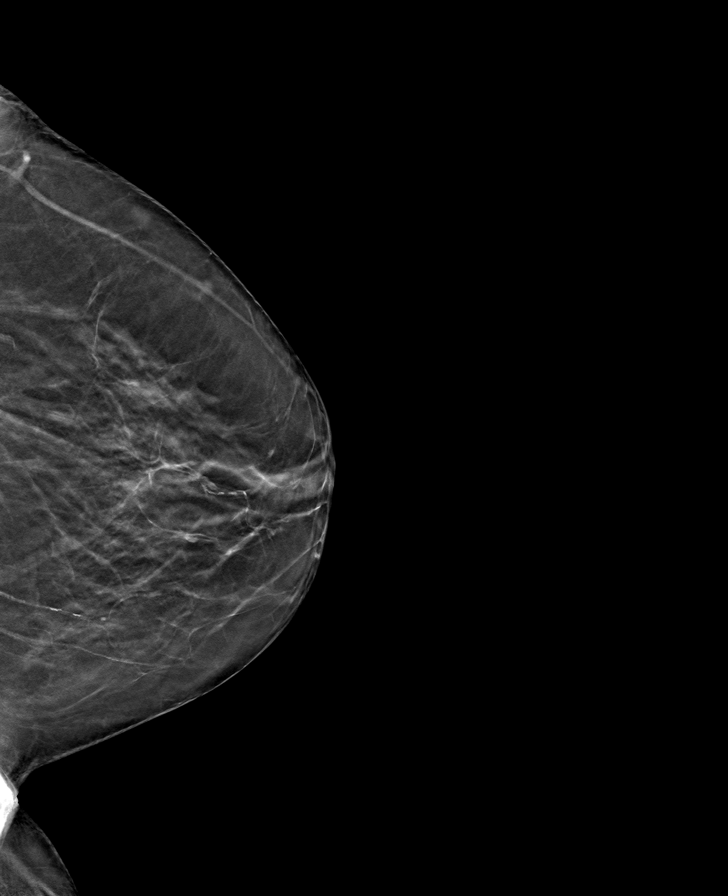

[R MLO tomo · tomo slice 23/45.0]
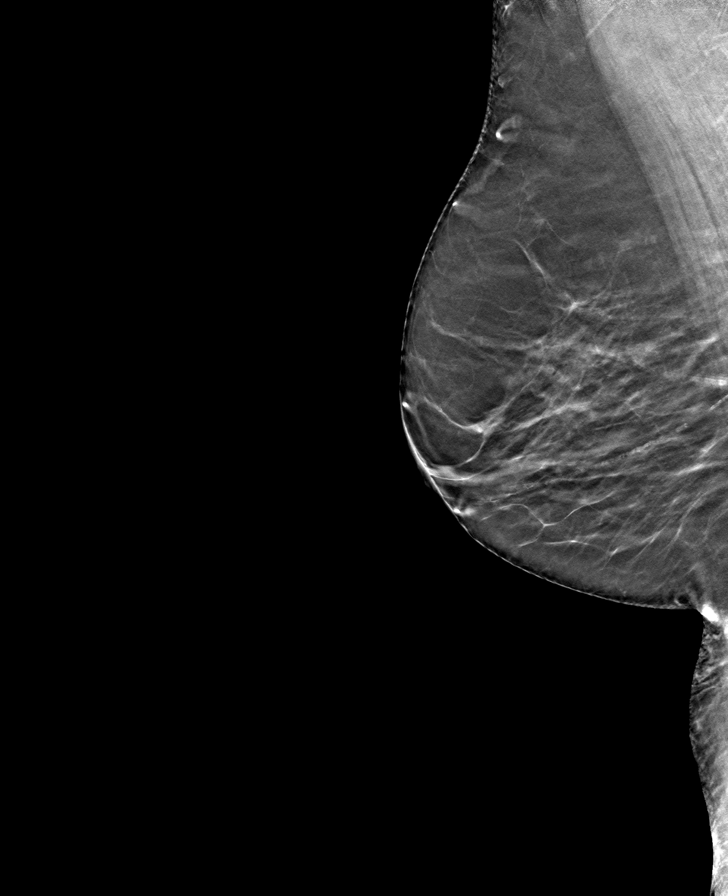

[R CC tomo · tomo slice 22/43.0]
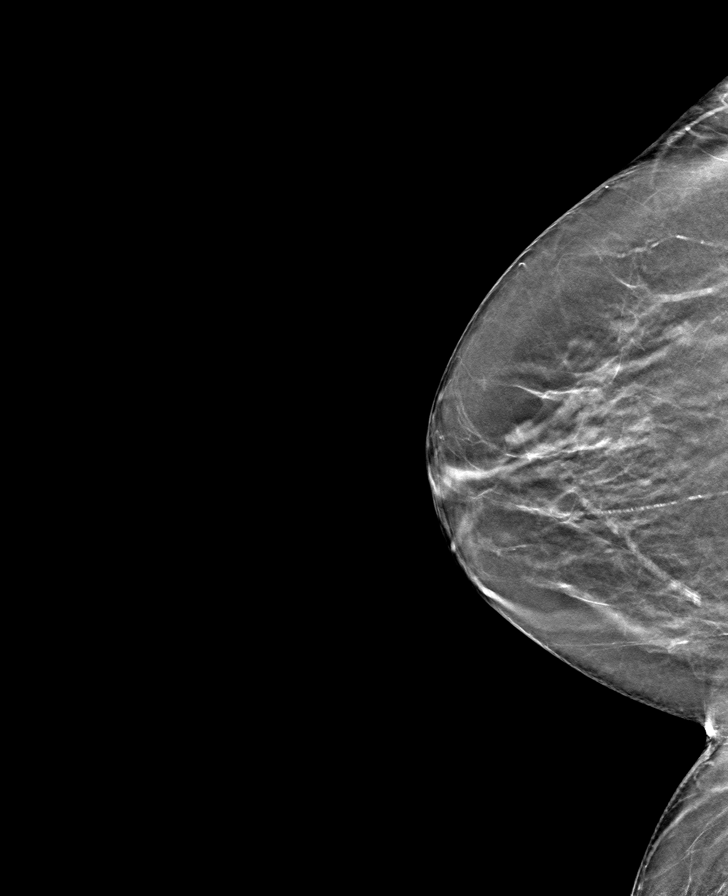

[8 of 24 positions shown; findings below may reference images not displayed]

ACR Breast Density Category b: There are scattered areas of
fibroglandular density.
FINDINGS: There are no findings suspicious for malignancy.
IMPRESSION: No mammographic evidence of malignancy. A result letter of this
screening mammogram will be mailed directly to the patient.

RECOMMENDATION:
Screening mammogram in one year. (Code:51-O-LD2)

BI-RADS CATEGORY  1: Negative.

## 2023-09-30 ENCOUNTER — Encounter: Payer: Self-pay | Admitting: Emergency Medicine

## 2023-09-30 ENCOUNTER — Emergency Department

## 2023-09-30 ENCOUNTER — Other Ambulatory Visit: Payer: Self-pay

## 2023-09-30 ENCOUNTER — Emergency Department
Admission: EM | Admit: 2023-09-30 | Discharge: 2023-09-30 | Disposition: A | Discharge status: Home / Self Care | Attending: Emergency Medicine | Admitting: Emergency Medicine

## 2023-09-30 ENCOUNTER — Ambulatory Visit
Admission: EM | Admit: 2023-09-30 | Discharge: 2023-09-30 | Disposition: A | Discharge status: Home / Self Care | Attending: Emergency Medicine | Admitting: Emergency Medicine

## 2023-09-30 DIAGNOSIS — Z7982 Long term (current) use of aspirin: Secondary | ICD-10-CM | POA: Diagnosis not present

## 2023-09-30 DIAGNOSIS — I2510 Atherosclerotic heart disease of native coronary artery without angina pectoris: Secondary | ICD-10-CM | POA: Diagnosis not present

## 2023-09-30 DIAGNOSIS — R079 Chest pain, unspecified: Secondary | ICD-10-CM

## 2023-09-30 DIAGNOSIS — F419 Anxiety disorder, unspecified: Secondary | ICD-10-CM | POA: Insufficient documentation

## 2023-09-30 DIAGNOSIS — E119 Type 2 diabetes mellitus without complications: Secondary | ICD-10-CM | POA: Diagnosis not present

## 2023-09-30 DIAGNOSIS — Z955 Presence of coronary angioplasty implant and graft: Secondary | ICD-10-CM | POA: Diagnosis not present

## 2023-09-30 DIAGNOSIS — Z79899 Other long term (current) drug therapy: Secondary | ICD-10-CM | POA: Insufficient documentation

## 2023-09-30 DIAGNOSIS — R0789 Other chest pain: Secondary | ICD-10-CM | POA: Insufficient documentation

## 2023-09-30 DIAGNOSIS — R0602 Shortness of breath: Secondary | ICD-10-CM | POA: Diagnosis present

## 2023-09-30 DIAGNOSIS — R4182 Altered mental status, unspecified: Secondary | ICD-10-CM | POA: Diagnosis not present

## 2023-09-30 DIAGNOSIS — N3001 Acute cystitis with hematuria: Secondary | ICD-10-CM

## 2023-09-30 DIAGNOSIS — I200 Unstable angina: Secondary | ICD-10-CM

## 2023-09-30 DIAGNOSIS — S0990XA Unspecified injury of head, initial encounter: Secondary | ICD-10-CM | POA: Diagnosis present

## 2023-09-30 LAB — BASIC METABOLIC PANEL WITH GFR
Anion gap: 11 (ref 5–15)
BUN: 17 mg/dL (ref 8–23)
CO2: 20 mmol/L — ABNORMAL LOW (ref 22–32)
Calcium: 9.1 mg/dL (ref 8.9–10.3)
Chloride: 99 mmol/L (ref 98–111)
Creatinine, Ser: 0.97 mg/dL (ref 0.44–1.00)
GFR, Estimated: 57 mL/min — ABNORMAL LOW (ref >60)
Glucose, Bld: 95 mg/dL (ref 70–99)
Potassium: 3.1 mmol/L — ABNORMAL LOW (ref 3.5–5.1)
Sodium: 130 mmol/L — ABNORMAL LOW (ref 135–145)

## 2023-09-30 LAB — URINALYSIS, ROUTINE W REFLEX MICROSCOPIC
Bilirubin Urine: NEGATIVE
Glucose, UA: NEGATIVE mg/dL
Hgb urine dipstick: NEGATIVE
Ketones, ur: NEGATIVE mg/dL
Nitrite: NEGATIVE
Protein, ur: NEGATIVE mg/dL
Specific Gravity, Urine: 1.005 (ref 1.005–1.030)
pH: 8 (ref 5.0–8.0)

## 2023-09-30 LAB — RESP PANEL BY RT-PCR (RSV, FLU A&B, COVID)  RVPGX2
Influenza A by PCR: NEGATIVE
Influenza B by PCR: NEGATIVE
Resp Syncytial Virus by PCR: NEGATIVE
SARS Coronavirus 2 by RT PCR: NEGATIVE

## 2023-09-30 LAB — HEPATIC FUNCTION PANEL
ALT: 18 U/L (ref 0–44)
AST: 35 U/L (ref 15–41)
Albumin: 3.2 g/dL — ABNORMAL LOW (ref 3.5–5.0)
Alkaline Phosphatase: 41 U/L (ref 38–126)
Bilirubin, Direct: 0.3 mg/dL — ABNORMAL HIGH (ref 0.0–0.2)
Indirect Bilirubin: 1 mg/dL — ABNORMAL HIGH (ref 0.3–0.9)
Total Bilirubin: 1.3 mg/dL — ABNORMAL HIGH (ref 0.0–1.2)
Total Protein: 6.1 g/dL — ABNORMAL LOW (ref 6.5–8.1)

## 2023-09-30 LAB — CBC
HCT: 42.4 % (ref 36.0–46.0)
Hemoglobin: 13.6 g/dL (ref 12.0–15.0)
MCH: 31.9 pg (ref 26.0–34.0)
MCHC: 32.1 g/dL (ref 30.0–36.0)
MCV: 99.3 fL (ref 80.0–100.0)
Platelets: 221 10*3/uL (ref 150–400)
RBC: 4.27 MIL/uL (ref 3.87–5.11)
RDW: 13.1 % (ref 11.5–15.5)
WBC: 7.8 10*3/uL (ref 4.0–10.5)
nRBC: 0 % (ref 0.0–0.2)

## 2023-09-30 LAB — TROPONIN I (HIGH SENSITIVITY)
Troponin I (High Sensitivity): 8 ng/L (ref <18)
Troponin I (High Sensitivity): 8 ng/L (ref <18)

## 2023-09-30 LAB — LIPASE, BLOOD: Lipase: 30 U/L (ref 11–51)

## 2023-09-30 MED ORDER — CLONAZEPAM 0.5 MG PO TABS: 0.2500 mg | ORAL_TABLET | Freq: Two times a day (BID) | ORAL | 0 refills | Status: AC | PRN

## 2023-09-30 MED ORDER — CLONAZEPAM 0.5 MG PO TABS
0.5000 mg | ORAL_TABLET | Freq: Once | ORAL | Status: AC
Start: 2023-09-30 — End: 2023-09-30
  Administered 2023-09-30: 0.5 mg via ORAL
  Filled 2023-09-30: qty 1

## 2023-09-30 MED ORDER — SODIUM CHLORIDE 0.9 % IV BOLUS
500.0000 mL | Freq: Once | INTRAVENOUS | Status: AC
  Administered 2023-09-30: 500 mL via INTRAVENOUS

## 2023-09-30 MED ORDER — ACETAMINOPHEN 500 MG PO TABS
1000.0000 mg | ORAL_TABLET | Freq: Once | ORAL | Status: AC
  Administered 2023-09-30: 1000 mg via ORAL
  Filled 2023-09-30: qty 2

## 2023-09-30 NOTE — ED Provider Notes (Signed)
 [facility] - [place], [place]   Name: Jane [name] DOB: 08-15-1935 MRN: [id number] CSN: [id number] PCP: Rory Collard, MD  Arrival date and time:  09/30/23 1303  Chief Complaint:  Altered Mental Status, Chest Pain, and Shortness of Breath   NOTE: Prior to seeing the patient today, I have reviewed the triage nursing documentation and vital signs. Clinical staff has updated patient's PMH/PSHx, current medication list, and drug allergies/intolerances to ensure comprehensive history available to assist in medical decision making.   History:   HPI: Jane [name] is a 88 y.o. female who presents today with her husband  with complaints of altered mental status and chest pain.  Patient's husband presented to our front desk stating that he believes his wife is having a heart attack in the car.  Staff presented to the car, vital signs taken and she was wheeled into an exam room.    She has a pertinent medical history of coronary artery disease, anxiety, angina and diabetes.  She was recently seen by her primary care provider on 4/15.  Patient started having increased confusion, chest pain and shortness of breath on Wednesday 4/23.  Patient's husband called EMS and was triaged at the home.  She was not transferred to a facility due to stable vital signs and EKG.  She continued to worsen over the next few days, causing her to present here at our facility.  She also endorses poor appetite, though she is staying hydrated by drinking water fluently.  She has also noticed an increased frequency, but no pain with urination.  UA that was collected yesterday shows urinary tract infection; CMP noted with kidney disease.  Past Medical History:  Diagnosis Date   Diverticulitis    Hiatal hernia    Hyperlipidemia    Hypertension    Lymphocytic colitis    Mitral valve prolapse    Osteoporosis    Reflux     Past Surgical History:  Procedure Laterality Date   APPENDECTOMY     CARDIAC  CATHETERIZATION     CATARACT EXTRACTION     CERVICAL DISC SURGERY     CHOLECYSTECTOMY     CORONARY STENT PLACEMENT     FOOT FRACTURE SURGERY     hemangioma     TONSILLECTOMY     TUBAL LIGATION      Family History  Problem Relation Age of Onset   Breast cancer Paternal Aunt    Diabetes Mother    Hypertension Mother    Heart attack Mother     Social History   Tobacco Use   Smoking status: Former    Types: Cigarettes   Smokeless tobacco: Never  Vaping Use   Vaping status: Never Used  Substance Use Topics   Alcohol use: No   Drug use: No    Patient Active Problem List   Diagnosis Date Noted   Unstable angina (HCC) 12/08/2017   Pelvic fracture (HCC) 11/25/2016    Home Medications:    No outpatient medications have been marked as taking for the 09/30/23 encounter [facility] Encounter).    Allergies:   Codeine, Adhesive [tape], Diazepam, and Ezetimibe  Review of Systems (ROS): Review of Systems  Constitutional:  Positive for activity change, appetite change and fatigue.  Respiratory:  Positive for chest tightness and shortness of breath. Negative for wheezing.   Cardiovascular:  Positive for chest pain.  Gastrointestinal:  Negative for diarrhea, nausea and vomiting.  Endocrine: Negative for polydipsia, polyphagia and polyuria.  Genitourinary:  Negative for difficulty urinating, dyspareunia, dysuria, flank pain and frequency.  Musculoskeletal:  Negative for arthralgias.  Neurological:  Positive for dizziness and light-headedness.     Vital Signs: Today's Vitals   09/30/23 1306 09/30/23 1307 09/30/23 1311  BP:   124/60  Pulse:   75  Resp:   16  Temp:   97.7 F (36.5 C)  TempSrc:   Oral  SpO2:   98%  Weight:  164 lb 0.4 oz (74.4 kg)   Height:  5\' 1"  (1.549 m)   PainSc: 8       Physical Exam: Physical Exam Vitals and nursing note reviewed.  Constitutional:      Appearance: Normal appearance.  Cardiovascular:     Rate and Rhythm: Regular rhythm.  Tachycardia present.     Pulses: Normal pulses.     Heart sounds: Normal heart sounds.  Pulmonary:     Effort: Tachypnea present. No accessory muscle usage or respiratory distress.     Breath sounds: Normal breath sounds.  Musculoskeletal:     Right lower leg: No edema.  Skin:    General: Skin is warm and dry.  Neurological:     Mental Status: She is alert.  Psychiatric:        Attention and Perception: Attention normal.        Mood and Affect: Mood is anxious.        Speech: Speech is delayed.        Behavior: Behavior normal.        Thought Content: Thought content normal.      Urgent Care Treatments / Results:   LABS: PLEASE NOTE: all labs that were ordered this encounter are listed, however only abnormal results are displayed. Labs Reviewed - No data to display  EKG: -None  RADIOLOGY: No results found.  PROCEDURES: ED EKG  Date/Time: 09/30/2023 1:47 PM  Performed by: Providence Brow, NP Authorized by: Providence Brow, NP   Previous ECG:    Previous ECG:  Compared to current   Similarity:  No change Interpretation:    Interpretation: normal   Rate:    ECG rate assessment: normal   Rhythm:    Rhythm: sinus rhythm   Ectopy:    Ectopy: none     MEDICATIONS RECEIVED THIS VISIT: Medications - No data to display  PERTINENT CLINICAL COURSE NOTES/UPDATES:   Initial Impression / Assessment and Plan / Urgent Care Course:  Pertinent labs & imaging results that were available during my care of the patient were personally reviewed by me and considered in my medical decision making (see lab/imaging section of note for values and interpretations).  Jane [name] is a 88 y.o. female who presents to [facility] today with complaints of confusion and chest pain, diagnosed with UTI with possible sepsis.  Due to her continued confusion and shortness of breath, believe she would be best served in the emergency department.  Patient and husband agree.  Patient  transferred via EMS.  Final Clinical Impressions / Urgent Care Diagnoses:   Final diagnoses:  Altered mental status, unspecified altered mental status type  Acute cystitis with hematuria    New Prescriptions:  [place] Controlled Substance Registry consulted? Not Applicable  No orders of the defined types were placed in this encounter.   Discharge Instructions   None     Recommended Follow up Care:  Patient encouraged to follow up with the following provider within the specified time frame, or sooner as dictated by the severity of  her symptoms. As always, she was instructed that for any urgent/emergent care needs, she should seek care either here or in the emergency department for more immediate evaluation.   Providence Brow, DNP, NP-c   Providence Brow, NP 09/30/23 1400

## 2023-09-30 NOTE — Discharge Instructions (Addendum)
 You should call your primary care doctor to get follow-up for your anxiety.  I prescribed you the Klonopin that you have been on previously where you take a half a tablet once in the morning and a half a tablet as needed.  Go through your medications and ensure that you do not have additional Klonopin or clonazepam that you would actually take with it but I do not see that you have had a fill since March.  You should follow-up with Dr. Beau Bound for your heart and discuss this chest pain with him as well.  You should return to the ER if you develop a change or worsening symptoms or any other concerns.  Drink gatorade without sugar for your sodium and recheck with PCP

## 2023-09-30 NOTE — ED Notes (Signed)
 FIRST RN: pt from [facility], was sent for possible UTI.

## 2023-09-30 NOTE — ED Provider Notes (Addendum)
 [facility] Provider Note    Event Date/Time   First MD Initiated Contact with Patient 09/30/23 1513     (approximate)   History   Chest Pain   HPI  Jane [name] is a 88 y.o. female with history of CAD, diabetes, anxiety, hyperlipidemia who comes in with chest pain.  I reviewed the office visit note from 09/19/2023 patient is on Plavix  and aspirin  for prophylaxis for coronary disease.  Patient was seen yesterday for UTI as been having some chest pain and anxiety attacks since 4/23.  Patient reports that since Wednesday she has had a pressure in the sternum.  She reports already having her gallbladder removed and denies really any abdominal pain.  She reports feeling a little bit more confused as well since Wednesday just confusion on which day she went and saw the doctor.  She still alert and oriented x 4.  She states that she has not been started on any antibiotics they stated that it could be a UTI that she was getting get a urine culture before making any decisions on antibiotics.  She denies any urinary symptoms.  She states that her shortness of breath is not really that she feels short of breath and what she just feels anxious.  She does report severe anxiety and is not even sure if she is taking her Celexa that is listed in the primary care doctor note.  She reports being out of the Klonopin that was prescribed to her back in March.  She reports thinking that her chest pain is all related to her anxiety and that she just feels very stressed about things that normally she takes the Klonopin and that helps her symptoms but she has not had it.    I reviewed her cardiology note from 07/14/2023 PMH significant for ASCVD, CAD, stent, mitral valve prolapse, DOE, angina, edema, hypertension, hyperlipidemia, diabetes, obesity, GERD.    Physical Exam   Triage Vital Signs: ED Triage Vitals  Encounter Vitals Group     BP 09/30/23 1429 128/67     Systolic BP  Percentile --      Diastolic BP Percentile --      Pulse Rate 09/30/23 1429 65     Resp 09/30/23 1429 18     Temp 09/30/23 1429 97.6 F (36.4 C)     Temp Source 09/30/23 1429 Oral     SpO2 09/30/23 1429 100 %     Weight 09/30/23 1437 160 lb (72.6 kg)     Height 09/30/23 1437 5\' 2"  (1.575 m)     Head Circumference --      Peak Flow --      Pain Score 09/30/23 1437 6     Pain Loc --      Pain Education --      Exclude from Growth Chart --     Most recent vital signs: Vitals:   09/30/23 1429  BP: 128/67  Pulse: 65  Resp: 18  Temp: 97.6 F (36.4 C)  SpO2: 100%     General: Awake, no distress.  CV:  Good peripheral perfusion.  No murmur Resp:  Normal effort.  Clear lungs Abd:  No distention.  Soft and nontender Other:  Cranial nerves II through XII are intact.  Equal strength in arms and legs. Alert and oriented x 4  ED Results / Procedures / Treatments   Labs (all labs ordered are listed, but only abnormal results are displayed) Labs Reviewed  URINALYSIS,  ROUTINE W REFLEX MICROSCOPIC - Abnormal; Notable for the following components:      Result Value   Color, Urine YELLOW (*)    APPearance CLEAR (*)    Leukocytes,Ua TRACE (*)    Bacteria, UA RARE (*)    All other components within normal limits  CBC  BASIC METABOLIC PANEL WITH GFR  TROPONIN I (HIGH SENSITIVITY)     EKG  My interpretation of EKG:  Normal sinus rate of 66 without any ST elevation or T wave inversions, normal intervals  RADIOLOGY I have reviewed the xray personally and interpreted no evidence of any pneumonia   PROCEDURES:  Critical Care performed: No  Procedures   MEDICATIONS ORDERED IN ED: Medications  sodium chloride  0.9 % bolus 500 mL (0 mLs Intravenous Stopped 09/30/23 1810)  clonazePAM (KLONOPIN) tablet 0.5 mg (0.5 mg Oral Given 09/30/23 1624)  acetaminophen  (TYLENOL ) tablet 1,000 mg (1,000 mg Oral Given 09/30/23 1624)     IMPRESSION / MDM / ASSESSMENT AND PLAN / ED COURSE   I reviewed the triage vital signs and the nursing notes.   Patient's presentation is most consistent with acute presentation with potential threat to life or bodily function.  Workup done to evaluate for intercranial hemorrhage, mass, UTI, electro abnormalities, AKI.  Could be a component of anxiety given patient does report significant history of this.  Will give her some fluids, Tylenol , Klonopin given that that she has been on previously at home.  No signs or symptoms to suggest DVT, PE.  That she does not really have the shortness of breath it is more of a chest pressure.  Doubt dissection.  No radiation to the back, no murmur sensation intact throughout.  Urine was rare bacteria no WBCs trace leuks negative nitrites.  CBC reassuring.  Sodium slightly low at 130 which she does appear to have some low sodiums previously.  Troponin was 8  Discussed with radiology Dr Miachel Adams about the x-ray showing the a torturous aorta but she stated that it looks the same as a prior CT scan and she has no other symptoms to suggest this being a dissection the radiologist agrees no widened mediastinum's or other signs to suggest dissection and this is unchanged from CT in March.   6:15 PM repeat evaluation patient reports feeling much improved.  She has resolution of chest pain  I reviewed the note where patient was seen on 2/25 by her primary doctor and they had prescribed her clonazepam  Clonazepam 0.5 mg, 1/2 tab every AM; can take an extra 1/2-1 tab as needed for acute stress/panic feelings  However on the database patient has not had any fills since March so I do suspect that running out of the clonazepam could be contributing the patient's symptoms.  Patient continues to feel much improved.  She understands that she should be taking a half of tablet in the morning and then a half a tablet as needed once daily.  She understands that she needs to call her primary care doctor and follow-up this week with them.   Her husband is there also to help.  We discussed making sure she is not taking additional clonazepam and make sure that she did truly run out and they expressed understanding.  They understand that there is a risk with this medication including oversedation but they stated that she feels much improved after receiving it today and feel comfortable with discharge home   Consider admission for patient given patient's age but patient is alert  and oriented x 4 she reports feeling much better and she would prefer discharge home.\  Discussed gatorade for her low sugar and ambulatory with nurse.   The patient is on the cardiac monitor to evaluate for evidence of arrhythmia and/or significant heart rate changes.      FINAL CLINICAL IMPRESSION(S) / ED DIAGNOSES   Final diagnoses:  Nonspecific chest pain  Anxiety     Rx / DC Orders   ED Discharge Orders     None        Note:  This document was prepared using Dragon voice recognition software and may include unintentional dictation errors.   Lubertha Rush, MD 09/30/23 Darnell Elbe    Lubertha Rush, MD 09/30/23 Avelino Lek    Lubertha Rush, MD 09/30/23 [phone]

## 2023-09-30 NOTE — ED Notes (Signed)
 Ambulated Pt at this time with minimal assistance. Denies dizziness, MD notified

## 2023-09-30 NOTE — ED Triage Notes (Signed)
 Patient was brought in here by my her husband to UC.  Patient's husband states that "she might be having a heart attack."  Patient states that she has been having chest pain, SOB and confusion for the past 3 days.  Patient states that she has recenlty had labwork done and EMS called to her house but does not remember which day.

## 2023-09-30 NOTE — ED Notes (Signed)
 Patient is being discharged from the Urgent Care and sent to the Emergency Department via EMS . Per Providence Brow, NP, patient is in need of higher level of care due to Chest pain, altered mental status, shortness of breath and lethargy. Patient is aware and verbalizes understanding of plan of care.  Vitals:   09/30/23 1311  BP: 124/60  Pulse: 75  Resp: 16  Temp: 97.7 F (36.5 C)  SpO2: 98%

## 2023-09-30 NOTE — ED Triage Notes (Signed)
 Pt BIB [facility] for c/c of chest pain/anxiety attacks since 4/23. Patient went to [facility] for the same as well as known UTI that was found 4/24 today and sent here for further workup. Patient in no distress at triage.

## 2023-10-02 LAB — URINE CULTURE: Culture: 20000 — AB

## 2024-04-30 ENCOUNTER — Other Ambulatory Visit: Payer: Self-pay | Admitting: Family Medicine

## 2024-04-30 DIAGNOSIS — Z1231 Encounter for screening mammogram for malignant neoplasm of breast: Secondary | ICD-10-CM

## 2024-06-03 ENCOUNTER — Observation Stay

## 2024-06-03 ENCOUNTER — Emergency Department

## 2024-06-03 ENCOUNTER — Inpatient Hospital Stay
Admission: EM | Admit: 2024-06-03 | Discharge: 2024-06-06 | DRG: 194 | Disposition: A | Discharge status: Home Health | Attending: Obstetrics and Gynecology | Admitting: Obstetrics and Gynecology

## 2024-06-03 DIAGNOSIS — I959 Hypotension, unspecified: Secondary | ICD-10-CM | POA: Diagnosis present

## 2024-06-03 DIAGNOSIS — Z87891 Personal history of nicotine dependence: Secondary | ICD-10-CM

## 2024-06-03 DIAGNOSIS — J209 Acute bronchitis, unspecified: Secondary | ICD-10-CM | POA: Diagnosis present

## 2024-06-03 DIAGNOSIS — Z1152 Encounter for screening for COVID-19: Secondary | ICD-10-CM

## 2024-06-03 DIAGNOSIS — Z8249 Family history of ischemic heart disease and other diseases of the circulatory system: Secondary | ICD-10-CM

## 2024-06-03 DIAGNOSIS — M549 Dorsalgia, unspecified: Secondary | ICD-10-CM | POA: Diagnosis present

## 2024-06-03 DIAGNOSIS — D649 Anemia, unspecified: Secondary | ICD-10-CM | POA: Diagnosis present

## 2024-06-03 DIAGNOSIS — I2489 Other forms of acute ischemic heart disease: Secondary | ICD-10-CM | POA: Diagnosis present

## 2024-06-03 DIAGNOSIS — I10 Essential (primary) hypertension: Secondary | ICD-10-CM | POA: Diagnosis present

## 2024-06-03 DIAGNOSIS — Z7902 Long term (current) use of antithrombotics/antiplatelets: Secondary | ICD-10-CM

## 2024-06-03 DIAGNOSIS — I2510 Atherosclerotic heart disease of native coronary artery without angina pectoris: Secondary | ICD-10-CM | POA: Diagnosis present

## 2024-06-03 DIAGNOSIS — Z7983 Long term (current) use of bisphosphonates: Secondary | ICD-10-CM

## 2024-06-03 DIAGNOSIS — E871 Hypo-osmolality and hyponatremia: Secondary | ICD-10-CM | POA: Diagnosis present

## 2024-06-03 DIAGNOSIS — J101 Influenza due to other identified influenza virus with other respiratory manifestations: Principal | ICD-10-CM | POA: Diagnosis present

## 2024-06-03 DIAGNOSIS — E872 Acidosis, unspecified: Secondary | ICD-10-CM | POA: Diagnosis present

## 2024-06-03 DIAGNOSIS — E861 Hypovolemia: Secondary | ICD-10-CM | POA: Diagnosis present

## 2024-06-03 DIAGNOSIS — F419 Anxiety disorder, unspecified: Secondary | ICD-10-CM | POA: Diagnosis present

## 2024-06-03 DIAGNOSIS — Z6832 Body mass index (BMI) 32.0-32.9, adult: Secondary | ICD-10-CM

## 2024-06-03 DIAGNOSIS — I5032 Chronic diastolic (congestive) heart failure: Secondary | ICD-10-CM | POA: Diagnosis present

## 2024-06-03 DIAGNOSIS — N1831 Chronic kidney disease, stage 3a: Secondary | ICD-10-CM | POA: Diagnosis present

## 2024-06-03 DIAGNOSIS — W1830XA Fall on same level, unspecified, initial encounter: Secondary | ICD-10-CM | POA: Diagnosis present

## 2024-06-03 DIAGNOSIS — Z7982 Long term (current) use of aspirin: Secondary | ICD-10-CM

## 2024-06-03 DIAGNOSIS — M810 Age-related osteoporosis without current pathological fracture: Secondary | ICD-10-CM | POA: Diagnosis present

## 2024-06-03 DIAGNOSIS — E1122 Type 2 diabetes mellitus with diabetic chronic kidney disease: Secondary | ICD-10-CM | POA: Diagnosis present

## 2024-06-03 DIAGNOSIS — Z9049 Acquired absence of other specified parts of digestive tract: Secondary | ICD-10-CM

## 2024-06-03 DIAGNOSIS — E66811 Obesity, class 1: Secondary | ICD-10-CM | POA: Diagnosis present

## 2024-06-03 DIAGNOSIS — Z79899 Other long term (current) drug therapy: Secondary | ICD-10-CM

## 2024-06-03 DIAGNOSIS — Z833 Family history of diabetes mellitus: Secondary | ICD-10-CM

## 2024-06-03 DIAGNOSIS — J111 Influenza due to unidentified influenza virus with other respiratory manifestations: Principal | ICD-10-CM

## 2024-06-03 DIAGNOSIS — D539 Nutritional anemia, unspecified: Secondary | ICD-10-CM | POA: Diagnosis present

## 2024-06-03 DIAGNOSIS — Z955 Presence of coronary angioplasty implant and graft: Secondary | ICD-10-CM

## 2024-06-03 DIAGNOSIS — R531 Weakness: Secondary | ICD-10-CM

## 2024-06-03 DIAGNOSIS — R3129 Other microscopic hematuria: Secondary | ICD-10-CM | POA: Diagnosis present

## 2024-06-03 DIAGNOSIS — Z87440 Personal history of urinary (tract) infections: Secondary | ICD-10-CM

## 2024-06-03 DIAGNOSIS — E876 Hypokalemia: Secondary | ICD-10-CM | POA: Diagnosis present

## 2024-06-03 DIAGNOSIS — Z803 Family history of malignant neoplasm of breast: Secondary | ICD-10-CM

## 2024-06-03 DIAGNOSIS — F32A Depression, unspecified: Secondary | ICD-10-CM | POA: Insufficient documentation

## 2024-06-03 DIAGNOSIS — M4856XA Collapsed vertebra, not elsewhere classified, lumbar region, initial encounter for fracture: Secondary | ICD-10-CM | POA: Diagnosis present

## 2024-06-03 DIAGNOSIS — Y92013 Bedroom of single-family (private) house as the place of occurrence of the external cause: Secondary | ICD-10-CM

## 2024-06-03 DIAGNOSIS — M4854XA Collapsed vertebra, not elsewhere classified, thoracic region, initial encounter for fracture: Secondary | ICD-10-CM | POA: Diagnosis present

## 2024-06-03 DIAGNOSIS — I130 Hypertensive heart and chronic kidney disease with heart failure and stage 1 through stage 4 chronic kidney disease, or unspecified chronic kidney disease: Secondary | ICD-10-CM | POA: Diagnosis present

## 2024-06-03 DIAGNOSIS — G8929 Other chronic pain: Secondary | ICD-10-CM | POA: Diagnosis present

## 2024-06-03 DIAGNOSIS — E119 Type 2 diabetes mellitus without complications: Secondary | ICD-10-CM

## 2024-06-03 DIAGNOSIS — E785 Hyperlipidemia, unspecified: Secondary | ICD-10-CM | POA: Diagnosis present

## 2024-06-03 DIAGNOSIS — E860 Dehydration: Secondary | ICD-10-CM | POA: Diagnosis present

## 2024-06-03 LAB — IRON AND TIBC
Iron: 19 ug/dL — ABNORMAL LOW (ref 28–170)
Saturation Ratios: 6 % — ABNORMAL LOW (ref 10.4–31.8)
TIBC: 291 ug/dL (ref 250–450)
UIBC: 273 ug/dL

## 2024-06-03 LAB — URINALYSIS, ROUTINE W REFLEX MICROSCOPIC
Bacteria, UA: NONE SEEN
Bilirubin Urine: NEGATIVE
Glucose, UA: NEGATIVE mg/dL
Ketones, ur: NEGATIVE mg/dL
Leukocytes,Ua: NEGATIVE
Nitrite: NEGATIVE
Protein, ur: NEGATIVE mg/dL
Specific Gravity, Urine: 1.021 (ref 1.005–1.030)
Squamous Epithelial / HPF: 0 /HPF (ref 0–5)
pH: 5 (ref 5.0–8.0)

## 2024-06-03 LAB — COMPREHENSIVE METABOLIC PANEL WITH GFR
ALT: 13 U/L (ref 0–44)
AST: 17 U/L (ref 15–41)
Albumin: 3.1 g/dL — ABNORMAL LOW (ref 3.5–5.0)
Alkaline Phosphatase: 59 U/L (ref 38–126)
Anion gap: 12 (ref 5–15)
BUN: 22 mg/dL (ref 8–23)
CO2: 22 mmol/L (ref 22–32)
Calcium: 8.9 mg/dL (ref 8.9–10.3)
Chloride: 99 mmol/L (ref 98–111)
Creatinine, Ser: 1.13 mg/dL — ABNORMAL HIGH (ref 0.44–1.00)
GFR, Estimated: 47 mL/min — ABNORMAL LOW
Glucose, Bld: 146 mg/dL — ABNORMAL HIGH (ref 70–99)
Potassium: 3.2 mmol/L — ABNORMAL LOW (ref 3.5–5.1)
Sodium: 133 mmol/L — ABNORMAL LOW (ref 135–145)
Total Bilirubin: 0.2 mg/dL (ref 0.0–1.2)
Total Protein: 5.3 g/dL — ABNORMAL LOW (ref 6.5–8.1)

## 2024-06-03 LAB — RETICULOCYTES
Immature Retic Fract: 10.3 % (ref 2.3–15.9)
RBC.: 3.6 MIL/uL — ABNORMAL LOW (ref 3.87–5.11)
Retic Count, Absolute: 71.6 K/uL (ref 19.0–186.0)
Retic Ct Pct: 2 % (ref 0.4–3.1)

## 2024-06-03 LAB — BASIC METABOLIC PANEL WITH GFR
Anion gap: 8 (ref 5–15)
BUN: 19 mg/dL (ref 8–23)
CO2: 25 mmol/L (ref 22–32)
Calcium: 9.4 mg/dL (ref 8.9–10.3)
Chloride: 103 mmol/L (ref 98–111)
Creatinine, Ser: 1.01 mg/dL — ABNORMAL HIGH (ref 0.44–1.00)
GFR, Estimated: 54 mL/min — ABNORMAL LOW
Glucose, Bld: 108 mg/dL — ABNORMAL HIGH (ref 70–99)
Potassium: 3.6 mmol/L (ref 3.5–5.1)
Sodium: 136 mmol/L (ref 135–145)

## 2024-06-03 LAB — LACTIC ACID, PLASMA
Lactic Acid, Venous: 2.2 mmol/L (ref 0.5–1.9)
Lactic Acid, Venous: 1.2 mmol/L (ref 0.5–1.9)

## 2024-06-03 LAB — RESP PANEL BY RT-PCR (RSV, FLU A&B, COVID)  RVPGX2
Influenza A by PCR: POSITIVE — AB
Influenza B by PCR: NEGATIVE
Resp Syncytial Virus by PCR: NEGATIVE
SARS Coronavirus 2 by RT PCR: NEGATIVE

## 2024-06-03 LAB — CBC WITH DIFFERENTIAL/PLATELET
Abs Immature Granulocytes: 0.07 K/uL (ref 0.00–0.07)
Basophils Absolute: 0 K/uL (ref 0.0–0.1)
Basophils Relative: 0 %
Eosinophils Absolute: 0 K/uL (ref 0.0–0.5)
Eosinophils Relative: 0 %
HCT: 20.6 % — ABNORMAL LOW (ref 36.0–46.0)
Hemoglobin: 6.6 g/dL — ABNORMAL LOW (ref 12.0–15.0)
Immature Granulocytes: 1 %
Lymphocytes Relative: 6 %
Lymphs Abs: 0.6 K/uL — ABNORMAL LOW (ref 0.7–4.0)
MCH: 33.7 pg (ref 26.0–34.0)
MCHC: 32 g/dL (ref 30.0–36.0)
MCV: 105.1 fL — ABNORMAL HIGH (ref 80.0–100.0)
Monocytes Absolute: 1.1 K/uL — ABNORMAL HIGH (ref 0.1–1.0)
Monocytes Relative: 12 %
Neutro Abs: 7.6 K/uL (ref 1.7–7.7)
Neutrophils Relative %: 81 %
Platelets: 228 K/uL (ref 150–400)
RBC: 1.96 MIL/uL — ABNORMAL LOW (ref 3.87–5.11)
RDW: 13.3 % (ref 11.5–15.5)
WBC: 9.5 K/uL (ref 4.0–10.5)
nRBC: 0 % (ref 0.0–0.2)

## 2024-06-03 LAB — CBC
HCT: 34.9 % — ABNORMAL LOW (ref 36.0–46.0)
Hemoglobin: 11.5 g/dL — ABNORMAL LOW (ref 12.0–15.0)
MCH: 33.2 pg (ref 26.0–34.0)
MCHC: 33 g/dL (ref 30.0–36.0)
MCV: 100.9 fL — ABNORMAL HIGH (ref 80.0–100.0)
Platelets: 174 K/uL (ref 150–400)
RBC: 3.46 MIL/uL — ABNORMAL LOW (ref 3.87–5.11)
RDW: 13.7 % (ref 11.5–15.5)
WBC: 5.9 K/uL (ref 4.0–10.5)
nRBC: 0 % (ref 0.0–0.2)

## 2024-06-03 LAB — TROPONIN T, HIGH SENSITIVITY
Troponin T High Sensitivity: 38 ng/L — ABNORMAL HIGH (ref 0–19)
Troponin T High Sensitivity: 32 ng/L — ABNORMAL HIGH (ref 0–19)

## 2024-06-03 LAB — HEMOGLOBIN AND HEMATOCRIT, BLOOD
HCT: 35.8 % — ABNORMAL LOW (ref 36.0–46.0)
HCT: 37 % (ref 36.0–46.0)
Hemoglobin: 11.6 g/dL — ABNORMAL LOW (ref 12.0–15.0)
Hemoglobin: 11.9 g/dL — ABNORMAL LOW (ref 12.0–15.0)

## 2024-06-03 LAB — FOLATE: Folate: 13.3 ng/mL

## 2024-06-03 LAB — PREPARE RBC (CROSSMATCH)

## 2024-06-03 LAB — MAGNESIUM: Magnesium: 2 mg/dL (ref 1.7–2.4)

## 2024-06-03 LAB — VITAMIN B12: Vitamin B-12: 298 pg/mL (ref 180–914)

## 2024-06-03 LAB — FERRITIN: Ferritin: 273 ng/mL (ref 11–307)

## 2024-06-03 LAB — LACTATE DEHYDROGENASE: LDH: 197 U/L (ref 105–235)

## 2024-06-03 LAB — ABO/RH: ABO/RH(D): A POS

## 2024-06-03 MED ORDER — POTASSIUM CHLORIDE ER 10 MEQ PO TBCR
10.0000 meq | EXTENDED_RELEASE_TABLET | Freq: Two times a day (BID) | ORAL | Status: DC
  Administered 2024-06-03: 10 meq via ORAL
  Filled 2024-06-03 (×3): qty 1

## 2024-06-03 MED ORDER — PREDNISONE 20 MG PO TABS
40.0000 mg | ORAL_TABLET | Freq: Every day | ORAL | Status: DC
  Administered 2024-06-03: 40 mg via ORAL
  Filled 2024-06-03 (×2): qty 2

## 2024-06-03 MED ORDER — PANTOPRAZOLE SODIUM 40 MG IV SOLR
40.0000 mg | Freq: Two times a day (BID) | INTRAVENOUS | Status: DC
  Administered 2024-06-03: 40 mg via INTRAVENOUS
  Filled 2024-06-03: qty 10

## 2024-06-03 MED ORDER — SODIUM CHLORIDE 0.9 % IV BOLUS
500.0000 mL | Freq: Once | INTRAVENOUS | Status: AC
  Administered 2024-06-03: 500 mL via INTRAVENOUS

## 2024-06-03 MED ORDER — BACLOFEN 10 MG PO TABS: 10.0000 mg | ORAL_TABLET | Freq: Three times a day (TID) | ORAL | Status: DC

## 2024-06-03 MED ORDER — SODIUM CHLORIDE 0.9 % IV SOLN: Freq: Once | INTRAVENOUS | Status: DC

## 2024-06-03 MED ORDER — SODIUM CHLORIDE 0.9 % IV SOLN
10.0000 mL/h | Freq: Once | INTRAVENOUS | Status: AC
  Administered 2024-06-03: 10 mL/h via INTRAVENOUS

## 2024-06-03 MED ORDER — CHOLESTYRAMINE LIGHT 4 G PO PACK: 4.0000 g | PACK | Freq: Two times a day (BID) | ORAL | Status: DC

## 2024-06-03 MED ORDER — CARVEDILOL 6.25 MG PO TABS: 6.2500 mg | ORAL_TABLET | Freq: Two times a day (BID) | ORAL | Status: DC

## 2024-06-03 MED ORDER — TRAZODONE HCL 50 MG PO TABS
25.0000 mg | ORAL_TABLET | Freq: Every evening | ORAL | Status: DC | PRN
  Administered 2024-06-04 – 2024-06-05 (×2): 25 mg via ORAL
  Filled 2024-06-03 (×2): qty 1

## 2024-06-03 MED ORDER — NITROGLYCERIN 0.4 MG SL SUBL
0.4000 mg | SUBLINGUAL_TABLET | SUBLINGUAL | Status: DC | PRN
  Administered 2024-06-04: 0.4 mg via SUBLINGUAL
  Filled 2024-06-03 (×3): qty 1

## 2024-06-03 MED ORDER — ONDANSETRON HCL 4 MG/2ML IJ SOLN: 4.0000 mg | Freq: Four times a day (QID) | INTRAMUSCULAR | Status: DC | PRN

## 2024-06-03 MED ORDER — TRAMADOL HCL 50 MG PO TABS: 50.0000 mg | ORAL_TABLET | Freq: Four times a day (QID) | ORAL | Status: DC | PRN

## 2024-06-03 MED ORDER — IPRATROPIUM-ALBUTEROL 0.5-2.5 (3) MG/3ML IN SOLN
3.0000 mL | Freq: Once | RESPIRATORY_TRACT | Status: AC
  Administered 2024-06-03: 3 mL via RESPIRATORY_TRACT
  Filled 2024-06-03: qty 3

## 2024-06-03 MED ORDER — POTASSIUM CHLORIDE 20 MEQ PO PACK
20.0000 meq | PACK | Freq: Once | ORAL | Status: AC
  Administered 2024-06-03: 20 meq via ORAL
  Filled 2024-06-03: qty 1

## 2024-06-03 MED ORDER — QUINAPRIL HCL 10 MG PO TABS: 20.0000 mg | ORAL_TABLET | Freq: Every day | ORAL | Status: DC

## 2024-06-03 MED ORDER — CARVEDILOL 6.25 MG PO TABS
6.2500 mg | ORAL_TABLET | Freq: Two times a day (BID) | ORAL | Status: DC
  Administered 2024-06-03 – 2024-06-05 (×3): 6.25 mg via ORAL
  Filled 2024-06-03 (×3): qty 1

## 2024-06-03 MED ORDER — LABETALOL HCL 100 MG PO TABS: 100.0000 mg | ORAL_TABLET | Freq: Two times a day (BID) | ORAL | Status: DC

## 2024-06-03 MED ORDER — CLOPIDOGREL BISULFATE 75 MG PO TABS
75.0000 mg | ORAL_TABLET | Freq: Every day | ORAL | Status: DC
  Administered 2024-06-03 – 2024-06-06 (×4): 75 mg via ORAL
  Filled 2024-06-03 (×4): qty 1

## 2024-06-03 MED ORDER — ONDANSETRON HCL 4 MG PO TABS: 4.0000 mg | ORAL_TABLET | Freq: Four times a day (QID) | ORAL | Status: DC | PRN

## 2024-06-03 MED ORDER — IPRATROPIUM-ALBUTEROL 0.5-2.5 (3) MG/3ML IN SOLN: 3.0000 mL | Freq: Four times a day (QID) | RESPIRATORY_TRACT | Status: DC | PRN

## 2024-06-03 MED ORDER — ISOSORBIDE DINITRATE 30 MG PO TABS: 30.0000 mg | ORAL_TABLET | Freq: Two times a day (BID) | ORAL | Status: DC

## 2024-06-03 MED ORDER — ISOSORBIDE MONONITRATE ER 30 MG PO TB24
30.0000 mg | ORAL_TABLET | Freq: Two times a day (BID) | ORAL | Status: DC
  Administered 2024-06-03 – 2024-06-06 (×5): 30 mg via ORAL
  Filled 2024-06-03 (×5): qty 1

## 2024-06-03 MED ORDER — MAGNESIUM HYDROXIDE 400 MG/5ML PO SUSP: 30.0000 mL | Freq: Every day | ORAL | Status: DC | PRN

## 2024-06-03 MED ORDER — ACETAMINOPHEN 325 MG PO TABS
650.0000 mg | ORAL_TABLET | Freq: Four times a day (QID) | ORAL | Status: DC | PRN
  Administered 2024-06-03: 650 mg via ORAL
  Filled 2024-06-03: qty 2

## 2024-06-03 MED ORDER — SODIUM CHLORIDE 0.9 % IV SOLN: INTRAVENOUS | Status: DC

## 2024-06-03 MED ORDER — PANTOPRAZOLE SODIUM 40 MG PO TBEC
40.0000 mg | DELAYED_RELEASE_TABLET | Freq: Every day | ORAL | Status: DC
  Administered 2024-06-03 – 2024-06-06 (×4): 40 mg via ORAL
  Filled 2024-06-03 (×4): qty 1

## 2024-06-03 MED ORDER — ALPRAZOLAM 0.25 MG PO TABS
0.2500 mg | ORAL_TABLET | Freq: Two times a day (BID) | ORAL | Status: DC | PRN
  Administered 2024-06-03 – 2024-06-04 (×2): 0.25 mg via ORAL
  Filled 2024-06-03 (×2): qty 1

## 2024-06-03 MED ORDER — OSELTAMIVIR PHOSPHATE 30 MG PO CAPS
30.0000 mg | ORAL_CAPSULE | Freq: Two times a day (BID) | ORAL | Status: DC
  Administered 2024-06-03 – 2024-06-06 (×5): 30 mg via ORAL
  Filled 2024-06-03 (×8): qty 1

## 2024-06-03 MED ORDER — ACETAMINOPHEN 650 MG RE SUPP: 650.0000 mg | Freq: Four times a day (QID) | RECTAL | Status: DC | PRN

## 2024-06-03 MED ORDER — CITALOPRAM HYDROBROMIDE 20 MG PO TABS
20.0000 mg | ORAL_TABLET | Freq: Every day | ORAL | Status: DC
  Administered 2024-06-03 – 2024-06-06 (×4): 20 mg via ORAL
  Filled 2024-06-03 (×4): qty 1

## 2024-06-03 MED ORDER — VITAMIN D 25 MCG (1000 UNIT) PO TABS
2000.0000 [IU] | ORAL_TABLET | Freq: Every day | ORAL | Status: DC
  Administered 2024-06-03 – 2024-06-06 (×4): 2000 [IU] via ORAL
  Filled 2024-06-03 (×4): qty 2

## 2024-06-03 MED ORDER — CALCIUM CARBONATE ANTACID 500 MG PO CHEW: 2.0000 | CHEWABLE_TABLET | Freq: Every day | ORAL | Status: DC

## 2024-06-03 MED ORDER — IOHEXOL 300 MG/ML  SOLN
100.0000 mL | Freq: Once | INTRAMUSCULAR | Status: AC | PRN
  Administered 2024-06-03: 100 mL via INTRAVENOUS

## 2024-06-03 MED ORDER — OSELTAMIVIR PHOSPHATE 75 MG PO CAPS
75.0000 mg | ORAL_CAPSULE | Freq: Once | ORAL | Status: AC
  Administered 2024-06-03: 75 mg via ORAL
  Filled 2024-06-03 (×2): qty 1

## 2024-06-03 NOTE — ED Notes (Signed)
 Pt pulled out an iv.  Pt confused.

## 2024-06-03 NOTE — H&P (Addendum)
 "     Jane [name]   PATIENT NAME: Jane [name]    MR#:  [id number]  DATE OF BIRTH:  [birth year]  DATE OF ADMISSION:  06/03/2024  PRIMARY CARE PHYSICIAN: Glover Lenis, MD   Patient is coming from: Home  REQUESTING/REFERRING PHYSICIAN: Floy Roberts, MD  CHIEF COMPLAINT:   Chief Complaint  Patient presents with   Fall   Weakness    HISTORY OF PRESENT ILLNESS:  Jane [name] is a 88 y.o. Caucasian female with medical history significant for essential hypertension, dyslipidemia, mitral valve prolapse, osteoarthritis, GERD, lymphocytic colitis and diverticulitis, presented to the emergency room with acute onset of generalized weakness, tiredness and fall.  She stated that she is getting up to use the restroom and felt both of her legs gave out from under her and she felt onto the ground.  She denied any head injuries or other significant injuries.  She has been having URI symptoms recently and admitted to sick exposure.  She admitted to palpitations and dyspnea before her fall.  She has been having occasional dry cough and wheezing.  She stated that she had intermittent chest pain which has been relieved with sublingual effortlessly.  Per EMS her systolic BP was in the 80s and she had a temperature of 101.6.  She was given 1 g of p.o. Tylenol  and 500 mL IV lactated ringer she is starting having dysuria and malodorous urine yesterday for which she took Azo.  No nausea or vomiting or abdominal pain.  She had fever here that was up to 101.6 at home.  No chest pain or palpitations.  She has been having diarrhea without melena or bright red knee per rectum.  ED Course: When she came to the ER, temperature was 99.4 with otherwise normal vital signs.  Labs revealed hyponatremia 133 and hypokalemia of 3.2 blood glucose 146 and creatinine 1.13, albumin 3.1 and total protein 5.3.  High sensitive troponin T was 38 and later 32.  Lactic acid was 2.2 and CBC showed hemoglobin of 6.6 and hematocrit  20.6 with macrocytosis compared to 13.6 and 42.4 on 09/30/2023.  Respiratory panel came back positive for influenza A. EKG as reviewed by me :  EKG showed sinus rhythm with a rate of 80 with short PR interval. Imaging: Portable chest x-ray showed no acute cardiopulmonary disease.  Short course of interstitial chronic markings.  The patient was given 500 mL IV normal saline bolus and DuoNeb.  She will be admitted to a progressive unit observation bed for further evaluation and management PAST MEDICAL HISTORY:   Past Medical History:  Diagnosis Date   Diverticulitis    Hiatal hernia    Hyperlipidemia    Hypertension    Lymphocytic colitis    Mitral valve prolapse    Osteoporosis    Reflux     PAST SURGICAL HISTORY:   Past Surgical History:  Procedure Laterality Date   APPENDECTOMY     CARDIAC CATHETERIZATION     CATARACT EXTRACTION     CERVICAL DISC SURGERY     CHOLECYSTECTOMY     CORONARY STENT PLACEMENT     FOOT FRACTURE SURGERY     hemangioma     TONSILLECTOMY     TUBAL LIGATION      SOCIAL HISTORY:   Social History   Tobacco Use   Smoking status: Former    Types: Cigarettes   Smokeless tobacco: Never  Substance Use Topics   Alcohol use: No    FAMILY HISTORY:  Family History  Problem Relation Age of Onset   Breast cancer Paternal Aunt    Diabetes Mother    Hypertension Mother    Heart attack Mother     DRUG ALLERGIES:  Allergies[1]  REVIEW OF SYSTEMS:   ROS As per history of present illness. All pertinent systems were reviewed above. Constitutional, HEENT, cardiovascular, respiratory, GI, GU, musculoskeletal, neuro, psychiatric, endocrine, integumentary and hematologic systems were reviewed and are otherwise negative/unremarkable except for positive findings mentioned above in the HPI.   MEDICATIONS AT HOME:   Prior to Admission medications  Medication Sig Start Date End Date Taking? Authorizing Provider  acetaminophen  (TYLENOL ) 500 MG tablet  Take 500 mg by mouth every 6 (six) hours as needed for mild pain or moderate pain.     [provider]  ALPRAZolam  (XANAX ) 0.25 MG tablet TAKE 1/2 TO 1 TABLET BY MOUTH EVERY 12 HOURS AS NEEDED 07/19/22   [provider]  aspirin  EC 81 MG tablet Take 81 mg by mouth daily.    [provider]  atenolol  (TENORMIN ) 50 MG tablet Take 50 mg by mouth daily. 09/30/16   [provider]  baclofen  (LIORESAL ) 10 MG tablet Take 1 tablet (10 mg total) by mouth 3 (three) times daily. 01/31/21   Bernardino Ditch, NP  calcium  carbonate (TUMS - DOSED IN MG ELEMENTAL CALCIUM ) 500 MG chewable tablet Chew 2 tablets by mouth daily.    [provider]  carvedilol  (COREG ) 6.25 MG tablet Take 1 tablet (6.25 mg total) by mouth 2 (two) times daily. 12/09/17   Laurence Bridegroom, MD  chlorthalidone (HYGROTON) 25 MG tablet Take 12.5 mg by mouth daily. 01/18/21   [provider]  cholecalciferol  (VITAMIN D ) 1000 units tablet Take 2,000 Units by mouth daily.     [provider]  cholestyramine light (PREVALITE) 4 g packet Take by mouth. 08/23/21   [provider]  citalopram  (CELEXA ) 20 MG tablet Take 20 mg by mouth daily.    [provider]  clonazePAM  (KLONOPIN ) 0.5 MG tablet Take 0.5 tablets (0.25 mg total) by mouth 2 (two) times daily as needed for up to 5 days for anxiety. Clonazepam  0.5 mg, 1/2 tab every AM; can take an extra 1/2 tab as needed for acute stress/panic feelings once daily 09/30/23 10/05/23  Ernest Ronal BRAVO, MD  clopidogrel  (PLAVIX ) 75 MG tablet Take 1 tablet by mouth daily. 10/12/15   [provider]  diphenoxylate-atropine (LOMOTIL) 2.5-0.025 MG tablet Take by mouth 4 (four) times daily as needed for diarrhea or loose stools.    [provider]  fluticasone (FLONASE) 50 MCG/ACT nasal spray Place into the nose. 05/26/22 07/24/23  [provider]  isosorbide  dinitrate (ISORDIL ) 30 MG tablet Take 30 mg by mouth 2 (two) times daily.      [provider]  isosorbide  mononitrate (IMDUR ) 30 MG 24 hr tablet Take 30 mg by mouth 2 (two) times daily. 01/20/21   [provider]  labetalol  (NORMODYNE ) 100 MG tablet Take by mouth. 10/12/15   [provider]  lisinopril (ZESTRIL) 20 MG tablet Take 20 mg by mouth daily.    [provider]  Multiple Vitamins-Minerals (CENTRUM PO) Take 1 tablet by mouth daily.     [provider]  nitroGLYCERIN  (NITROSTAT ) 0.4 MG SL tablet Place 0.4 mg under the tongue every 5 (five) minutes as needed for chest pain.    [provider]  potassium chloride  (K-DUR) 10 MEQ tablet Take 1 tablet (10 mEq total)  by mouth 2 (two) times daily for 3 days. 11/25/18 01/31/21  Van Knee, MD  PROLIA 60 MG/ML SOSY injection Inject into the skin. 01/04/21   [provider]  quinapril  (ACCUPRIL ) 20 MG tablet Take 20 mg by mouth daily.     [provider]  traMADol  (ULTRAM ) 50 MG tablet Take 1 tablet (50 mg total) by mouth every 6 (six) hours as needed. 11/16/21   Bernardino Ditch, NP  escitalopram  (LEXAPRO ) 10 MG tablet Take 10 mg by mouth daily.  11/25/18  [provider]      VITAL SIGNS:  Blood pressure (!) 100/57, pulse 69, temperature 98.5 F (36.9 C), temperature source Oral, resp. rate 13, height 5' 2 (1.575 m), weight 79.4 kg, SpO2 100%.  PHYSICAL EXAMINATION:  Physical Exam  GENERAL:  88 y.o.-year-old Caucasian female patient lying in the bed with no acute distress.  EYES: Pupils equal, round, reactive to light and accommodation.  Positive pallor.  No scleral icterus. Extraocular muscles intact.  HEENT: Head atraumatic, normocephalic. Oropharynx and nasopharynx clear.  NECK:  Supple, no jugular venous distention. No thyroid enlargement, no tenderness.  LUNGS: Normal breath sounds bilaterally, no wheezing, rales,rhonchi or crepitation. No use of accessory muscles of respiration.  CARDIOVASCULAR: Regular rate and rhythm, S1, S2 normal. No  murmurs, rubs, or gallops.  ABDOMEN: Soft, nondistended, nontender. Bowel sounds present. No organomegaly or mass.  EXTREMITIES: No pedal edema, cyanosis, or clubbing.  NEUROLOGIC: Cranial nerves II through XII are intact. Muscle strength 5/5 in all extremities. Sensation intact. Gait not checked.  PSYCHIATRIC: The patient is alert and oriented x 3.  Normal affect and good eye contact. SKIN: No obvious rash, lesion, or ulcer.   LABORATORY PANEL:   CBC Recent Labs  Lab 06/03/24 0116  WBC 9.5  HGB 6.6*  HCT 20.6*  PLT 228   ------------------------------------------------------------------------------------------------------------------  Chemistries  Recent Labs  Lab 06/03/24 0116  NA 133*  K 3.2*  CL 99  CO2 22  GLUCOSE 146*  BUN 22  CREATININE 1.13*  CALCIUM  8.9  AST 17  ALT 13  ALKPHOS 59  BILITOT 0.2   ------------------------------------------------------------------------------------------------------------------  Cardiac Enzymes No results for input(s): TROPONINI in the last 168 hours. ------------------------------------------------------------------------------------------------------------------  RADIOLOGY:  DG Chest Portable 1 View Result Date: 06/03/2024 EXAM: 1 VIEW(S) XRAY OF THE CHEST 06/03/2024 01:37:00 AM COMPARISON: 09/30/2023 CLINICAL HISTORY: cough FINDINGS: LUNGS AND PLEURA: Chronic coarsened interstitial markings without pulmonary edema. No focal pulmonary opacity. No pleural effusion. No pneumothorax. HEART AND MEDIASTINUM: Atherosclerotic calcifications. No acute abnormality of the cardiac and mediastinal silhouettes. BONES AND SOFT TISSUES: Severe compression deformity of lower thoracic spine. IMPRESSION: 1. No acute cardiopulmonary abnormality. 2. Chronic coarsened interstitial markings. Electronically signed by: Greig Pique MD 06/03/2024 01:50 AM EST RP Workstation: [id number]      IMPRESSION AND PLAN:  Assessment and Plan: *  Symptomatic anemia - The patient will be admitted to a progressive unit observation bed - She was typed and crossmatch and will be transfused 2 units of packed red blood cells. - Will follow serial hemoglobins and hematocrits and posttransfusion H&H. - Anemia is manifested by generalized weakness and fall as well as dyspnea. - Will obtain anemia workup. - Physical therapy consult to be obtained.  Hypotension - This is likely secondary to volume depletion. - This is associated with diarrhea and fever.  Will therefore obtain abdominal pelvic CT scan to rule out acute colitis. - The patient will be hydrated with IV normal saline. - Antihypertensive therapy will  be held off for hypotension.  Influenza A - The patient will be placed on Tamiflu . - Supportive management will be provided.  Hyponatremia - This likely hypovolemic. - She will be hydrated with IV normal saline and will follow BMP.  Hypokalemia - The patient will be placed on potassium replacement and will follow potassium level. -Will check magnesium  level.  Coronary artery disease - Will continue as needed sublingual nitroglycerin , beta-blocker therapy and Imdur .  Anxiety and depression - Will continue Xanax , Klonopin , Celexa  and doxepin.   DVT prophylaxis: Lovenox .  Advanced Care Planning:  Code Status: full code. Family Communication:  The plan of care was discussed in details with the patient (and family). I answered all questions. The patient agreed to proceed with the above mentioned plan. Further management will depend upon hospital course. Disposition Plan: Back to previous home environment Consults called: none. All the records are reviewed and case discussed with ED provider.  Status is: Observation  I certify that at the time of admission, it is my clinical judgment that the patient will require hospital care extending less than 2 midnights.                            Dispo: The patient is from: Home               Anticipated d/c is to: Home              Patient currently is not medically stable to d/c.              Difficult to place patient: No  Madison DELENA Peaches M.D on 06/03/2024 at 6:40 AM  [facility]   From 7 PM-7 AM, contact night-coverage [email or web address]  CC: Primary care physician; Glover Lenis, MD     [1]  Allergies Allergen Reactions   Codeine Anaphylaxis and Other (See Comments)   Adhesive [Tape]    Diazepam Other (See Comments)    shakes   Ezetimibe Other (See Comments)    Myalgia    "

## 2024-06-03 NOTE — ED Notes (Deleted)
 Jane [name], this patients daughter has called and would like to see if you can call her for update on her mother please. [phone] and her name is [name]. She asked to ask you to please call her asap

## 2024-06-03 NOTE — Progress Notes (Incomplete)
 " PROGRESS NOTE    Jane [name]  FMW:[id number] DOB: Sep 05, 1935 DOA: 06/03/2024 PCP: Glover Lenis, MD  Chief Complaint  Patient presents with   [facility] Hospital Course:  Jane [name] is a 88 y.o. Caucasian female with medical history significant for essential hypertension, dyslipidemia, mitral valve prolapse, osteoarthritis, GERD, lymphocytic colitis and diverticulitis, presented to the emergency room with acute onset of generalized weakness, feeling tired and s/p fall, denies head injury, reports she was weak and her legs gave out.  Also had URI symptoms and had sick contacts.  Had symptoms likely due to influenza A infection.  On presentation was hypotensive, improved s/p IV fluids likely dehydrated.  Found to have influenza A positive, Hb on presentation 6.6, s/p 1 unit PRBC improved to 11.5, suspect initial lab was in error.  Hospital course as below  Subjective: *** Discussed with GI, unlikely GI bleed based on presentation   Objective: Vitals:   06/03/24 0708 06/03/24 0708 06/03/24 0715 06/03/24 0926  BP: 130/71  127/83 (!) 117/99  Pulse: 77  72 80  Resp: 20  18 18   Temp: 98.6 F (37 C)     TempSrc: Oral     SpO2: 100% 100% 100% 98%  Weight:      Height:        Intake/Output Summary (Last 24 hours) at 06/03/2024 0948 Last data filed at 06/03/2024 9288 Gross per 24 hour  Intake 826.83 ml  Output 200 ml  Net 626.83 ml   Filed Weights   06/03/24 0051  Weight: 79.4 kg    Examination:  ***  GENERAL:  88 y.o.-year-old Caucasian female patient lying in the bed with no acute distress.  NECK:  Supple, no jugular venous distention. No thyroid enlargement, no tenderness.  LUNGS: Normal breath sounds bilaterally, no wheezing, rales,rhonchi or crepitation. No use of accessory muscles of respiration.  CARDIOVASCULAR: Regular rate and rhythm, S1, S2 normal. No murmurs, rubs, or gallops.  ABDOMEN: Soft, nondistended, nontender. Bowel sounds present. No  organomegaly or mass.  EXTREMITIES: No pedal edema, cyanosis, or clubbing.  NEUROLOGIC: Cranial nerves II through XII are intact. Muscle strength 5/5 in all extremities. Sensation intact. Gait not checked.  PSYCHIATRIC: The patient is alert and oriented x 3.  Normal affect and good eye contact. SKIN: No obvious rash, lesion, or ulcer  Assessment & Plan:  Macrocytic anemia Hb on presentation 6.6, s/p 1u pRBC improved to 11.5, repeat ~ 11.9 Suspect initial lab on presentation was error Denies hematuria, bright red blood per rectum/melena Reticulocyte level normal, folate normal, Iron panel shows ACD, LDH normal B12 level pending Monitor Hb, appears stable and symptoms on presentation likely due to Influenza  Acute bronchitis Influenza A On Tamiflu  x 5 days Prednisone  40 mg daily, DuoNebs as needed CXR no acute abnormality, no indication for antibiotics Supportive care  Lactic acidosis - resolved likely secondary to volume depletion, resolved s/p IV fluids Reports diarrhea resolved, CT with no evidence of acute colitis Follow blood cultures  Elevated Troponin Troponin downtrended, denies chest pain Likely due to demand ischemia  HTN Was hypotensive on arrival, likely due to dehydration Resume Imdur , Coreg . Resume Lisinopril, Chlorthalidone as tolerated  CKD stage3a Monitor Cr   Mild Hyponatremia - resolved S/p IV fluids Monitor BMP   Hypokalemia Monitor and replete as needed On potassium 10mEq bid at home  Coronary artery disease continue as needed sublingual nitroglycerin , Coreg , Imdur , Plavix    Anxiety and depression continue Xanax , Klonopin ,  Celexa   Obesity class I Body mass index is 32.01 kg/m. - Outpatient follow up for lifestyle modification and risk factor management  Microscopic hematuria Follow-up outpatient for repeat UA with PCP  Generalized weakness S/p fall due to Influenza A infection PT/OT eval  Compression fracture Chart review shows T3  compression fracture with approximately 80% height loss CT with incidental finding L5 compression fracture with vertebral plana, new since 2019 and age-indeterminate Patient reports she has been following up with her PCP and recently got bone scan No new pain, would like to follow-up further with her PCP  DVT prophylaxis: ***   Code Status: Full Code Disposition:  ***  Consultants:    Procedures:  ***  Antimicrobials:  Anti-infectives (From admission, onward)    None       Data Reviewed: I have personally reviewed following labs and imaging studies CBC: Recent Labs  Lab 06/03/24 0116 06/03/24 0500  WBC 9.5 5.9  NEUTROABS 7.6  --   HGB 6.6* 11.5*  HCT 20.6* 34.9*  MCV 105.1* 100.9*  PLT 228 174   Basic Metabolic Panel: Recent Labs  Lab 06/03/24 0116 06/03/24 0500 06/03/24 0537  NA 133* 136  --   K 3.2* 3.6  --   CL 99 103  --   CO2 22 25  --   GLUCOSE 146* 108*  --   BUN 22 19  --   CREATININE 1.13* 1.01*  --   CALCIUM  8.9 9.4  --   MG  --   --  2.0   GFR: Estimated Creatinine Clearance: 39 mL/min (A) (by C-G formula based on SCr of 1.01 mg/dL (H)). Liver Function Tests: Recent Labs  Lab 06/03/24 0116  AST 17  ALT 13  ALKPHOS 59  BILITOT 0.2  PROT 5.3*  ALBUMIN 3.1*   CBG: No results for input(s): GLUCAP in the last 168 hours.  Recent Results (from the past 240 hours)  Resp panel by RT-PCR (RSV, Flu A&B, Covid) Urine, Catheterized     Status: Abnormal   Collection Time: 06/03/24  1:16 AM   Specimen: Urine, Catheterized; Nasal Swab  Result Value Ref Range Status   SARS Coronavirus 2 by RT PCR NEGATIVE NEGATIVE Final    Comment: (NOTE) SARS-CoV-2 target nucleic acids are NOT DETECTED.  The SARS-CoV-2 RNA is generally detectable in upper respiratory specimens during the acute phase of infection. The lowest concentration of SARS-CoV-2 viral copies this assay can detect is 138 copies/mL. A negative result does not preclude  SARS-Cov-2 infection and should not be used as the sole basis for treatment or other patient management decisions. A negative result may occur with  improper specimen collection/handling, submission of specimen other than nasopharyngeal swab, presence of viral mutation(s) within the areas targeted by this assay, and inadequate number of viral copies(<138 copies/mL). A negative result must be combined with clinical observations, patient history, and epidemiological information. The expected result is Negative.  Fact Sheet for Patients:  bloggercourse.com  Fact Sheet for Healthcare Providers:  seriousbroker.it  This test is no t yet approved or cleared by the [place]  FDA and  has been authorized for detection and/or diagnosis of SARS-CoV-2 by FDA under an Emergency Use Authorization (EUA). This EUA will remain  in effect (meaning this test can be used) for the duration of the COVID-19 declaration under Section 564(b)(1) of the Act, 21 U.S.C.section 360bbb-3(b)(1), unless the authorization is terminated  or revoked sooner.       Influenza A by PCR POSITIVE (  A) NEGATIVE Final   Influenza B by PCR NEGATIVE NEGATIVE Final    Comment: (NOTE) The Xpert Xpress SARS-CoV-2/FLU/RSV plus assay is intended as an aid in the diagnosis of influenza from Nasopharyngeal swab specimens and should not be used as a sole basis for treatment. Nasal washings and aspirates are unacceptable for Xpert Xpress SARS-CoV-2/FLU/RSV testing.  Fact Sheet for Patients: bloggercourse.com  Fact Sheet for Healthcare Providers: seriousbroker.it  This test is not yet approved or cleared by the [place]  FDA and has been authorized for detection and/or diagnosis of SARS-CoV-2 by FDA under an Emergency Use Authorization (EUA). This EUA will remain in effect (meaning this test can be used) for the duration of  the COVID-19 declaration under Section 564(b)(1) of the Act, 21 U.S.C. section 360bbb-3(b)(1), unless the authorization is terminated or revoked.     Resp Syncytial Virus by PCR NEGATIVE NEGATIVE Final    Comment: (NOTE) Fact Sheet for Patients: bloggercourse.com  Fact Sheet for Healthcare Providers: seriousbroker.it  This test is not yet approved or cleared by the [place]  FDA and has been authorized for detection and/or diagnosis of SARS-CoV-2 by FDA under an Emergency Use Authorization (EUA). This EUA will remain in effect (meaning this test can be used) for the duration of the COVID-19 declaration under Section 564(b)(1) of the Act, 21 U.S.C. section 360bbb-3(b)(1), unless the authorization is terminated or revoked.  Performed at [facility], [address] [place], [place] [address]   Blood culture (routine x 2)     Status: None (Preliminary result)   Collection Time: 06/03/24  1:16 AM   Specimen: BLOOD  Result Value Ref Range Status   Specimen Description BLOOD RIGHT ANTECUBITAL  Final   Special Requests   Final    BOTTLES DRAWN AEROBIC AND ANAEROBIC Blood Culture adequate volume   Culture   Final    NO GROWTH < 12 HOURS Performed at [facility], [address], [place], [place] [address]    Report Status PENDING  Incomplete  Blood culture (routine x 2)     Status: None (Preliminary result)   Collection Time: 06/03/24  1:16 AM   Specimen: BLOOD  Result Value Ref Range Status   Specimen Description BLOOD LEFT ANTECUBITAL  Final   Special Requests   Final    BOTTLES DRAWN AEROBIC AND ANAEROBIC Blood Culture adequate volume   Culture   Final    NO GROWTH < 12 HOURS Performed at [facility], [address] [place], [place] [address]    Report Status PENDING  Incomplete     Radiology Studies: CT ABDOMEN PELVIS W WO CONTRAST Result Date: 06/03/2024 EXAM: CT ABDOMEN AND PELVIS  WITH AND WITHOUT CONTRAST 06/03/2024 07:49:17 AM TECHNIQUE: CT of the abdomen and pelvis was performed with and without the administration of intravenous contrast. 100 mL of iohexol  (OMNIPAQUE ) 300 MG/ML solution was administered. Multiplanar reformatted images are provided for review. Automated exposure control, iterative reconstruction, and/or weight-based adjustment of the mA/kV was utilized to reduce the radiation dose to as low as reasonably achievable. COMPARISON: CT abdomen and pelvis 06/14/2007. CLINICAL HISTORY: 88 year old female with diarrhea, fever, recent cold-like symptoms, new onset dysuria, foul smelling urine, and a fall. FINDINGS: LOWER CHEST: Mild cardiomegaly. No pericardial effusion. Lung bases are well aerated. No pleural effusion. LIVER: Chronic postoperative changes to the right lobe of the liver are stable. GALLBLADDER AND BILE DUCTS: Chronic cholecystectomy. No biliary ductal dilatation. SPLEEN: No acute abnormality. PANCREAS: No acute abnormality. ADRENAL GLANDS:  No acute abnormality. KIDNEYS, URETERS AND BLADDER: Symmetric renal enhancement and contrast excretion with no obstructive uropathy. Multiple small benign appearing renal cysts with simple fluid density (no follow up imaging recommended). Decompressed ureters. Mildly distended but otherwise unremarkable urinary bladder. Calcified pelvic phleboliths but no urinary calculus identified. GI AND BOWEL: Small gastric hiatal hernia is new or increased since 2019. Otherwise decompressed stomach. Advanced diverticulosis throughout the sigmoid colon in the pelvis, no active inflammation. Moderate diverticulosis of the descending colon and splenic flexure, no active inflammation. Otherwise redundant large bowel with mild to moderate retained stool. Diminutive or absent appendix. No large bowel inflammation. No dilated small bowel. There is no bowel obstruction. PERITONEUM AND RETROPERITONEUM: No ascites. No free air. VASCULATURE: Chronic  moderate to severe calcified atherosclerosis throughout the visible aorta, with moderate to severe stenosis at the aortoiliac bifurcation (series 12 image 24), but major arterial structures in the abdomen and pelvis including the bifurcation remain patent. Portal venous system is patent. LYMPH NODES: No lymphadenopathy. REPRODUCTIVE ORGANS: No acute abnormality. BONES AND SOFT TISSUES: L5 compression fracture with vertebra plana (series 11 image 72) is new since 2019, age indeterminate. Chronic T11 moderate to severe compression and near vertebra plana is stable since 2019 including mild retropulsion. No other acute osseous abnormality identified. Chronic pubic rami fractures were present in 2019. No focal soft tissue abnormality. IMPRESSION: 1. L5 compression fracture with vertebra plana, new since 2019 and age indeterminate. If there is low back pain and if specific therapy such as vertebroplasty is desired, MRI without contrast or a nuclear medicine whole-body bone scan would best determine acuity and candidacy for intervention. 2. No obstructive uropathy, acute or inflammatory process identified in the abdomen or pelvis. 3. Moderate to severe calcified aortoiliac atherosclerosis, with stenosis at the bifurcation which remains patent. Electronically signed by: Helayne Hurst MD 06/03/2024 08:22 AM EST RP Workstation: [id number]   DG Chest Portable 1 View Result Date: 06/03/2024 EXAM: 1 VIEW(S) XRAY OF THE CHEST 06/03/2024 01:37:00 AM COMPARISON: 09/30/2023 CLINICAL HISTORY: cough FINDINGS: LUNGS AND PLEURA: Chronic coarsened interstitial markings without pulmonary edema. No focal pulmonary opacity. No pleural effusion. No pneumothorax. HEART AND MEDIASTINUM: Atherosclerotic calcifications. No acute abnormality of the cardiac and mediastinal silhouettes. BONES AND SOFT TISSUES: Severe compression deformity of lower thoracic spine. IMPRESSION: 1. No acute cardiopulmonary abnormality. 2. Chronic coarsened  interstitial markings. Electronically signed by: Greig Pique MD 06/03/2024 01:50 AM EST RP Workstation: [id number]    Scheduled Meds:  baclofen   10 mg Oral TID   calcium  carbonate  2 tablet Oral Daily   carvedilol   6.25 mg Oral BID   cholecalciferol   2,000 Units Oral Daily   cholestyramine light  4 g Oral BID   citalopram   20 mg Oral Daily   isosorbide  dinitrate  30 mg Oral BID   labetalol   100 mg Oral BID   pantoprazole  (PROTONIX ) IV  40 mg Intravenous Q12H   potassium chloride   10 mEq Oral BID   quinapril   20 mg Oral Daily   Continuous Infusions:  sodium chloride  100 mL/hr at 06/03/24 0616   sodium chloride        LOS: 0 days  MDM: Patient is high risk for one or more organ failure.  They necessitate ongoing hospitalization for continued IV therapies and subsequent lab monitoring. Total time spent interpreting labs and vitals, reviewing the medical record, coordinating care amongst consultants and care team members, directly assessing and discussing care with the patient and/or family: 55 min *** Laree Lock, MD  [facility]  To contact the attending physician between 7A-7P please use Epic Chat. To contact the covering physician during after hours 7P-7A, please review Amion.  06/03/2024, 9:48 AM   *This document has been created with the assistance of dictation software. Please excuse typographical errors. *   "

## 2024-06-03 NOTE — Assessment & Plan Note (Addendum)
-   This is likely secondary to volume depletion. - This is associated with diarrhea and fever.  Will therefore obtain abdominal pelvic CT scan to rule out acute colitis. - The patient will be hydrated with IV normal saline. - Antihypertensive therapy will be held off for hypotension.

## 2024-06-03 NOTE — Assessment & Plan Note (Signed)
-   Will continue as needed sublingual nitroglycerin , beta-blocker therapy and Imdur .

## 2024-06-03 NOTE — Progress Notes (Addendum)
 Patient was admitted for influenza A, anemia with Hb 6.6 which likely was lab error.  Assessment and plan as below Rest of the details per H&P  Macrocytic anemia Hb on presentation 6.6, s/p 1u pRBC improved to 11.5, repeat ~ 11.9 Suspect initial lab on presentation was error Denies hematuria, bright red blood per rectum/melena Reticulocyte level normal, folate normal, Iron panel shows ACD, LDH normal B12 level pending Monitor Hb, appears stable and symptoms on presentation likely due to Influenza   Acute bronchitis Influenza A On Tamiflu  x 5 days Prednisone  40 mg daily, DuoNebs as needed CXR no acute abnormality, no indication for antibiotics Supportive care   Lactic acidosis - resolved likely secondary to volume depletion, resolved s/p IV fluids Reports diarrhea resolved, CT with no evidence of acute colitis Follow blood cultures   Elevated Troponin Troponin downtrended, denies chest pain Likely due to demand ischemia   HTN Was hypotensive on arrival, likely due to dehydration Resume Imdur , Coreg . Resume Lisinopril, Chlorthalidone as tolerated   CKD stage3a Monitor Cr   Mild Hyponatremia - resolved S/p IV fluids Monitor BMP   Hypokalemia Monitor and replete as needed   Coronary artery disease continue as needed sublingual nitroglycerin , Coreg , Imdur , Plavix    Anxiety and depression continue Xanax , Celexa    Obesity class I Body mass index is 32.01 kg/m. - Outpatient follow up for lifestyle modification and risk factor management   Microscopic hematuria Follow-up outpatient for repeat UA with PCP   Generalized weakness S/p fall due to Influenza A infection PT/OT eval   Compression fracture Osteoporosis Chart review shows T3 compression fracture with approximately 80% height loss CT with incidental finding L5 compression fracture with vertebral plana, new since 2019 and age-indeterminate Patient reports she has been following up with her PCP and recently  got DEXA Has chronic back pain, would like to follow-up further with her PCP On Prolia, Calcium  - Vit D supplements, Declined MRI/ further management  -- No charge

## 2024-06-03 NOTE — ED Notes (Signed)
 Pt cath'd with 14 Fr soft tip I&O cath with assistance from [employer].

## 2024-06-03 NOTE — ED Provider Notes (Signed)
 "  [facility] Provider Note    Event Date/Time   First MD Initiated Contact with Patient 06/03/24 [phone]     (approximate)   History   Fall and Weakness   HPI  Jane [name] is a 88 y.o. female who presents to the emergency department today after a fall.  The patient states that she had gotten up to use the restroom.  She then felt both of her legs go out from under her.  She fell onto the ground.  Denies any significant injury from the fall.  Does state over the past few days she has had congestion and cold-like symptoms.  She has been around other people that have also been sick.  Starting yesterday she noticed some dysuria and bad smell to her urine.  She did start taking Azo.  She has had urinary tract infections in the past.     Physical Exam   Triage Vital Signs: ED Triage Vitals  Encounter Vitals Group     BP 06/03/24 0055 (!) 100/52     Girls Systolic BP Percentile --      Girls Diastolic BP Percentile --      Boys Systolic BP Percentile --      Boys Diastolic BP Percentile --      Pulse Rate 06/03/24 0044 78     Resp 06/03/24 0044 19     Temp 06/03/24 0044 99.4 F (37.4 C)     Temp Source 06/03/24 0044 Oral     SpO2 06/03/24 0044 100 %     Weight 06/03/24 0051 175 lb (79.4 kg)     Height 06/03/24 0051 5' 2 (1.575 m)     Head Circumference --      Peak Flow --      Pain Score 06/03/24 0050 8     Pain Loc --      Pain Education --      Exclude from Growth Chart --     Most recent vital signs: Vitals:   06/03/24 0044 06/03/24 0055  BP:  (!) 100/52  Pulse: 78   Resp: 19   Temp: 99.4 F (37.4 C)   SpO2: 100%    General: Awake, alert, oriented. CV:  Good peripheral perfusion. Regular rate and rhythm. Resp:  Normal effort. Diffuse expiratory wheezing, occasional cough. Abd:  No distention.  Rectal:  Brown stool on glove. GUIAC negative.   ED Results / Procedures / Treatments   Labs (all labs ordered are listed, but only  abnormal results are displayed) Labs Reviewed  RESP PANEL BY RT-PCR (RSV, FLU A&B, COVID)  [id number] - Abnormal; Notable for the following components:      Result Value   Influenza A by PCR POSITIVE (*)    All other components within normal limits  CBC WITH DIFFERENTIAL/PLATELET - Abnormal; Notable for the following components:   RBC 1.96 (*)    Hemoglobin 6.6 (*)    HCT 20.6 (*)    MCV 105.1 (*)    Lymphs Abs 0.6 (*)    Monocytes Absolute 1.1 (*)    All other components within normal limits  COMPREHENSIVE METABOLIC PANEL WITH GFR - Abnormal; Notable for the following components:   Sodium 133 (*)    Potassium 3.2 (*)    Glucose, Bld 146 (*)    Creatinine, Ser 1.13 (*)    Total Protein 5.3 (*)    Albumin 3.1 (*)    GFR, Estimated 47 (*)  All other components within normal limits  URINALYSIS, ROUTINE W REFLEX MICROSCOPIC - Abnormal; Notable for the following components:   Color, Urine YELLOW (*)    APPearance CLEAR (*)    Hgb urine dipstick MODERATE (*)    Non Squamous Epithelial PRESENT (*)    All other components within normal limits  LACTIC ACID, PLASMA - Abnormal; Notable for the following components:   Lactic Acid, Venous 2.2 (*)    All other components within normal limits  TROPONIN T, HIGH SENSITIVITY - Abnormal; Notable for the following components:   Troponin T High Sensitivity 38 (*)    All other components within normal limits  TROPONIN T, HIGH SENSITIVITY - Abnormal; Notable for the following components:   Troponin T High Sensitivity 32 (*)    All other components within normal limits  CULTURE, BLOOD (ROUTINE X 2)  CULTURE, BLOOD (ROUTINE X 2)  TYPE AND SCREEN  PREPARE RBC (CROSSMATCH)  ABO/RH     EKG  I, Guadalupe Eagles, attending physician, personally viewed and interpreted this EKG  EKG Time: 0049 Rate: 80 Rhythm: sinus rhythm Axis: normal Intervals: qtc 480 QRS: narrow ST changes: no st elevation Impression: abnormal ekg   RADIOLOGY I  independently interpreted and visualized the CXR. My interpretation: No pneumonia Radiology interpretation:  IMPRESSION:  1. No acute cardiopulmonary abnormality.  2. Chronic coarsened interstitial markings.      PROCEDURES:  Critical Care performed: Yes  CRITICAL CARE Performed by: Guadalupe Eagles   Total critical care time: 35 minutes  Critical care time was exclusive of separately billable procedures and treating other patients.  Critical care was necessary to treat or prevent imminent or life-threatening deterioration.  Critical care was time spent personally by me on the following activities: development of treatment plan with patient and/or surrogate as well as nursing, discussions with consultants, evaluation of patient's response to treatment, examination of patient, obtaining history from patient or surrogate, ordering and performing treatments and interventions, ordering and review of laboratory studies, ordering and review of radiographic studies, pulse oximetry and re-evaluation of patient's condition.   Procedures    MEDICATIONS ORDERED IN ED: Medications - No data to display   IMPRESSION / MDM / ASSESSMENT AND PLAN / ED COURSE  I reviewed the triage vital signs and the nursing notes.                              Differential diagnosis includes, but is not limited to, infection, anemia, ACS, arrhythmia  Patient's presentation is most consistent with acute presentation with potential threat to life or bodily function.   The patient is on the cardiac monitor to evaluate for evidence of arrhythmia and/or significant heart rate changes.  Patient presented to the emergency for fall after her legs when out from under her.  On exam here patient is awake alert.  Nonfocal exam.  States.  Broad workup was initiated.  Blood work notable for some did perform a rectal exam which was Hemoccult negative.  I did discuss low hemoglobin and concern for blood transfusion.   Additionally patient tested positive for the flu.  She did state that she has been having flulike symptoms in the past few days.  This could also be contributing to the process.  Discussed with Dr. Lawence with the hospitalist service who will evaluate for admission.      FINAL CLINICAL IMPRESSION(S) / ED DIAGNOSES   Final diagnoses:  Influenza  Anemia,  unspecified type  Weakness     Note:  This document was prepared using Dragon voice recognition software and may include unintentional dictation errors.    Floy Roberts, MD 06/03/24 [phone]  "

## 2024-06-03 NOTE — Assessment & Plan Note (Signed)
-   The patient will be placed on potassium replacement and will follow potassium level. -Will check magnesium  level.

## 2024-06-03 NOTE — Assessment & Plan Note (Signed)
-   This likely hypovolemic. - She will be hydrated with IV normal saline and will follow BMP.

## 2024-06-03 NOTE — ED Notes (Signed)
Patient set up with dinner tray.

## 2024-06-03 NOTE — ED Triage Notes (Signed)
 Pt here via EMS from home for unwitnessed fall after getting up to got to the restroom. Pt notes my legs just gave out. When asked about pre episodic cardiac symptoms/SOB denies, but notes heart palpitations post. Hx of CHF. EMS notes SBP of 80's and 101.6 F fever. Pt received 1g Tylenol  @ 0028, 500 of LR, and 100 of NS (currently infusing). Pt denies LOC, but notes being on Plavix . Endorse some dysuria, but denies CP/SOB/cough/congestion. Pt A&Ox4.

## 2024-06-03 NOTE — Assessment & Plan Note (Signed)
-   The patient will be placed on Tamiflu . - Supportive management will be provided.

## 2024-06-03 NOTE — ED Notes (Signed)
 Writer went into pt room for rounding. Found that pt at this time had removed her IV from her right hand. RN notified.

## 2024-06-03 NOTE — ED Notes (Signed)
 Pt called me into her room asking who was talking in the corner of the room. The patient thought it was her tv, but the tv was not on. Pt appears restless and fidgety.

## 2024-06-03 NOTE — Assessment & Plan Note (Addendum)
-   The patient will be admitted to a progressive unit observation bed - She was typed and crossmatch and will be transfused 2 units of packed red blood cells. - Will follow serial hemoglobins and hematocrits and posttransfusion H&H. - Anemia is manifested by generalized weakness and fall as well as dyspnea. - Will obtain anemia workup. - Physical therapy consult to be obtained.

## 2024-06-03 NOTE — Assessment & Plan Note (Signed)
-   Will continue Xanax , Klonopin , Celexa  and doxepin.

## 2024-06-03 NOTE — ED Notes (Signed)
 Writer say pt messing with monitoring cord. Writer went into pt room. Pt stated you have a cat sitting at your door, pt also stating I saved your life by killing that huge worm. Pt calm and laughing at this time. RN and Charge notified.

## 2024-06-03 NOTE — ED Notes (Signed)
 Pt repositioned in bed. New absorbent pad placed. Mesh panties placed on pt. Yellow socks placed on pt.

## 2024-06-03 NOTE — ED Notes (Signed)
 Pt eating breakfast tray

## 2024-06-04 DIAGNOSIS — E872 Acidosis, unspecified: Secondary | ICD-10-CM | POA: Diagnosis present

## 2024-06-04 DIAGNOSIS — D649 Anemia, unspecified: Secondary | ICD-10-CM | POA: Diagnosis not present

## 2024-06-04 DIAGNOSIS — E66811 Obesity, class 1: Secondary | ICD-10-CM | POA: Diagnosis present

## 2024-06-04 DIAGNOSIS — E871 Hypo-osmolality and hyponatremia: Secondary | ICD-10-CM | POA: Diagnosis present

## 2024-06-04 DIAGNOSIS — M4856XA Collapsed vertebra, not elsewhere classified, lumbar region, initial encounter for fracture: Secondary | ICD-10-CM | POA: Diagnosis present

## 2024-06-04 DIAGNOSIS — F419 Anxiety disorder, unspecified: Secondary | ICD-10-CM | POA: Diagnosis present

## 2024-06-04 DIAGNOSIS — E876 Hypokalemia: Secondary | ICD-10-CM | POA: Diagnosis present

## 2024-06-04 DIAGNOSIS — Y92013 Bedroom of single-family (private) house as the place of occurrence of the external cause: Secondary | ICD-10-CM | POA: Diagnosis not present

## 2024-06-04 DIAGNOSIS — E1122 Type 2 diabetes mellitus with diabetic chronic kidney disease: Secondary | ICD-10-CM | POA: Diagnosis present

## 2024-06-04 DIAGNOSIS — E785 Hyperlipidemia, unspecified: Secondary | ICD-10-CM | POA: Diagnosis present

## 2024-06-04 DIAGNOSIS — M549 Dorsalgia, unspecified: Secondary | ICD-10-CM | POA: Diagnosis present

## 2024-06-04 DIAGNOSIS — R3129 Other microscopic hematuria: Secondary | ICD-10-CM | POA: Diagnosis present

## 2024-06-04 DIAGNOSIS — G8929 Other chronic pain: Secondary | ICD-10-CM | POA: Diagnosis present

## 2024-06-04 DIAGNOSIS — M4854XA Collapsed vertebra, not elsewhere classified, thoracic region, initial encounter for fracture: Secondary | ICD-10-CM | POA: Diagnosis present

## 2024-06-04 DIAGNOSIS — I2510 Atherosclerotic heart disease of native coronary artery without angina pectoris: Secondary | ICD-10-CM | POA: Diagnosis present

## 2024-06-04 DIAGNOSIS — E860 Dehydration: Secondary | ICD-10-CM | POA: Diagnosis present

## 2024-06-04 DIAGNOSIS — Z1152 Encounter for screening for COVID-19: Secondary | ICD-10-CM | POA: Diagnosis not present

## 2024-06-04 DIAGNOSIS — I959 Hypotension, unspecified: Secondary | ICD-10-CM | POA: Diagnosis present

## 2024-06-04 DIAGNOSIS — Z7902 Long term (current) use of antithrombotics/antiplatelets: Secondary | ICD-10-CM | POA: Diagnosis not present

## 2024-06-04 DIAGNOSIS — W1830XA Fall on same level, unspecified, initial encounter: Secondary | ICD-10-CM | POA: Diagnosis present

## 2024-06-04 DIAGNOSIS — D539 Nutritional anemia, unspecified: Secondary | ICD-10-CM | POA: Diagnosis present

## 2024-06-04 DIAGNOSIS — I2489 Other forms of acute ischemic heart disease: Secondary | ICD-10-CM | POA: Diagnosis present

## 2024-06-04 DIAGNOSIS — R531 Weakness: Secondary | ICD-10-CM | POA: Diagnosis present

## 2024-06-04 DIAGNOSIS — N1831 Chronic kidney disease, stage 3a: Secondary | ICD-10-CM | POA: Diagnosis present

## 2024-06-04 DIAGNOSIS — I130 Hypertensive heart and chronic kidney disease with heart failure and stage 1 through stage 4 chronic kidney disease, or unspecified chronic kidney disease: Secondary | ICD-10-CM | POA: Diagnosis present

## 2024-06-04 DIAGNOSIS — J209 Acute bronchitis, unspecified: Secondary | ICD-10-CM | POA: Diagnosis present

## 2024-06-04 DIAGNOSIS — J101 Influenza due to other identified influenza virus with other respiratory manifestations: Secondary | ICD-10-CM | POA: Diagnosis present

## 2024-06-04 DIAGNOSIS — I5032 Chronic diastolic (congestive) heart failure: Secondary | ICD-10-CM | POA: Diagnosis present

## 2024-06-04 LAB — BLOOD CULTURE ID PANEL (REFLEXED) - BCID2

## 2024-06-04 LAB — BASIC METABOLIC PANEL WITH GFR
Anion gap: 12 (ref 5–15)
BUN: 17 mg/dL (ref 8–23)
CO2: 23 mmol/L (ref 22–32)
Calcium: 9.6 mg/dL (ref 8.9–10.3)
Chloride: 100 mmol/L (ref 98–111)
Creatinine, Ser: 0.95 mg/dL (ref 0.44–1.00)
GFR, Estimated: 58 mL/min — ABNORMAL LOW
Glucose, Bld: 99 mg/dL (ref 70–99)
Potassium: 4.2 mmol/L (ref 3.5–5.1)
Sodium: 136 mmol/L (ref 135–145)

## 2024-06-04 LAB — TYPE AND SCREEN
ABO/RH(D): A POS
Antibody Screen: NEGATIVE
Unit division: 0
Unit division: 0

## 2024-06-04 LAB — CBC
HCT: 37.5 % (ref 36.0–46.0)
Hemoglobin: 12.7 g/dL (ref 12.0–15.0)
MCH: 32.6 pg (ref 26.0–34.0)
MCHC: 33.9 g/dL (ref 30.0–36.0)
MCV: 96.4 fL (ref 80.0–100.0)
Platelets: 169 K/uL (ref 150–400)
RBC: 3.89 MIL/uL (ref 3.87–5.11)
RDW: 13.5 % (ref 11.5–15.5)
WBC: 7.8 K/uL (ref 4.0–10.5)
nRBC: 0 % (ref 0.0–0.2)

## 2024-06-04 LAB — TROPONIN T, HIGH SENSITIVITY
Troponin T High Sensitivity: 31 ng/L — ABNORMAL HIGH (ref 0–19)
Troponin T High Sensitivity: 38 ng/L — ABNORMAL HIGH (ref 0–19)
Troponin T High Sensitivity: 40 ng/L — ABNORMAL HIGH (ref 0–19)

## 2024-06-04 LAB — MAGNESIUM: Magnesium: 1.9 mg/dL (ref 1.7–2.4)

## 2024-06-04 LAB — PRO BRAIN NATRIURETIC PEPTIDE: Pro Brain Natriuretic Peptide: 4200 pg/mL — ABNORMAL HIGH

## 2024-06-04 LAB — BPAM RBC
Blood Product Expiration Date: 202601302359
Blood Product Expiration Date: 202601302359
ISSUE DATE / TIME: 202512290547
Unit Type and Rh: 6200
Unit Type and Rh: 6200

## 2024-06-04 LAB — CBG MONITORING, ED
Glucose-Capillary: 112 mg/dL — ABNORMAL HIGH (ref 70–99)
Glucose-Capillary: 122 mg/dL — ABNORMAL HIGH (ref 70–99)

## 2024-06-04 LAB — PREPARE RBC (CROSSMATCH)

## 2024-06-04 MED ORDER — OLANZAPINE 10 MG IM SOLR
5.0000 mg | Freq: Once | INTRAMUSCULAR | Status: AC | PRN
  Administered 2024-06-04: 5 mg via INTRAMUSCULAR
  Filled 2024-06-04: qty 10

## 2024-06-04 MED ORDER — GUAIFENESIN-DM 100-10 MG/5ML PO SYRP
5.0000 mL | ORAL_SOLUTION | ORAL | Status: DC | PRN
  Administered 2024-06-04 – 2024-06-06 (×4): 5 mL via ORAL
  Filled 2024-06-04 (×4): qty 10

## 2024-06-04 MED ORDER — OLANZAPINE 10 MG IM SOLR: 5.0000 mg | Freq: Once | INTRAMUSCULAR | Status: DC | PRN

## 2024-06-04 MED ORDER — FUROSEMIDE 10 MG/ML IJ SOLN
40.0000 mg | Freq: Once | INTRAMUSCULAR | Status: AC
  Administered 2024-06-04: 40 mg via INTRAVENOUS
  Filled 2024-06-04: qty 4

## 2024-06-04 MED ORDER — CYANOCOBALAMIN 1000 MCG/ML IJ SOLN
1000.0000 ug | Freq: Once | INTRAMUSCULAR | Status: AC
  Administered 2024-06-04: 1000 ug via INTRAMUSCULAR
  Filled 2024-06-04: qty 1

## 2024-06-04 MED ORDER — ENOXAPARIN SODIUM 40 MG/0.4ML IJ SOSY
40.0000 mg | PREFILLED_SYRINGE | INTRAMUSCULAR | Status: DC
  Administered 2024-06-05: 40 mg via SUBCUTANEOUS
  Filled 2024-06-04: qty 0.4

## 2024-06-04 MED ORDER — LISINOPRIL 10 MG PO TABS
10.0000 mg | ORAL_TABLET | Freq: Every day | ORAL | Status: DC
  Administered 2024-06-05: 10 mg via ORAL
  Filled 2024-06-04: qty 1

## 2024-06-04 MED ORDER — HYDROMORPHONE HCL 1 MG/ML IJ SOLN
1.0000 mg | Freq: Once | INTRAMUSCULAR | Status: DC
  Filled 2024-06-04: qty 1

## 2024-06-04 NOTE — Progress Notes (Signed)
" ° °  Brief Progress Note   _____________________________________________________________________________________________________________  Patient Name: Jane [name] Patient DOB: [birth year] Date: @TODAY @      Data: Reviewed labs, VS, notes, event today.      Action: No action needed at this time.      Response:    _____________________________________________________________________________________________________________  [facility] RN [facility] Sharolyn JONETTA Batman Please contact us  directly via secure chat (search for [facility]) or by calling us  at [phone] [facility]).  "

## 2024-06-04 NOTE — ED Notes (Signed)
 Pt didn't eat breakfast, states that she's not hungry.

## 2024-06-04 NOTE — ED Notes (Signed)
 Per request by pt, this RN has attempted twice to call pt's husband to update him but pt's husband has not answered.

## 2024-06-04 NOTE — Plan of Care (Addendum)
 Patient has sitter, but is aimlessly wandering the halls with a walker. Took 2 people to even give her lasix  through IV but is refusing ALL care and PO (meds,food and drink).  Lab tried to pull trop but patient is refusing currently.  Hosp informed of situation and placing PRN order to hopefully be given 8P or later to aim to assist with sleep (then, hopefully better tomorrow).   Son also called and spoke to mom over the phone, causing temporary alertness.  Son unable to come to the hospital since he has LVAD in place and can't risk getting sick.

## 2024-06-04 NOTE — ED Notes (Signed)
 Pt refusing to eat lunch or drink any fluids. Continues to be confused and asking for her husband.

## 2024-06-04 NOTE — ED Notes (Addendum)
 Jane [name], EDT, stated that pt lost consciousness. This RN went into room to assess pt. Pt alert and attempting to get out of bed. Ponnala MD notified. EKG performed, CBG and vitals taken per Jerelene MD. Jerelene MD in room to assess pt and EKG given to MD. Per MD, nitroglycerin  PRN needed. Troponin ordered per MD. Pt complaining of CP. This RN brought in nitroglycerin  but it is refused by pt d/t it not being the nitroglycerin  out of her cabinet at home. Pt educated about importance of taking the nitroglycerin . Ponnala MD notified of pt refusal.

## 2024-06-04 NOTE — ED Notes (Incomplete)
 Pt verbally and physically aggressive towards this RN and Brittany, EDT. Pt keeps stating for this RN and Brittany EDT to get out of her house. Pt educated about being a high fall risk, but pt keeps trying to get out of bed. Pt eventually reassured enough to get into bed.

## 2024-06-04 NOTE — Progress Notes (Signed)
 PHARMACY - PHYSICIAN COMMUNICATION CRITICAL VALUE ALERT - BLOOD CULTURE IDENTIFICATION (BCID)  Jane [name] is an 88 y.o. female who presented to [facility] on 06/03/2024 with a chief complaint of flu, PNA.   Assessment:  Staph epi in 1 of 4 bottles, no resistance detected.  Most likely a contaminant.  Pt is Type A Flu positive on PCR.  (include suspected source if known)  Name of physician (or Provider) Contacted: Mansy   Current antibiotics: none but on Tamiflu  for flu tx   Changes to prescribed antibiotics recommended:  Patient is on recommended antibiotics - No changes needed  Results for orders placed or performed during the hospital encounter of 06/03/24  Blood Culture ID Panel (Reflexed) (Collected: 06/03/2024  1:16 AM)  Result Value Ref Range   Enterococcus faecalis NOT DETECTED NOT DETECTED   Enterococcus Faecium NOT DETECTED NOT DETECTED   Listeria monocytogenes NOT DETECTED NOT DETECTED   Staphylococcus species DETECTED (A) NOT DETECTED   Staphylococcus aureus (BCID) NOT DETECTED NOT DETECTED   Staphylococcus epidermidis DETECTED (A) NOT DETECTED   Staphylococcus lugdunensis NOT DETECTED NOT DETECTED   Streptococcus species NOT DETECTED NOT DETECTED   Streptococcus agalactiae NOT DETECTED NOT DETECTED   Streptococcus pneumoniae NOT DETECTED NOT DETECTED   Streptococcus pyogenes NOT DETECTED NOT DETECTED   A.calcoaceticus-baumannii NOT DETECTED NOT DETECTED   Bacteroides fragilis NOT DETECTED NOT DETECTED   Enterobacterales NOT DETECTED NOT DETECTED   Enterobacter cloacae complex NOT DETECTED NOT DETECTED   Escherichia coli NOT DETECTED NOT DETECTED   Klebsiella aerogenes NOT DETECTED NOT DETECTED   Klebsiella oxytoca NOT DETECTED NOT DETECTED   Klebsiella pneumoniae NOT DETECTED NOT DETECTED   Proteus species NOT DETECTED NOT DETECTED   Salmonella species NOT DETECTED NOT DETECTED   Serratia marcescens NOT DETECTED NOT DETECTED   Haemophilus influenzae NOT  DETECTED NOT DETECTED   Neisseria meningitidis NOT DETECTED NOT DETECTED   Pseudomonas aeruginosa NOT DETECTED NOT DETECTED   Stenotrophomonas maltophilia NOT DETECTED NOT DETECTED   Candida albicans NOT DETECTED NOT DETECTED   Candida auris NOT DETECTED NOT DETECTED   Candida glabrata NOT DETECTED NOT DETECTED   Candida krusei NOT DETECTED NOT DETECTED   Candida parapsilosis NOT DETECTED NOT DETECTED   Candida tropicalis NOT DETECTED NOT DETECTED   Cryptococcus neoformans/gattii NOT DETECTED NOT DETECTED   Methicillin resistance mecA/C NOT DETECTED NOT DETECTED    Eliaz Fout D 06/04/2024  12:50 AM

## 2024-06-04 NOTE — Progress Notes (Addendum)
 " PROGRESS NOTE    Jane [name]  FMW:[id number] DOB: June 11, 1935 DOA: 06/03/2024 PCP: Glover Lenis, MD  Chief Complaint  Patient presents with   [facility] Course:  Jane [name] is a 88 y.o. Caucasian female with medical history significant for essential hypertension, dyslipidemia, mitral valve prolapse, osteoarthritis, GERD, lymphocytic colitis and diverticulitis, presented to the emergency room with acute onset of generalized weakness, feeling tired and s/p fall, denies head injury, reports she was weak and her legs gave out.  Also had URI symptoms and had sick contacts.  Had symptoms likely due to influenza A infection.  On presentation was hypotensive, improved s/p IV fluids likely dehydrated.  Found to have influenza A positive, Hb on presentation 6.6, s/p 1 unit PRBC improved to 11.5, suspect initial lab was in error.  Hospital course as below  Subjective: Patient was examined at the bedside, has been agitated and confused since last night. Has one-to-one sitter at the bedside, reported brief episode of passing out.  Suspect vasovagal syncope? EKG no changes, reports mild chest pain -pending repeat troponin -also patient has been confused, refused nitroglycerin  States she is at [employer] spouse Christopher, did not answer, left voicemail   Objective: Vitals:   06/04/24 0836 06/04/24 1251 06/04/24 1540 06/04/24 1650  BP: (!) 186/86  (!) 179/96   Pulse: 77  81   Resp: 20  20   Temp: 98 F (36.7 C) 98 F (36.7 C) 98 F (36.7 C)   TempSrc: Oral Oral Oral   SpO2: 95%  93%   Weight:    69 kg  Height:        Intake/Output Summary (Last 24 hours) at 06/04/2024 1727 Last data filed at 06/04/2024 1445 Gross per 24 hour  Intake --  Output 900 ml  Net -900 ml   Filed Weights   06/03/24 0051 06/04/24 1650  Weight: 79.4 kg 69 kg    Examination: GENERAL:  88 y.o.-year-old Caucasian female patient lying in the bed with no acute distress   NECK:  Supple, no jugular venous distention LUNGS: crackles b/l bases, no wheezing, rales,rhonchi or crepitation CARDIOVASCULAR: Regular rate and rhythm, S1, S2 normal ABDOMEN: Soft, nondistended, nontender. Bowel sounds present EXTREMITIES: No pedal edema, cyanosis, or clubbing NEUROLOGIC: AO x2, Pleasantly confused, no gross focal deficitsr  Assessment & Plan:  Macrocytic anemia Hb on presentation 6.6, s/p 1u pRBC improved to 11.5, repeat ~ 11.9 Suspect initial lab on presentation was error Denies hematuria, bright red blood per rectum/melena Reticulocyte level normal, folate normal, Iron panel shows ACD, LDH normal B12 level low normal, B12 1000 mcg IM x weekly FOBT pending Monitor Hb, appears stable and symptoms on presentation likely due to Influenza  Acute bronchitis Influenza A On Tamiflu  x 5 days DuoNebs as needed.  Discontinued prednisone  due to confusion CXR no acute abnormality, no indication for antibiotics Supportive care  Delirium Suspect likely hospital delirium, sundowning Reports brief period of passing out ? Vasovagal CXR negative, UA no evidence of UTI Hold Xanax .  Discontinue prednisone  Delirium precaution, has 1:1 sitter  Chronic HFpEF Patient reports history of heart failure S/p IV fluids on admission, has bilateral crackles IV Lasix  40 mg x 1 dose  Lactic acidosis - resolved likely secondary to volume depletion, resolved s/p IV fluids Reports diarrhea resolved, CT with no evidence of acute colitis Blood culture 1/2: Staph epidermidis, likely contaminant  Elevated Troponin Troponin downtrended, denies chest pain Likely due to demand  ischemia  HTN Was hypotensive on arrival, likely due to dehydration On Imdur , Coreg . Resume Lisinopril. Hold Chlorthalidone  CKD stage3a Monitor Cr   Mild Hyponatremia - resolved S/p IV fluids Monitor BMP   Hypokalemia Monitor and replete as needed On potassium 10mEq bid at home  Coronary artery  disease continue as needed sublingual nitroglycerin , Coreg , Imdur , Plavix    Anxiety and depression continue Celexa   Obesity class I Body mass index is 27.84 kg/m. - Outpatient follow up for lifestyle modification and risk factor management  Microscopic hematuria Follow-up outpatient for repeat UA with PCP  Generalized weakness S/p fall due to Influenza A infection PT/OT rec SNF  Compression fracture Chart review shows T3 compression fracture with approximately 80% height loss CT with incidental finding L5 compression fracture with vertebral plana, new since 2019 and age-indeterminate Reported she has been following up with her PCP and recently got bone scan No new pain, would like to follow-up further with her PCP  DVT prophylaxis: Lovenox  SQ   Code Status: Full Code Disposition:  SNF  Consultants:  None  Procedures:  None  Antimicrobials:  Anti-infectives (From admission, onward)    Start     Dose/Rate Route Frequency Ordered Stop   06/03/24 2200  oseltamivir  (TAMIFLU ) capsule 30 mg        30 mg Oral 2 times daily 06/03/24 1318 06/08/24 0959   06/03/24 1330  oseltamivir  (TAMIFLU ) capsule 75 mg        75 mg Oral  Once 06/03/24 1315 06/03/24 1436       Data Reviewed: I have personally reviewed following labs and imaging studies CBC: Recent Labs  Lab 06/03/24 0116 06/03/24 0500 06/03/24 1133 06/03/24 1618 06/04/24 1159  WBC 9.5 5.9  --   --  7.8  NEUTROABS 7.6  --   --   --   --   HGB 6.6* 11.5* 11.6* 11.9* 12.7  HCT 20.6* 34.9* 35.8* 37.0 37.5  MCV 105.1* 100.9*  --   --  96.4  PLT 228 174  --   --  169   Basic Metabolic Panel: Recent Labs  Lab 06/03/24 0116 06/03/24 0500 06/03/24 0537 06/04/24 1048  NA 133* 136  --  136  K 3.2* 3.6  --  4.2  CL 99 103  --  100  CO2 22 25  --  23  GLUCOSE 146* 108*  --  99  BUN 22 19  --  17  CREATININE 1.13* 1.01*  --  0.95  CALCIUM  8.9 9.4  --  9.6  MG  --   --  2.0 1.9   GFR: Estimated Creatinine  Clearance: 38.7 mL/min (by C-G formula based on SCr of 0.95 mg/dL). Liver Function Tests: Recent Labs  Lab 06/03/24 0116  AST 17  ALT 13  ALKPHOS 59  BILITOT 0.2  PROT 5.3*  ALBUMIN 3.1*   CBG: Recent Labs  Lab 06/04/24 0622 06/04/24 1445  GLUCAP 112* 122*    Recent Results (from the past 240 hours)  Resp panel by RT-PCR (RSV, Flu A&B, Covid) Urine, Catheterized     Status: Abnormal   Collection Time: 06/03/24  1:16 AM   Specimen: Urine, Catheterized; Nasal Swab  Result Value Ref Range Status   SARS Coronavirus 2 by RT PCR NEGATIVE NEGATIVE Final    Comment: (NOTE) SARS-CoV-2 target nucleic acids are NOT DETECTED.  The SARS-CoV-2 RNA is generally detectable in upper respiratory specimens during the acute phase of infection. The lowest concentration of SARS-CoV-2 viral  copies this assay can detect is 138 copies/mL. A negative result does not preclude SARS-Cov-2 infection and should not be used as the sole basis for treatment or other patient management decisions. A negative result may occur with  improper specimen collection/handling, submission of specimen other than nasopharyngeal swab, presence of viral mutation(s) within the areas targeted by this assay, and inadequate number of viral copies(<138 copies/mL). A negative result must be combined with clinical observations, patient history, and epidemiological information. The expected result is Negative.  Fact Sheet for Patients:  bloggercourse.com  Fact Sheet for Healthcare Providers:  seriousbroker.it  This test is no t yet approved or cleared by the [place]  FDA and  has been authorized for detection and/or diagnosis of SARS-CoV-2 by FDA under an Emergency Use Authorization (EUA). This EUA will remain  in effect (meaning this test can be used) for the duration of the COVID-19 declaration under Section 564(b)(1) of the Act, 21 U.S.C.section 360bbb-3(b)(1),  unless the authorization is terminated  or revoked sooner.       Influenza A by PCR POSITIVE (A) NEGATIVE Final   Influenza B by PCR NEGATIVE NEGATIVE Final    Comment: (NOTE) The Xpert Xpress SARS-CoV-2/FLU/RSV plus assay is intended as an aid in the diagnosis of influenza from Nasopharyngeal swab specimens and should not be used as a sole basis for treatment. Nasal washings and aspirates are unacceptable for Xpert Xpress SARS-CoV-2/FLU/RSV testing.  Fact Sheet for Patients: bloggercourse.com  Fact Sheet for Healthcare Providers: seriousbroker.it  This test is not yet approved or cleared by the [place]  FDA and has been authorized for detection and/or diagnosis of SARS-CoV-2 by FDA under an Emergency Use Authorization (EUA). This EUA will remain in effect (meaning this test can be used) for the duration of the COVID-19 declaration under Section 564(b)(1) of the Act, 21 U.S.C. section 360bbb-3(b)(1), unless the authorization is terminated or revoked.     Resp Syncytial Virus by PCR NEGATIVE NEGATIVE Final    Comment: (NOTE) Fact Sheet for Patients: bloggercourse.com  Fact Sheet for Healthcare Providers: seriousbroker.it  This test is not yet approved or cleared by the [place]  FDA and has been authorized for detection and/or diagnosis of SARS-CoV-2 by FDA under an Emergency Use Authorization (EUA). This EUA will remain in effect (meaning this test can be used) for the duration of the COVID-19 declaration under Section 564(b)(1) of the Act, 21 U.S.C. section 360bbb-3(b)(1), unless the authorization is terminated or revoked.  Performed at [facility], [address] [place], [place] [address]   Blood culture (routine x 2)     Status: None (Preliminary result)   Collection Time: 06/03/24  1:16 AM   Specimen: BLOOD  Result Value Ref Range Status    Specimen Description   Final    BLOOD RIGHT ANTECUBITAL Performed at [facility], [address], [place], [place] [address]    Special Requests   Final    BOTTLES DRAWN AEROBIC AND ANAEROBIC Blood Culture adequate volume Performed at [facility], [address], [place], [place] [address]    Culture  Setup Time   Final    GRAM POSITIVE COCCI IN BOTH AEROBIC AND ANAEROBIC BOTTLES CRITICAL RESULT CALLED TO, READ BACK BY AND VERIFIED WITH:  JASON ROBBINS AT 0040 06/04/24 JG Performed at [facility], [address] [place], [place] [address]    Culture GRAM POSITIVE COCCI  Final   Report Status PENDING  Incomplete  Blood culture (routine x 2)  Status: None (Preliminary result)   Collection Time: 06/03/24  1:16 AM   Specimen: BLOOD  Result Value Ref Range Status   Specimen Description BLOOD LEFT ANTECUBITAL  Final   Special Requests   Final    BOTTLES DRAWN AEROBIC AND ANAEROBIC Blood Culture adequate volume   Culture   Final    NO GROWTH 1 DAY Performed at [facility], [address] [place], [place] [address]    Report Status PENDING  Incomplete  Blood Culture ID Panel (Reflexed)     Status: Abnormal   Collection Time: 06/03/24  1:16 AM  Result Value Ref Range Status   Enterococcus faecalis NOT DETECTED NOT DETECTED Final   Enterococcus Faecium NOT DETECTED NOT DETECTED Final   Listeria monocytogenes NOT DETECTED NOT DETECTED Final   Staphylococcus species DETECTED (A) NOT DETECTED Final    Comment: CRITICAL RESULT CALLED TO, READ BACK BY AND VERIFIED WITH:  JASON ROBBINS AT 0040 06/04/24 JG    Staphylococcus aureus (BCID) NOT DETECTED NOT DETECTED Final   Staphylococcus epidermidis DETECTED (A) NOT DETECTED Final    Comment: CRITICAL RESULT CALLED TO, READ BACK BY AND VERIFIED WITH:  JASON ROBBINS AT 0040 06/04/24 JG    Staphylococcus lugdunensis NOT DETECTED NOT DETECTED Final   Streptococcus species NOT DETECTED NOT DETECTED  Final   Streptococcus agalactiae NOT DETECTED NOT DETECTED Final   Streptococcus pneumoniae NOT DETECTED NOT DETECTED Final   Streptococcus pyogenes NOT DETECTED NOT DETECTED Final   A.calcoaceticus-baumannii NOT DETECTED NOT DETECTED Final   Bacteroides fragilis NOT DETECTED NOT DETECTED Final   Enterobacterales NOT DETECTED NOT DETECTED Final   Enterobacter cloacae complex NOT DETECTED NOT DETECTED Final   Escherichia coli NOT DETECTED NOT DETECTED Final   Klebsiella aerogenes NOT DETECTED NOT DETECTED Final   Klebsiella oxytoca NOT DETECTED NOT DETECTED Final   Klebsiella pneumoniae NOT DETECTED NOT DETECTED Final   Proteus species NOT DETECTED NOT DETECTED Final   Salmonella species NOT DETECTED NOT DETECTED Final   Serratia marcescens NOT DETECTED NOT DETECTED Final   Haemophilus influenzae NOT DETECTED NOT DETECTED Final   Neisseria meningitidis NOT DETECTED NOT DETECTED Final   Pseudomonas aeruginosa NOT DETECTED NOT DETECTED Final   Stenotrophomonas maltophilia NOT DETECTED NOT DETECTED Final   Candida albicans NOT DETECTED NOT DETECTED Final   Candida auris NOT DETECTED NOT DETECTED Final   Candida glabrata NOT DETECTED NOT DETECTED Final   Candida krusei NOT DETECTED NOT DETECTED Final   Candida parapsilosis NOT DETECTED NOT DETECTED Final   Candida tropicalis NOT DETECTED NOT DETECTED Final   Cryptococcus neoformans/gattii NOT DETECTED NOT DETECTED Final   Methicillin resistance mecA/C NOT DETECTED NOT DETECTED Final    Comment: Performed at [facility], [address] [place], [place] [address]     Radiology Studies: CT ABDOMEN PELVIS W WO CONTRAST Result Date: 06/03/2024 EXAM: CT ABDOMEN AND PELVIS WITH AND WITHOUT CONTRAST 06/03/2024 07:49:17 AM TECHNIQUE: CT of the abdomen and pelvis was performed with and without the administration of intravenous contrast. 100 mL of iohexol  (OMNIPAQUE ) 300 MG/ML solution was administered. Multiplanar reformatted images are  provided for review. Automated exposure control, iterative reconstruction, and/or weight-based adjustment of the mA/kV was utilized to reduce the radiation dose to as low as reasonably achievable. COMPARISON: CT abdomen and pelvis 06/14/2007. CLINICAL HISTORY: 88 year old female with diarrhea, fever, recent cold-like symptoms, new onset dysuria, foul smelling urine, and a fall. FINDINGS: LOWER CHEST: Mild cardiomegaly. No pericardial effusion. Lung bases are well  aerated. No pleural effusion. LIVER: Chronic postoperative changes to the right lobe of the liver are stable. GALLBLADDER AND BILE DUCTS: Chronic cholecystectomy. No biliary ductal dilatation. SPLEEN: No acute abnormality. PANCREAS: No acute abnormality. ADRENAL GLANDS: No acute abnormality. KIDNEYS, URETERS AND BLADDER: Symmetric renal enhancement and contrast excretion with no obstructive uropathy. Multiple small benign appearing renal cysts with simple fluid density (no follow up imaging recommended). Decompressed ureters. Mildly distended but otherwise unremarkable urinary bladder. Calcified pelvic phleboliths but no urinary calculus identified. GI AND BOWEL: Small gastric hiatal hernia is new or increased since 2019. Otherwise decompressed stomach. Advanced diverticulosis throughout the sigmoid colon in the pelvis, no active inflammation. Moderate diverticulosis of the descending colon and splenic flexure, no active inflammation. Otherwise redundant large bowel with mild to moderate retained stool. Diminutive or absent appendix. No large bowel inflammation. No dilated small bowel. There is no bowel obstruction. PERITONEUM AND RETROPERITONEUM: No ascites. No free air. VASCULATURE: Chronic moderate to severe calcified atherosclerosis throughout the visible aorta, with moderate to severe stenosis at the aortoiliac bifurcation (series 12 image 24), but major arterial structures in the abdomen and pelvis including the bifurcation remain patent. Portal  venous system is patent. LYMPH NODES: No lymphadenopathy. REPRODUCTIVE ORGANS: No acute abnormality. BONES AND SOFT TISSUES: L5 compression fracture with vertebra plana (series 11 image 72) is new since 2019, age indeterminate. Chronic T11 moderate to severe compression and near vertebra plana is stable since 2019 including mild retropulsion. No other acute osseous abnormality identified. Chronic pubic rami fractures were present in 2019. No focal soft tissue abnormality. IMPRESSION: 1. L5 compression fracture with vertebra plana, new since 2019 and age indeterminate. If there is low back pain and if specific therapy such as vertebroplasty is desired, MRI without contrast or a nuclear medicine whole-body bone scan would best determine acuity and candidacy for intervention. 2. No obstructive uropathy, acute or inflammatory process identified in the abdomen or pelvis. 3. Moderate to severe calcified aortoiliac atherosclerosis, with stenosis at the bifurcation which remains patent. Electronically signed by: Helayne Hurst MD 06/03/2024 08:22 AM EST RP Workstation: [id number]   DG Chest Portable 1 View Result Date: 06/03/2024 EXAM: 1 VIEW(S) XRAY OF THE CHEST 06/03/2024 01:37:00 AM COMPARISON: 09/30/2023 CLINICAL HISTORY: cough FINDINGS: LUNGS AND PLEURA: Chronic coarsened interstitial markings without pulmonary edema. No focal pulmonary opacity. No pleural effusion. No pneumothorax. HEART AND MEDIASTINUM: Atherosclerotic calcifications. No acute abnormality of the cardiac and mediastinal silhouettes. BONES AND SOFT TISSUES: Severe compression deformity of lower thoracic spine. IMPRESSION: 1. No acute cardiopulmonary abnormality. 2. Chronic coarsened interstitial markings. Electronically signed by: Greig Pique MD 06/03/2024 01:50 AM EST RP Workstation: [id number]    Scheduled Meds:  carvedilol   6.25 mg Oral BID   cholecalciferol   2,000 Units Oral Daily   citalopram   20 mg Oral Daily   clopidogrel   75 mg Oral  Daily   furosemide   40 mg Intravenous Once   isosorbide  mononitrate  30 mg Oral BID   oseltamivir   30 mg Oral BID   pantoprazole   40 mg Oral Daily   Continuous Infusions:     LOS: 0 days  MDM: Patient is high risk for one or more organ failure.  They necessitate ongoing hospitalization for continued IV therapies and subsequent lab monitoring. Total time spent interpreting labs and vitals, reviewing the medical record, coordinating care amongst consultants and care team members, directly assessing and discussing care with the patient and/or family: 55 min Laree Lock, MD [facility]  To contact the attending  physician between 7A-7P please use Epic Chat. To contact the covering physician during after hours 7P-7A, please review Amion.  06/04/2024, 5:27 PM   *This document has been created with the assistance of dictation software. Please excuse typographical errors. *   "

## 2024-06-04 NOTE — ED Notes (Signed)
 Pt confused

## 2024-06-04 NOTE — ED Notes (Signed)
 When asked orientation questions, pt answers them appropriately. But then makes comments such as get my husband to come in here, they're coming in the back door, let them in, my cough syrup is in the kitchen.

## 2024-06-04 NOTE — Plan of Care (Signed)
  Problem: Clinical Measurements: Goal: Ability to maintain clinical measurements within normal limits will improve Outcome: Progressing   Problem: Pain Managment: Goal: General experience of comfort will improve and/or be controlled Outcome: Progressing   Problem: Safety: Goal: Ability to remain free from injury will improve Outcome: Progressing

## 2024-06-04 NOTE — Progress Notes (Signed)
 "       Overnight   NAME: Jane [name] MRN: [id number] DOB : 1935-10-29    Date of Service   06/04/2024   HPI/Events of Note   HPI:  Jane [name] is a 88 y.o. Caucasian female with medical history significant for essential hypertension, dyslipidemia, mitral valve prolapse, osteoarthritis, GERD, lymphocytic colitis and diverticulitis, presented to the emergency room with acute onset of generalized weakness, tiredness and fall.  She stated that she is getting up to use the restroom and felt both of her legs gave out from under her and she felt onto the ground.  She denied any head injuries or other significant injuries.  She has been having URI symptoms recently and admitted to sick exposure.  She admitted to palpitations and dyspnea before her fall.  She has been having occasional dry cough and wheezing.  She stated that she had intermittent chest pain which has been relieved with sublingual effortlessly.  Per EMS her systolic BP was in the 80s and she had a temperature of 101.6.  She was given 1 g of p.o. Tylenol  and 500 mL IV lactated ringer she is starting having dysuria and malodorous urine yesterday for which she took Azo.  No nausea or vomiting or abdominal pain.  She had fever here that was up to 101.6 at home.  No chest pain or palpitations.  She has been having diarrhea without melena or bright red knee per rectum.   ED Course: When she came to the ER, temperature was 99.4 with otherwise normal vital signs.  Labs revealed hyponatremia 133 and hypokalemia of 3.2 blood glucose 146 and creatinine 1.13, albumin 3.1 and total protein 5.3.  High sensitive troponin T was 38 and later 32.  Lactic acid was 2.2 and CBC showed hemoglobin of 6.6 and hematocrit 20.6 with macrocytosis compared to 13.6 and 42.4 on 09/30/2023.  Respiratory panel came back positive for influenza A. EKG as reviewed by me :  EKG showed sinus rhythm with a rate of 80 with short PR interval. Imaging: Portable chest x-ray showed  no acute cardiopulmonary disease.  Short course of interstitial chronic markings.   The patient was given 500 mL IV normal saline bolus and DuoNeb.  She will be admitted to a progressive unit observation bed for further evaluation and management   Overnight:  called to bedside by Rn who reports patient had increased agitation and attempting to get out of bed. RN reports when this happened she appeared ill and he was worried she may pass out. Vitals obtained and pt is hypertensive. CBG normal. Pt is redirectable at this time and endorses 8/10 cp that feels like pressure in her chest. Troponin, BNP and EKG obtained. Pain medicine ordered.   Physical Exam Constitutional:      Appearance: She is ill-appearing.  HENT:     Mouth/Throat:     Mouth: Mucous membranes are moist.  Eyes:     Pupils: Pupils are equal, round, and reactive to light.  Cardiovascular:     Rate and Rhythm: Normal rate and regular rhythm.  Pulmonary:     Effort: Tachypnea present.     Breath sounds: Transmitted upper airway sounds present.     Comments: Coarse bilateral upper lung sounds Abdominal:     General: Bowel sounds are normal.  Skin:    General: Skin is warm.     Coloration: Skin is pale.  Neurological:     Mental Status: She is alert. She is disoriented.  Interventions/ Plan   EKG- normal sinus rhythm  Troponin, and BNP.  Pain medications - hydromorphone ordered encouraged nitroglycerin  Monitor BP closely  Sitter at bedside     Laneta Gardener- Aram BSN RN CCRN AGACNP-BC Acute Care Nurse Practitioner [facility] [place]  "

## 2024-06-04 NOTE — ED Notes (Signed)
 RN attempted twice for IV and was unsuccessful. Pt resistant when RN made attempts and redirection was difficult. IV consult placed.

## 2024-06-04 NOTE — ED Notes (Addendum)
 Pt continually pulls off heart monitor leads. Mansy MD made aware.

## 2024-06-04 NOTE — ED Notes (Addendum)
 Pt currently refusing all medications. This RN attempted education about importance of taking medications, but pt stated I don't want to hear it. Ponnala MD notified and is aware.

## 2024-06-04 NOTE — Evaluation (Signed)
 Physical Therapy Evaluation Patient Details Name: Jane [name] MRN: [id number] DOB: 07-07-1935 Today's Date: 06/04/2024  History of Present Illness  88 y.o. Caucasian female with medical history significant for essential hypertension, dyslipidemia, mitral valve prolapse, osteoarthritis, GERD, lymphocytic colitis and diverticulitis, presented to the emergency room with acute onset of generalized weakness, tiredness, and fall. +flu A.  Clinical Impression  Patient resting in bed upon arrival to room; alert, oriented to self only.  Unaware of date, location or reason for admission.  Does follow most simple, verbal commands, but often requires frequent cuing/redirection for attention to and recall of task.  Generally restless and impulsive, limited insight into deficits and need for assist. Bilat UE/LE strength and ROM grossly symmetrical and WFL; no focal weakness appreciated.  No pain reported/indicated.  Currently requiring min assist for bed mobility; min assist for sit/stand, standing balance and short-distance gait (40' x2) with RW.  Demonstrates partially reciprocal stepping pattern with fair step height/length; constant cuing for direction, walker position/management (frequently trying to step away from RW, esp as approaching seating surfaces). Difficulty maintaining accurate O2 measurement due to restlessness, movement; does appear to need O2 to maintain sats >90% with gait efforts  Would benefit from skilled PT to address above deficits and promote optimal return to PLOF.; recommend post-acute PT follow up as indicated by interdisciplinary care team.            If plan is discharge home, recommend the following: A little help with bathing/dressing/bathroom;A little help with walking and/or transfers   Can travel by private vehicle   Yes    Equipment Recommendations    Recommendations for Other Services       Functional Status Assessment Patient has had a recent decline in their  functional status and demonstrates the ability to make significant improvements in function in a reasonable and predictable amount of time.     Precautions / Restrictions Precautions Precautions: Fall Recall of Precautions/Restrictions: Impaired Restrictions Weight Bearing Restrictions Per Provider Order: No      Mobility  Bed Mobility Overal bed mobility: Needs Assistance Bed Mobility: Supine to Sit, Sit to Supine     Supine to sit: Supervision, Contact guard Sit to supine: Contact guard assist, Min assist        Transfers Overall transfer level: Needs assistance Equipment used: Rolling walker (2 wheels) Transfers: Sit to/from Stand Sit to Stand: Min assist           General transfer comment: requires UE support to complete; min assist to prevent posterior LOB with movement transitions    Ambulation/Gait Ambulation/Gait assistance: Min assist Gait Distance (Feet): 40 Feet (x2) Assistive device: Rolling walker (2 wheels)         General Gait Details: partially reciprocal stepping pattern with fair step height/length; constant cuing for direction, walker position/management (frequently trying to step away from RW, esp as approaching seating surfaces).  Difficulty maintaining accurate O2 measurement due to restlessness, movement; does appear to need O2 to maintain sats >90% with gait efforts  Stairs            Wheelchair Mobility     Tilt Bed    Modified Rankin (Stroke Patients Only)       Balance Overall balance assessment: Needs assistance Sitting-balance support: Feet supported, No upper extremity supported Sitting balance-Leahy Scale: Good     Standing balance support: Bilateral upper extremity supported Standing balance-Leahy Scale: Poor  Pertinent Vitals/Pain Pain Assessment Pain Assessment: No/denies pain    Home Living Family/patient expects to be discharged to:: Private residence Living  Arrangements: Spouse/significant other Available Help at Discharge: Family Type of Home: House Home Access: Stairs to enter Entrance Stairs-Rails: Can reach both Entrance Stairs-Number of Steps: 2   Home Layout: One level Home Equipment: Agricultural Consultant (2 wheels);Cane - single point Additional Comments: Patient questionable historian; information taken  from previous documentation--will verify with family as available    Prior Function Prior Level of Function : Independent/Modified Independent;Patient poor historian/Family not available             Mobility Comments: Patient poor historian; will verify with family as available.  Indicates baseline ambulation with SPC/RW. Unclear on fall history or home O2 ADLs Comments: Pt reports indep with ADL and IADL - no family to verify     Extremity/Trunk Assessment   Upper Extremity Assessment Upper Extremity Assessment: Generalized weakness    Lower Extremity Assessment Lower Extremity Assessment: Generalized weakness (grossly at least 4-/5 throughout)    Cervical / Trunk Assessment Cervical / Trunk Assessment: Normal  Communication   Communication Communication: No apparent difficulties    Cognition Arousal: Alert Behavior During Therapy: Restless, Impulsive   PT - Cognitive impairments: No family/caregiver present to determine baseline                       PT - Cognition Comments: Oriented to self only; unaware of location, date or reason for admission; recognizes therapist as my granddaughter Following commands: Impaired Following commands impaired: Follows one step commands inconsistently     Cueing Cueing Techniques: Verbal cues, Gestural cues, Tactile cues, Visual cues     General Comments General comments (skin integrity, edema, etc.): On 2L throughout, SpO2 >95% with activity, however pt reporting feeling slightly SOB    Exercises Other Exercises Other Exercises: Toilet transfer, ambulatory with  RW, min assist; sit/stand from standard toilet with grab bar, min assist.  Min/mod assist to sequence task, locate/manipulate toilet paper.  Min assist to prevent posterior LOB with standing balance activities (hygiene, clothing management)   Assessment/Plan    PT Assessment Patient needs continued PT services  PT Problem List Decreased strength;Decreased activity tolerance;Decreased balance;Decreased mobility;Decreased knowledge of use of DME;Decreased safety awareness;Decreased knowledge of precautions       PT Treatment Interventions DME instruction;Gait training;Stair training;Functional mobility training;Therapeutic activities;Therapeutic exercise;Balance training;Cognitive remediation;Patient/family education    PT Goals (Current goals can be found in the Care Plan section)  Acute Rehab PT Goals Patient Stated Goal: to go to the bathroom PT Goal Formulation: With patient Time For Goal Achievement: 06/18/24 Potential to Achieve Goals: Good    Frequency Min 2X/week     Co-evaluation               AM-PAC PT 6 Clicks Mobility  Outcome Measure Help needed turning from your back to your side while in a flat bed without using bedrails?: None Help needed moving from lying on your back to sitting on the side of a flat bed without using bedrails?: A Little Help needed moving to and from a bed to a chair (including a wheelchair)?: A Little Help needed standing up from a chair using your arms (e.g., wheelchair or bedside chair)?: A Little Help needed to walk in hospital room?: A Little Help needed climbing 3-5 steps with a railing? : A Lot 6 Click Score: 18    End of Session Equipment Utilized During  Treatment: Gait belt Activity Tolerance: Patient tolerated treatment well Patient left: in bed;with call bell/phone within reach;with nursing/sitter in room (patient sitting edge of bed with sitter (changing gown, replacing purewick); sitter/CNA will reconnect alarms as needed once  patient returns to supine.) Nurse Communication: Mobility status PT Visit Diagnosis: Muscle weakness (generalized) (M62.81);Difficulty in walking, not elsewhere classified (R26.2)    Time: 8958-8891 PT Time Calculation (min) (ACUTE ONLY): 27 min   Charges:   PT Evaluation $PT Eval Moderate Complexity: 1 Mod   PT General Charges $$ ACUTE PT VISIT: 1 Visit         Virgen Belland H. Delores, PT, DPT, NCS 06/04/2024, 11:18 AM [phone]

## 2024-06-04 NOTE — ED Notes (Signed)
 Pt continues to try to get out of bed. Pt educated about previous fall. Pt verbally aggressive towards Mayra, EDT. This RN and Mayra, EDT, continue to try to redirect and reassure pt. Ponnala MD messaged.

## 2024-06-04 NOTE — ED Notes (Signed)
 Patient attempting to get out of bed; this tech attempted several times to redirect back into bed. Pt passed out after attempting to get out of bed for less than a minute. Pt woke up stating her chest was hurting and she was short of breath. Requesting Nitroglycerin . Rosina, tech rushed to help this tech and Darlyn, [employer] [employer] at bedside. CBG and EKG obtained and given to provider at bedside.

## 2024-06-04 NOTE — Evaluation (Signed)
 Occupational Therapy Evaluation Patient Details Name: Jane [name] MRN: [id number] DOB: [birth year] Today's Date: 06/04/2024   History of Present Illness   88 y.o. Caucasian female with medical history significant for essential hypertension, dyslipidemia, mitral valve prolapse, osteoarthritis, GERD, lymphocytic colitis and diverticulitis, presented to the emergency room with acute onset of generalized weakness, tiredness, and fall. +flu A.     Clinical Impressions Pt was seen for OT evaluation this date. Prior to hospital admission, pt reports living at home with her spouse. Pt reports using SPC>RW for mobility. Denies difficulty completing ADL or IADL. No family present. Pt at times appears oriented but most of the session demo's impaired orientation, restless, and requiring significant cues for safety/sequencing. Pt amb ~30+30' with RW and requiring CGA-MIN A, intermittent physical assist for RW mgt and MOD multimodal cues for safety. Pt requires MIN A for UB and LB ADL. She presents with deficits in strength, balance, safety, cognition, and activity tolerance  (requiring 2L acute O2) limiting their ability to perform ADL management at baseline level. Pt would benefit from skilled OT services to address noted impairments and functional limitations (see below for any additional details) in order to maximize safety and independence while minimizing future risk of falls, injury, and readmission. Anticipate the need for follow up OT services upon acute hospital DC.    If plan is discharge home, recommend the following:   A little help with walking and/or transfers;A little help with bathing/dressing/bathroom;Assistance with cooking/housework;Assist for transportation;Help with stairs or ramp for entrance;Direct supervision/assist for medications management;Direct supervision/assist for financial management;Supervision due to cognitive status     Functional Status Assessment   Patient has had a  recent decline in their functional status and demonstrates the ability to make significant improvements in function in a reasonable and predictable amount of time.     Equipment Recommendations   Other (comment) (defer)     Recommendations for Other Services         Precautions/Restrictions   Precautions Precautions: Fall Recall of Precautions/Restrictions: Impaired Restrictions Weight Bearing Restrictions Per Provider Order: No     Mobility Bed Mobility Overal bed mobility: Needs Assistance Bed Mobility: Supine to Sit, Sit to Supine     Supine to sit: Min assist, HOB elevated Sit to supine: Min assist, HOB elevated        Transfers Overall transfer level: Needs assistance Equipment used: Rolling walker (2 wheels) Transfers: Sit to/from Stand Sit to Stand: Min assist           General transfer comment: VC for safety/RW use      Balance Overall balance assessment: Needs assistance Sitting-balance support: No upper extremity supported, Feet supported Sitting balance-Leahy Scale: Fair     Standing balance support: Bilateral upper extremity supported, Reliant on assistive device for balance Standing balance-Leahy Scale: Poor                             ADL either performed or assessed with clinical judgement   ADL Overall ADL's : Needs assistance/impaired                 Upper Body Dressing : Sitting;Minimal assistance   Lower Body Dressing: Sit to/from stand;Minimal assistance;Moderate assistance;Cueing for safety   Toilet Transfer: Minimal assistance;BSC/3in1;Stand-pivot;Cueing for safety           Functional mobility during ADLs: Contact guard assist;Cueing for sequencing;Cueing for safety;Rolling walker (2 wheels)       Vision  Perception         Praxis         Pertinent Vitals/Pain Pain Assessment Pain Assessment: No/denies pain     Extremity/Trunk Assessment Upper Extremity Assessment Upper  Extremity Assessment: Generalized weakness   Lower Extremity Assessment Lower Extremity Assessment: Generalized weakness   Cervical / Trunk Assessment Cervical / Trunk Assessment: Normal   Communication Communication Communication: No apparent difficulties   Cognition Arousal: Alert Behavior During Therapy: Restless, Impulsive Cognition: Cognition impaired   Orientation impairments: Situation Awareness: Intellectual awareness impaired, Online awareness impaired Memory impairment (select all impairments): Short-term memory, Working memory Attention impairment (select first level of impairment): Sustained attention Executive functioning impairment (select all impairments): Problem solving OT - Cognition Comments: pt demo's impairments in safety awareness requiring cues throughout session for safety                 Following commands: Impaired Following commands impaired: Follows one step commands inconsistently     Cueing  General Comments   Cueing Techniques: Verbal cues;Gestural cues;Tactile cues;Visual cues  On 2L throughout, SpO2 >95% with activity, however pt reporting feeling slightly SOB   Exercises Other Exercises Other Exercises: Pt amb ~30+30' with RW and requiring CGA-MIN A, intermittent physical assist for RW mgt and MOD multimodal cues for safety   Shoulder Instructions      Home Living Family/patient expects to be discharged to:: Private residence Living Arrangements: Spouse/significant other                           Home Equipment: Agricultural Consultant (2 wheels);Cane - single point          Prior Functioning/Environment Prior Level of Function : Independent/Modified Independent;Patient poor historian/Family not available             Mobility Comments: pt reports using SPC>RW for mobility - no family to verify ADLs Comments: Pt reports indep with ADL and IADL - no family to verify    OT Problem List: Decreased  strength;Cardiopulmonary status limiting activity;Decreased cognition;Decreased activity tolerance;Impaired balance (sitting and/or standing);Decreased knowledge of use of DME or AE;Decreased safety awareness   OT Treatment/Interventions: Self-care/ADL training;Therapeutic exercise;Therapeutic activities;Cognitive remediation/compensation;Energy conservation;DME and/or AE instruction;Patient/family education;Balance training      OT Goals(Current goals can be found in the care plan section)   Acute Rehab OT Goals Patient Stated Goal: see Christopher OT Goal Formulation: With patient Time For Goal Achievement: 06/18/24 Potential to Achieve Goals: Good ADL Goals Pt Will Perform Upper Body Dressing: with modified independence;sitting Pt Will Perform Lower Body Dressing: with modified independence;sitting/lateral leans;sit to/from stand Pt Will Transfer to Toilet: with modified independence;ambulating (LRAD) Pt Will Perform Toileting - Clothing Manipulation and hygiene: with modified independence;sitting/lateral leans;sit to/from stand Additional ADL Goal #1: Pt will complete mobility/ADL tasks with mod indep and NO VC for safety, 5/5 opportunities.   OT Frequency:  Min 2X/week    Co-evaluation              AM-PAC OT 6 Clicks Daily Activity     Outcome Measure Help from another person eating meals?: None Help from another person taking care of personal grooming?: A Little Help from another person toileting, which includes using toliet, bedpan, or urinal?: A Little Help from another person bathing (including washing, rinsing, drying)?: A Little Help from another person to put on and taking off regular upper body clothing?: A Little Help from another person to put on and taking off regular lower  body clothing?: A Little 6 Click Score: 19   End of Session Equipment Utilized During Treatment: Rolling walker (2 wheels);Gait belt;Oxygen Nurse Communication: Mobility status  Activity  Tolerance: Patient tolerated treatment well Patient left: in bed;with call bell/phone within reach;with bed alarm set  OT Visit Diagnosis: Unsteadiness on feet (R26.81);Muscle weakness (generalized) (M62.81);Other symptoms and signs involving cognitive function                Time: 9145-9082 OT Time Calculation (min): 23 min Charges:  OT General Charges $OT Visit: 1 Visit OT Evaluation $OT Eval Low Complexity: 1 Low OT Treatments $Therapeutic Activity: 8-22 mins  Warren SAUNDERS., MPH, MS, OTR/L ascom [phone] 06/04/2024, 10:20 AM

## 2024-06-04 NOTE — ED Notes (Signed)
 Pt trying to get out of bed.  Meds given for sleep.  Siderails up.

## 2024-06-04 NOTE — ED Notes (Signed)
 Pt continually pulls off pulse ox.

## 2024-06-04 NOTE — ED Notes (Addendum)
 Pt continually trying to get out of bed. This RN and Brittany, EDT, in room to reassure pt to get back into bed. Pt verbally and physically aggressive towards this RN and Brittany, EDT. Pt keeps stating to get out of my house. Pt then sits down and states that her chest is hurting 8/10. Upon assessment, pt was sitting on side of bed, eyes staring straight, and appeared pale. Natalie Donati-Garmon, NP, notified. CBG collected, EKG performed, vitals documented. Pulse ox and cardiac leads placed back on pt. Pt placed on 2LNC. NP in room to assess pt. See new orders.

## 2024-06-04 NOTE — ED Notes (Signed)
 RN attempted to call pt's husband Christopher. HIPAA compliant voicemail left.

## 2024-06-05 ENCOUNTER — Encounter

## 2024-06-05 DIAGNOSIS — E119 Type 2 diabetes mellitus without complications: Secondary | ICD-10-CM

## 2024-06-05 DIAGNOSIS — D649 Anemia, unspecified: Secondary | ICD-10-CM | POA: Diagnosis not present

## 2024-06-05 LAB — CBC
HCT: 38.9 % (ref 36.0–46.0)
Hemoglobin: 13.2 g/dL (ref 12.0–15.0)
MCH: 32.1 pg (ref 26.0–34.0)
MCHC: 33.9 g/dL (ref 30.0–36.0)
MCV: 94.6 fL (ref 80.0–100.0)
Platelets: 177 K/uL (ref 150–400)
RBC: 4.11 MIL/uL (ref 3.87–5.11)
RDW: 13.2 % (ref 11.5–15.5)
WBC: 7.7 K/uL (ref 4.0–10.5)
nRBC: 0 % (ref 0.0–0.2)

## 2024-06-05 LAB — BASIC METABOLIC PANEL WITH GFR
Anion gap: 15 (ref 5–15)
BUN: 21 mg/dL (ref 8–23)
CO2: 23 mmol/L (ref 22–32)
Calcium: 9.6 mg/dL (ref 8.9–10.3)
Chloride: 98 mmol/L (ref 98–111)
Creatinine, Ser: 1.05 mg/dL — ABNORMAL HIGH (ref 0.44–1.00)
GFR, Estimated: 51 mL/min — ABNORMAL LOW
Glucose, Bld: 80 mg/dL (ref 70–99)
Potassium: 3.2 mmol/L — ABNORMAL LOW (ref 3.5–5.1)
Sodium: 136 mmol/L (ref 135–145)

## 2024-06-05 LAB — MAGNESIUM: Magnesium: 1.9 mg/dL (ref 1.7–2.4)

## 2024-06-05 MED ORDER — POTASSIUM CHLORIDE CRYS ER 20 MEQ PO TBCR
60.0000 meq | EXTENDED_RELEASE_TABLET | Freq: Once | ORAL | Status: AC
  Administered 2024-06-05: 60 meq via ORAL
  Filled 2024-06-05: qty 3

## 2024-06-05 NOTE — Progress Notes (Signed)
 Mobility Specialist - Progress Note   06/05/24 1400  Mobility  Activity Ambulated with assistance;Stood at bedside;Respositioned in chair  Level of Assistance Contact guard assist, steadying assist  Assistive Device Front wheel walker  Distance Ambulated (ft) 40 ft  Range of Motion/Exercises Active  Activity Response Tolerated well  Mobility visit 1 Mobility  Mobility Specialist Start Time (ACUTE ONLY) 1251  Mobility Specialist Stop Time (ACUTE ONLY) 1310  Mobility Specialist Time Calculation (min) (ACUTE ONLY) 19 min   Pt was in the recliner with a sitter in the room upon entry. Pt agreed to mobility. Pt is able today to STS with minA. Pt ambulated well. Pt did not need a recovery break throughout activity. After activity pt returned to the room repositioned in the recliner with needs in reach and sitter in the room upon exit.  Jane [name] Mobility Specialist 06/05/2024, 2:39 PM

## 2024-06-05 NOTE — Progress Notes (Addendum)
 " PROGRESS NOTE    Jane [name]  FMW:[id number] DOB: 1936/03/27 DOA: 06/03/2024 PCP: Glover Lenis, MD  Chief Complaint  Patient presents with   [facility] Course:  Jane [name] is a 88 y.o. Caucasian female with medical history significant for essential hypertension, dyslipidemia, mitral valve prolapse, osteoarthritis, GERD, lymphocytic colitis and diverticulitis, presented to the emergency room with acute onset of generalized weakness, feeling tired and s/p fall, denies head injury, reports she was weak and her legs gave out.  Also had URI symptoms and had sick contacts.  Had symptoms likely due to influenza A infection.  On presentation was hypotensive, improved s/p IV fluids likely dehydrated.  Found to have influenza A positive, Hb on presentation 6.6, s/p 1 unit PRBC improved to 11.5, suspect initial lab was in error.  Hospital course as below  Subjective:  Calm, no dyspnea, no dysuria   Objective: Vitals:   06/04/24 1818 06/04/24 1957 06/05/24 1019 06/05/24 1225  BP: (!) 143/89 120/88 (!) 142/68 119/61  Pulse: 88 92 83 82  Resp:  18 18   Temp:  98.7 F (37.1 C) 98 F (36.7 C) 98.1 F (36.7 C)  TempSrc:   Oral   SpO2: 98% 97% 98% 96%  Weight:      Height:        Intake/Output Summary (Last 24 hours) at 06/05/2024 1513 Last data filed at 06/05/2024 1410 Gross per 24 hour  Intake 240 ml  Output --  Net 240 ml   Filed Weights   06/03/24 0051 06/04/24 1650  Weight: 79.4 kg 69 kg    Examination: GENERAL:  88 y.o.-year-old Caucasian female patient sitting in chair with no acute distress  NECK:  Supple, no jugular venous distention LUNGS: crackles b/l bases, no wheezing, rales,rhonchi or crepitation CARDIOVASCULAR: Regular rate and rhythm, S1, S2 normal ABDOMEN: Soft, nondistended, nontender. Bowel sounds present EXTREMITIES: No pedal edema, cyanosis, or clubbing NEUROLOGIC: AO x2, Pleasantly confused, no gross focal deficitsr  Assessment &  Plan:  Macrocytic anemia Hb on presentation 6.6, s/p 1u pRBC improved to 11.5, repeat ~ 11.9 Suspect initial lab on presentation was error Denies hematuria, bright red blood per rectum/melena Reticulocyte level normal, folate normal, Iron panel shows ACD, LDH normal B12 level low normal, B12 1000 mcg IM x weekly FOBT pending Monitor Hb, appears stable and symptoms on presentation likely due to Influenza  Acute bronchitis Influenza A On Tamiflu  x 5 days DuoNebs as needed.  Discontinued prednisone  due to confusion CXR no acute abnormality, no indication for antibiotics Supportive care  Delirium Suspect likely hospital delirium, sundowning Reports brief period of passing out ? Vasovagal CXR negative, UA no evidence of UTI Hold Xanax .  Discontinued prednisone  Resolved right now  Chronic HFpEF Patient reports history of heart failure Appears euvolemic  Lactic acidosis - resolved likely secondary to volume depletion, resolved s/p IV fluids Reports diarrhea resolved, CT with no evidence of acute colitis Blood culture 1/2: Staph epidermidis, likely contaminant  Elevated Troponin Troponin downtrended, denies chest pain Likely due to demand ischemia  HTN Bp low normal today will hold home coreg  and lisinopril  CKD stage3a Monitor Cr   Mild Hyponatremia - resolved S/p IV fluids Monitor BMP   Hypokalemia Monitor and replete as needed Hydrochlorothiazide on hold  Coronary artery disease continue as needed sublingual nitroglycerin , Coreg , Imdur , Plavix    Anxiety and depression continue Celexa   Obesity class I Body mass index is 27.84 kg/m. - Outpatient follow  up for lifestyle modification and risk factor management  Microscopic hematuria Follow-up outpatient for repeat UA with PCP  Generalized weakness S/p fall due to Influenza A infection PT/OT rec SNF, patient declines. Son can take her home tomorrow  Compression fracture Chart review shows T3 compression  fracture with approximately 80% height loss CT with incidental finding L5 compression fracture with vertebral plana, new since 2019 and age-indeterminate Reported she has been following up with her PCP and recently got bone scan No new pain, would like to follow-up further with her PCP  DVT prophylaxis: Lovenox  SQ   Code Status: Full Code Disposition:  SNF Family: son updated telephonically 12/31  Consultants:  None  Procedures:  None  Antimicrobials:  Anti-infectives (From admission, onward)    Start     Dose/Rate Route Frequency Ordered Stop   06/03/24 2200  oseltamivir  (TAMIFLU ) capsule 30 mg        30 mg Oral 2 times daily 06/03/24 1318 06/08/24 0959   06/03/24 1330  oseltamivir  (TAMIFLU ) capsule 75 mg        75 mg Oral  Once 06/03/24 1315 06/03/24 1436       Data Reviewed: I have personally reviewed following labs and imaging studies CBC: Recent Labs  Lab 06/03/24 0116 06/03/24 0500 06/03/24 1133 06/03/24 1618 06/04/24 1159 06/05/24 0415  WBC 9.5 5.9  --   --  7.8 7.7  NEUTROABS 7.6  --   --   --   --   --   HGB 6.6* 11.5* 11.6* 11.9* 12.7 13.2  HCT 20.6* 34.9* 35.8* 37.0 37.5 38.9  MCV 105.1* 100.9*  --   --  96.4 94.6  PLT 228 174  --   --  169 177   Basic Metabolic Panel: Recent Labs  Lab 06/03/24 0116 06/03/24 0500 06/03/24 0537 06/04/24 1048 06/05/24 0415  NA 133* 136  --  136 136  K 3.2* 3.6  --  4.2 3.2*  CL 99 103  --  100 98  CO2 22 25  --  23 23  GLUCOSE 146* 108*  --  99 80  BUN 22 19  --  17 21  CREATININE 1.13* 1.01*  --  0.95 1.05*  CALCIUM  8.9 9.4  --  9.6 9.6  MG  --   --  2.0 1.9 1.9   GFR: Estimated Creatinine Clearance: 35 mL/min (A) (by C-G formula based on SCr of 1.05 mg/dL (H)). Liver Function Tests: Recent Labs  Lab 06/03/24 0116  AST 17  ALT 13  ALKPHOS 59  BILITOT 0.2  PROT 5.3*  ALBUMIN 3.1*   CBG: Recent Labs  Lab 06/04/24 0622 06/04/24 1445  GLUCAP 112* 122*    Recent Results (from the past 240 hours)   Resp panel by RT-PCR (RSV, Flu A&B, Covid) Urine, Catheterized     Status: Abnormal   Collection Time: 06/03/24  1:16 AM   Specimen: Urine, Catheterized; Nasal Swab  Result Value Ref Range Status   SARS Coronavirus 2 by RT PCR NEGATIVE NEGATIVE Final    Comment: (NOTE) SARS-CoV-2 target nucleic acids are NOT DETECTED.  The SARS-CoV-2 RNA is generally detectable in upper respiratory specimens during the acute phase of infection. The lowest concentration of SARS-CoV-2 viral copies this assay can detect is 138 copies/mL. A negative result does not preclude SARS-Cov-2 infection and should not be used as the sole basis for treatment or other patient management decisions. A negative result may occur with  improper specimen collection/handling, submission of  specimen other than nasopharyngeal swab, presence of viral mutation(s) within the areas targeted by this assay, and inadequate number of viral copies(<138 copies/mL). A negative result must be combined with clinical observations, patient history, and epidemiological information. The expected result is Negative.  Fact Sheet for Patients:  bloggercourse.com  Fact Sheet for Healthcare Providers:  seriousbroker.it  This test is no t yet approved or cleared by the [place]  FDA and  has been authorized for detection and/or diagnosis of SARS-CoV-2 by FDA under an Emergency Use Authorization (EUA). This EUA will remain  in effect (meaning this test can be used) for the duration of the COVID-19 declaration under Section 564(b)(1) of the Act, 21 U.S.C.section 360bbb-3(b)(1), unless the authorization is terminated  or revoked sooner.       Influenza A by PCR POSITIVE (A) NEGATIVE Final   Influenza B by PCR NEGATIVE NEGATIVE Final    Comment: (NOTE) The Xpert Xpress SARS-CoV-2/FLU/RSV plus assay is intended as an aid in the diagnosis of influenza from Nasopharyngeal swab specimens  and should not be used as a sole basis for treatment. Nasal washings and aspirates are unacceptable for Xpert Xpress SARS-CoV-2/FLU/RSV testing.  Fact Sheet for Patients: bloggercourse.com  Fact Sheet for Healthcare Providers: seriousbroker.it  This test is not yet approved or cleared by the [place]  FDA and has been authorized for detection and/or diagnosis of SARS-CoV-2 by FDA under an Emergency Use Authorization (EUA). This EUA will remain in effect (meaning this test can be used) for the duration of the COVID-19 declaration under Section 564(b)(1) of the Act, 21 U.S.C. section 360bbb-3(b)(1), unless the authorization is terminated or revoked.     Resp Syncytial Virus by PCR NEGATIVE NEGATIVE Final    Comment: (NOTE) Fact Sheet for Patients: bloggercourse.com  Fact Sheet for Healthcare Providers: seriousbroker.it  This test is not yet approved or cleared by the [place]  FDA and has been authorized for detection and/or diagnosis of SARS-CoV-2 by FDA under an Emergency Use Authorization (EUA). This EUA will remain in effect (meaning this test can be used) for the duration of the COVID-19 declaration under Section 564(b)(1) of the Act, 21 U.S.C. section 360bbb-3(b)(1), unless the authorization is terminated or revoked.  Performed at [facility], [address] [place], [place] [address]   Blood culture (routine x 2)     Status: Abnormal (Preliminary result)   Collection Time: 06/03/24  1:16 AM   Specimen: BLOOD  Result Value Ref Range Status   Specimen Description   Final    BLOOD RIGHT ANTECUBITAL Performed at [facility], [address], [place], [place] [address]    Special Requests   Final    BOTTLES DRAWN AEROBIC AND ANAEROBIC Blood Culture adequate volume Performed at [facility], [address] [place], [place]  [address]    Culture  Setup Time   Final    GRAM POSITIVE COCCI IN BOTH AEROBIC AND ANAEROBIC BOTTLES CRITICAL RESULT CALLED TO, READ BACK BY AND VERIFIED WITH:  JASON ROBBINS AT 0040 06/04/24 JG    Culture (A)  Final    STAPHYLOCOCCUS EPIDERMIDIS THE SIGNIFICANCE OF ISOLATING THIS ORGANISM FROM A SINGLE SET OF BLOOD CULTURES WHEN MULTIPLE SETS ARE DRAWN IS UNCERTAIN. PLEASE NOTIFY THE MICROBIOLOGY DEPARTMENT WITHIN ONE WEEK IF SPECIATION AND SENSITIVITIES ARE REQUIRED. Performed at [facility], [address], [place], [place] [address]    Report Status PENDING  Incomplete  Blood culture (routine x 2)     Status: None (  Preliminary result)   Collection Time: 06/03/24  1:16 AM   Specimen: BLOOD  Result Value Ref Range Status   Specimen Description BLOOD LEFT ANTECUBITAL  Final   Special Requests   Final    BOTTLES DRAWN AEROBIC AND ANAEROBIC Blood Culture adequate volume   Culture   Final    NO GROWTH 2 DAYS Performed at [facility], [address] [place], [place] [address]    Report Status PENDING  Incomplete  Blood Culture ID Panel (Reflexed)     Status: Abnormal   Collection Time: 06/03/24  1:16 AM  Result Value Ref Range Status   Enterococcus faecalis NOT DETECTED NOT DETECTED Final   Enterococcus Faecium NOT DETECTED NOT DETECTED Final   Listeria monocytogenes NOT DETECTED NOT DETECTED Final   Staphylococcus species DETECTED (A) NOT DETECTED Final    Comment: CRITICAL RESULT CALLED TO, READ BACK BY AND VERIFIED WITH:  JASON ROBBINS AT 0040 06/04/24 JG    Staphylococcus aureus (BCID) NOT DETECTED NOT DETECTED Final   Staphylococcus epidermidis DETECTED (A) NOT DETECTED Final    Comment: CRITICAL RESULT CALLED TO, READ BACK BY AND VERIFIED WITH:  JASON ROBBINS AT 0040 06/04/24 JG    Staphylococcus lugdunensis NOT DETECTED NOT DETECTED Final   Streptococcus species NOT DETECTED NOT DETECTED Final   Streptococcus agalactiae NOT DETECTED NOT DETECTED Final    Streptococcus pneumoniae NOT DETECTED NOT DETECTED Final   Streptococcus pyogenes NOT DETECTED NOT DETECTED Final   A.calcoaceticus-baumannii NOT DETECTED NOT DETECTED Final   Bacteroides fragilis NOT DETECTED NOT DETECTED Final   Enterobacterales NOT DETECTED NOT DETECTED Final   Enterobacter cloacae complex NOT DETECTED NOT DETECTED Final   Escherichia coli NOT DETECTED NOT DETECTED Final   Klebsiella aerogenes NOT DETECTED NOT DETECTED Final   Klebsiella oxytoca NOT DETECTED NOT DETECTED Final   Klebsiella pneumoniae NOT DETECTED NOT DETECTED Final   Proteus species NOT DETECTED NOT DETECTED Final   Salmonella species NOT DETECTED NOT DETECTED Final   Serratia marcescens NOT DETECTED NOT DETECTED Final   Haemophilus influenzae NOT DETECTED NOT DETECTED Final   Neisseria meningitidis NOT DETECTED NOT DETECTED Final   Pseudomonas aeruginosa NOT DETECTED NOT DETECTED Final   Stenotrophomonas maltophilia NOT DETECTED NOT DETECTED Final   Candida albicans NOT DETECTED NOT DETECTED Final   Candida auris NOT DETECTED NOT DETECTED Final   Candida glabrata NOT DETECTED NOT DETECTED Final   Candida krusei NOT DETECTED NOT DETECTED Final   Candida parapsilosis NOT DETECTED NOT DETECTED Final   Candida tropicalis NOT DETECTED NOT DETECTED Final   Cryptococcus neoformans/gattii NOT DETECTED NOT DETECTED Final   Methicillin resistance mecA/C NOT DETECTED NOT DETECTED Final    Comment: Performed at [facility], [address], [place], [place] [address]     Radiology Studies: No results found.   Scheduled Meds:  carvedilol   6.25 mg Oral BID   cholecalciferol   2,000 Units Oral Daily   citalopram   20 mg Oral Daily   clopidogrel   75 mg Oral Daily   enoxaparin  (LOVENOX ) injection  40 mg Subcutaneous Q24H   isosorbide  mononitrate  30 mg Oral BID   lisinopril  10 mg Oral Daily   oseltamivir   30 mg Oral BID   pantoprazole   40 mg Oral Daily   potassium chloride   60 mEq Oral Once    Continuous Infusions:     LOS: 1 day   Devaughn KATHEE Ban, MD [facility]  To contact the attending physician between 7A-7P please  use Epic Chat. To contact the covering physician during after hours 7P-7A, please review Amion.  06/05/2024, 3:13 PM     "

## 2024-06-05 NOTE — TOC Initial Note (Signed)
 Transition of Care [facility]) - Initial/Assessment Note    Patient Details  Name: Jane [name] MRN: [id number] Date of Birth: Oct 21, 1935  [facility] CM/SW Contact:    Shasta DELENA Daring, RN Phone Number: 06/05/2024, 2:19 PM  Clinical Narrative:                 RNCM assessed patient. She was oriented x4, but did appear hesitant.  Often followed a train of thought rather than just answering questions.   Discussed recommendations for SNF placement and patient said she does not want to do that. She prefers to stay with her husband. She is very interested in home health services.  Asked if I could talk to her husband and discuss with him and she said yes. Called him at number in chart but had to leave message with call back info.  Expected Discharge Plan: Home w Home Health Services Barriers to Discharge: Continued Medical Work up   Patient Goals and CMS Choice Patient states their goals for this hospitalization and ongoing recovery are:: go back home          Expected Discharge Plan and Services                                              Prior Living Arrangements/Services   Lives with:: Spouse Patient language and need for interpreter reviewed:: Yes Do you feel safe going back to the place where you live?: Yes      Need for Family Participation in Patient Care: Yes (Comment) Care giver support system in place?: Yes (comment)   Criminal Activity/Legal Involvement Pertinent to Current Situation/Hospitalization: No - Comment as needed  Activities of Daily Living      Permission Sought/Granted Permission sought to share information with : Facility Industrial/product Designer granted to share information with : Yes, Verbal Permission Granted              Emotional Assessment Appearance:: Appears stated age Attitude/Demeanor/Rapport: Gracious, Engaged Affect (typically observed): Appropriate Orientation: : Oriented to Self, Oriented to Place,  Oriented to  Time, Oriented to Situation Alcohol / Substance Use: Not Applicable Psych Involvement: No (comment)  Admission diagnosis:  Weakness [R53.1] Influenza [J11.1] Symptomatic anemia [D64.9] Anemia, unspecified type [D64.9] Patient Active Problem List   Diagnosis Date Noted   Symptomatic anemia 06/03/2024   Hyponatremia 06/03/2024   Hypokalemia 06/03/2024   Hypotension 06/03/2024   Anxiety and depression 06/03/2024   Coronary artery disease 06/03/2024   Influenza A 06/03/2024   Unstable angina (HCC) 12/08/2017   Pelvic fracture (HCC) 11/25/2016   PCP:  Glover Lenis, MD Pharmacy:   [facility] - [place], [place] - [address] [place] [address] [place] [address] [place] [address] Phone: [phone] Fax: [phone]  [facility] - [place], [place] - [address] [place] [address] Phone: [phone] Fax: [phone]  [id number] - [place], [place] - [address] [place] [address] Phone: [phone] Fax: [phone]     Social Drivers of Health (SDOH) Social History: SDOH Screenings   Food Insecurity: No Food Insecurity (03/22/2024)   Received from [facility]  Housing: Low Risk  (03/22/2024)   Received from [facility]  Transportation Needs: No Transportation Needs (03/22/2024)   Received  from [facility]  Utilities: Not At Risk (03/22/2024)   Received from [facility]  Financial Resource Strain: Low Risk  (03/22/2024)   Received from [facility]  Tobacco Use: Medium Risk (06/03/2024)   SDOH Interventions:     Readmission Risk Interventions     No data to display

## 2024-06-05 NOTE — Progress Notes (Signed)
 Occupational Therapy Treatment Patient Details Name: Jane [name] MRN: [id number] DOB: [birth year] Today's Date: 06/05/2024   History of present illness 88 y.o. Caucasian female with medical history significant for essential hypertension, dyslipidemia, mitral valve prolapse, osteoarthritis, GERD, lymphocytic colitis and diverticulitis, presented to the emergency room with acute onset of generalized weakness, tiredness, and fall. +flu A.   OT comments  Patient seen for OT treatment on this date. Upon arrival to room patient resting in bed with lights off/blinds closed; patient on delirium precautions, OT educated sitter on importance of following normal circadian rhythm, lights turned on and blinds opened- OT oriented patient to place/time of day with poor understanding from patient, patient believes she is in her home and became minimally agitated when redirected. OT facilitated ADL and provided toiletries to patient; patient required orientation to items and CGA at all times in stance. Patinet performing bed mobility, transfers, toileting, grooming and LB dressing with CGA/SBA. OT provided education to sitter on importance of keeping patient engaged and awake during daytime hours to facilitate better rest at night, sitter agreeable.   Patient ended treatment sitting in recliner with breakfast set up with and all needs within reach. Patient making good progress toward goals, will continue to follow POC. Discharge recommendation remains appropriate.        If plan is discharge home, recommend the following:  A little help with walking and/or transfers;A little help with bathing/dressing/bathroom;Assistance with cooking/housework;Assist for transportation;Help with stairs or ramp for entrance;Direct supervision/assist for medications management;Direct supervision/assist for financial management;Supervision due to cognitive status   Equipment Recommendations  Other (comment)    Recommendations for  Other Services      Precautions / Restrictions Precautions Precautions: Fall Recall of Precautions/Restrictions: Impaired Restrictions Weight Bearing Restrictions Per Provider Order: No       Mobility Bed Mobility Overal bed mobility: Needs Assistance Bed Mobility: Supine to Sit     Supine to sit: Supervision          Transfers Overall transfer level: Needs assistance Equipment used: Rolling walker (2 wheels) Transfers: Sit to/from Stand Sit to Stand: Contact guard assist           General transfer comment: constant cues for walker management/ CCGA while in stance     Balance Overall balance assessment: Needs assistance Sitting-balance support: Feet supported, No upper extremity supported Sitting balance-Leahy Scale: Good     Standing balance support: Bilateral upper extremity supported Standing balance-Leahy Scale: Fair                             ADL either performed or assessed with clinical judgement   ADL Overall ADL's : Needs assistance/impaired     Grooming: Wash/dry hands;Wash/dry face;Oral care;Applying deodorant;Brushing hair;Contact guard assist;Standing Grooming Details (indicate cue type and reason): orienting patient to items                 Toilet Transfer: Contact guard assist;BSC/3in1   Toileting- Clothing Manipulation and Hygiene: Contact guard assist;Sit to/from stand         General ADL Comments: cues throughout all tasks for item orientation and task intiation    Extremity/Trunk Assessment              Vision       Perception     Praxis     Communication Communication Communication: No apparent difficulties   Cognition Arousal: Alert Behavior During Therapy: WFL for tasks assessed/performed Cognition: Cognition impaired  Orientation impairments: Situation, Time, Place Awareness: Intellectual awareness impaired, Online awareness impaired Memory impairment (select all impairments): Short-term  memory, Working memory Attention impairment (select first level of impairment): Sustained attention Executive functioning impairment (select all impairments): Organization OT - Cognition Comments: pt demo's impairments in safety awareness requiring cues throughout session for safety                 Following commands: Impaired Following commands impaired: Follows one step commands inconsistently      Cueing   Cueing Techniques: Verbal cues, Gestural cues, Tactile cues, Visual cues  Exercises      Shoulder Instructions       General Comments      Pertinent Vitals/ Pain       Pain Assessment Pain Assessment: No/denies pain  Home Living                                          Prior Functioning/Environment              Frequency  Min 2X/week        Progress Toward Goals  OT Goals(current goals can now be found in the care plan section)  Progress towards OT goals: Progressing toward goals  Acute Rehab OT Goals Patient Stated Goal: none stated OT Goal Formulation: Patient unable to participate in goal setting Time For Goal Achievement: 06/18/24 Potential to Achieve Goals: Good ADL Goals Pt Will Perform Upper Body Dressing: with modified independence;sitting Pt Will Perform Lower Body Dressing: with modified independence;sitting/lateral leans;sit to/from stand Pt Will Transfer to Toilet: with modified independence;ambulating Pt Will Perform Toileting - Clothing Manipulation and hygiene: with modified independence;sitting/lateral leans;sit to/from stand Additional ADL Goal #1: Pt will complete mobility/ADL tasks with mod indep and NO VC for safety, 5/5 opportunities.  Plan      Co-evaluation                 AM-PAC OT 6 Clicks Daily Activity     Outcome Measure   Help from another person eating meals?: None Help from another person taking care of personal grooming?: A Little Help from another person toileting, which includes  using toliet, bedpan, or urinal?: A Little Help from another person bathing (including washing, rinsing, drying)?: A Little Help from another person to put on and taking off regular upper body clothing?: A Little Help from another person to put on and taking off regular lower body clothing?: A Little 6 Click Score: 19    End of Session Equipment Utilized During Treatment: Rolling walker (2 wheels)  OT Visit Diagnosis: Unsteadiness on feet (R26.81);Muscle weakness (generalized) (M62.81);Other symptoms and signs involving cognitive function   Activity Tolerance Patient tolerated treatment well   Patient Left in chair;with call bell/phone within reach;with nursing/sitter in room   Nurse Communication Mobility status        Time: 0938-1000 OT Time Calculation (min): 22 min  Charges: OT General Charges $OT Visit: 1 Visit OT Treatments $Self Care/Home Management : 8-22 mins  Rogers Clause, OT/L MSOT, 06/05/2024

## 2024-06-05 NOTE — Progress Notes (Addendum)
 Pt refused all night time medications, safety precautions, and telemetry monitor. Pt is agitated and verbally and physically abusive towards staff. Pt given 5mg  IM zyprexa  at 2135 with minimal changes in behavior. Several attempts to reorient pt but pt remains confused and noncompliant. Safety sitter present at the bedside, bed in lowest position, and call bell is within reach.

## 2024-06-06 DIAGNOSIS — D649 Anemia, unspecified: Secondary | ICD-10-CM | POA: Diagnosis not present

## 2024-06-06 LAB — CULTURE, BLOOD (ROUTINE X 2): Special Requests: ADEQUATE

## 2024-06-06 MED ORDER — OSELTAMIVIR PHOSPHATE 30 MG PO CAPS: 30.0000 mg | ORAL_CAPSULE | Freq: Two times a day (BID) | ORAL | 0 refills | Status: AC

## 2024-06-06 NOTE — Discharge Summary (Signed)
 [name] FMW:[id number] DOB: 1936/05/19 DOA: 06/03/2024  PCP: Glover Lenis, MD  Admit date: 06/03/2024 Discharge date: 06/06/2024  Time spent: 35 minutes  Recommendations for Outpatient Follow-up:  Pcp f/u 1 week, check potassium then     Discharge Diagnoses:  Principal Problem:   Symptomatic anemia Active Problems:   Hypotension   Hyponatremia   Influenza A   Hypokalemia   Anxiety and depression   Coronary artery disease   Benign essential hypertension   Type 2 diabetes mellitus without complications [facility])   Discharge Condition: stable  Diet recommendation: heart healthy  Filed Weights   06/03/24 0051 06/04/24 1650  Weight: 79.4 kg 69 kg    History of present illness:   Jane [name] is a 89 y.o. Caucasian female with medical history significant for essential hypertension, dyslipidemia, mitral valve prolapse, osteoarthritis, GERD, lymphocytic colitis and diverticulitis, presented to the emergency room with acute onset of generalized weakness, tiredness and fall.  She stated that she is getting up to use the restroom and felt both of her legs gave out from under her and she felt onto the ground.  She denied any head injuries or other significant injuries.  She has been having URI symptoms recently and admitted to sick exposure.  She admitted to palpitations and dyspnea before her fall.  She has been having occasional dry cough and wheezing.  She stated that she had intermittent chest pain which has been relieved with sublingual effortlessly.  Per EMS her systolic BP was in the 80s and she had a temperature of 101.6.  She was given 1 g of p.o. Tylenol  and 500 mL IV lactated ringer she is starting having dysuria and malodorous urine yesterday for which she took Azo.  No nausea or vomiting or abdominal pain.  She had fever here that was up to 101.6 at home.  No chest pain or palpitations.  She has been having diarrhea without melena or bright red knee per rectum.    Hospital Course:   Patient presents with weakness and upper respiratory symptoms. Found to have influenza A, treated with tamiflu . No hypoxia. Symptoms now very mild and improving. Found to have hgb of 6.6 on presentation, no report of bleeding, was transfused one unit and hgb improved to 12-13. The low hgb likely lab error or dilution from IV fluids, no bleeding identified. PT/OT advise skilled nursing but patient declines this, opts instead for home health which we have ordered. Son reports all DME already in place. Also found to be hypokalemic, which was replaced. Advise holding chlorthalidone at discharge and f/u with PCP, consider alternate BP agent moving forward it an additional agent is needed. Hospital course complicated by intermittent delirium, no agitation. Discharge plan reviewed with patient's son who is in agreement.   Procedures: none   Consultations: none  Discharge Exam: Vitals:   06/06/24 0537 06/06/24 0725  BP: (!) 98/59 (!) 153/55  Pulse: 70 73  Resp: 18 16  Temp: 98 F (36.7 C) 98.3 F (36.8 C)  SpO2:  95%    General: NAD Cardiovascular: RRR Respiratory: CTAB  Discharge Instructions   Discharge Instructions     Diet - low sodium heart healthy   Complete by: As directed    Increase activity slowly   Complete by: As directed       Allergies as of 06/06/2024       Reactions   Codeine Anaphylaxis, Other (See Comments)   Adhesive [tape]    Diazepam Other (See Comments)  shakes   Ezetimibe Other (See Comments)   Myalgia        Medication List     PAUSE taking these medications    chlorthalidone 25 MG tablet Wait to take this until your doctor or other care provider tells you to start again. Commonly known as: HYGROTON Take 12.5 mg by mouth daily.       STOP taking these medications    aspirin  EC 81 MG tablet   atenolol  50 MG tablet Commonly known as: TENORMIN    baclofen  10 MG tablet Commonly known as: LIORESAL    calcium   carbonate 500 MG chewable tablet Commonly known as: TUMS - dosed in mg elemental calcium    doxepin 10 MG capsule Commonly known as: SINEQUAN   fluticasone 50 MCG/ACT nasal spray Commonly known as: FLONASE   potassium chloride  10 MEQ tablet Commonly known as: KLOR-CON    quinapril  20 MG tablet Commonly known as: ACCUPRIL    traMADol  50 MG tablet Commonly known as: ULTRAM        TAKE these medications    acetaminophen  500 MG tablet Commonly known as: TYLENOL  Take 500 mg by mouth every 6 (six) hours as needed for mild pain or moderate pain.   ALPRAZolam  0.5 MG tablet Commonly known as: XANAX  Take 0.5 mg by mouth 2 (two) times daily as needed for anxiety. What changed: Another medication with the same name was removed. Continue taking this medication, and follow the directions you see here.   carvedilol  6.25 MG tablet Commonly known as: COREG  Take 1 tablet (6.25 mg total) by mouth 2 (two) times daily.   CENTRUM PO Take 1 tablet by mouth daily.   cholecalciferol  1000 units tablet Commonly known as: VITAMIN D  Take 2,000 Units by mouth daily.   citalopram  20 MG tablet Commonly known as: CELEXA  Take 20 mg by mouth daily.   clonazePAM  0.5 MG tablet Commonly known as: KlonoPIN  Take 0.5 tablets (0.25 mg total) by mouth 2 (two) times daily as needed for up to 5 days for anxiety. Clonazepam  0.5 mg, 1/2 tab every AM; can take an extra 1/2 tab as needed for acute stress/panic feelings once daily   clopidogrel  75 MG tablet Commonly known as: PLAVIX  Take 1 tablet by mouth daily.   diphenoxylate-atropine 2.5-0.025 MG tablet Commonly known as: LOMOTIL Take by mouth 4 (four) times daily as needed for diarrhea or loose stools.   isosorbide  mononitrate 30 MG 24 hr tablet Commonly known as: IMDUR  Take 30 mg by mouth 2 (two) times daily.   lisinopril 20 MG tablet Commonly known as: ZESTRIL Take 20 mg by mouth daily.   nitroGLYCERIN  0.4 MG SL tablet Commonly known as:  NITROSTAT  Place 0.4 mg under the tongue every 5 (five) minutes as needed for chest pain.   oseltamivir  30 MG capsule Commonly known as: TAMIFLU  Take 1 capsule (30 mg total) by mouth 2 (two) times daily.   Prolia 60 MG/ML Sosy injection Generic drug: denosumab Inject into the skin.       Allergies[1]  Follow-up Information     Glover Lenis, MD Follow up.   Specialty: Family Medicine Why: one week Contact information: [address] [place] [address] [phone]                  The results of significant diagnostics from this hospitalization (including imaging, microbiology, ancillary and laboratory) are listed below for reference.    Significant Diagnostic Studies: CT ABDOMEN PELVIS W WO CONTRAST Result Date: 06/03/2024 EXAM: CT ABDOMEN AND  PELVIS WITH AND WITHOUT CONTRAST 06/03/2024 07:49:17 AM TECHNIQUE: CT of the abdomen and pelvis was performed with and without the administration of intravenous contrast. 100 mL of iohexol  (OMNIPAQUE ) 300 MG/ML solution was administered. Multiplanar reformatted images are provided for review. Automated exposure control, iterative reconstruction, and/or weight-based adjustment of the mA/kV was utilized to reduce the radiation dose to as low as reasonably achievable. COMPARISON: CT abdomen and pelvis 06/14/2007. CLINICAL HISTORY: 89 year old female with diarrhea, fever, recent cold-like symptoms, new onset dysuria, foul smelling urine, and a fall. FINDINGS: LOWER CHEST: Mild cardiomegaly. No pericardial effusion. Lung bases are well aerated. No pleural effusion. LIVER: Chronic postoperative changes to the right lobe of the liver are stable. GALLBLADDER AND BILE DUCTS: Chronic cholecystectomy. No biliary ductal dilatation. SPLEEN: No acute abnormality. PANCREAS: No acute abnormality. ADRENAL GLANDS: No acute abnormality. KIDNEYS, URETERS AND BLADDER: Symmetric renal enhancement and contrast excretion with no obstructive uropathy.  Multiple small benign appearing renal cysts with simple fluid density (no follow up imaging recommended). Decompressed ureters. Mildly distended but otherwise unremarkable urinary bladder. Calcified pelvic phleboliths but no urinary calculus identified. GI AND BOWEL: Small gastric hiatal hernia is new or increased since 2019. Otherwise decompressed stomach. Advanced diverticulosis throughout the sigmoid colon in the pelvis, no active inflammation. Moderate diverticulosis of the descending colon and splenic flexure, no active inflammation. Otherwise redundant large bowel with mild to moderate retained stool. Diminutive or absent appendix. No large bowel inflammation. No dilated small bowel. There is no bowel obstruction. PERITONEUM AND RETROPERITONEUM: No ascites. No free air. VASCULATURE: Chronic moderate to severe calcified atherosclerosis throughout the visible aorta, with moderate to severe stenosis at the aortoiliac bifurcation (series 12 image 24), but major arterial structures in the abdomen and pelvis including the bifurcation remain patent. Portal venous system is patent. LYMPH NODES: No lymphadenopathy. REPRODUCTIVE ORGANS: No acute abnormality. BONES AND SOFT TISSUES: L5 compression fracture with vertebra plana (series 11 image 72) is new since 2019, age indeterminate. Chronic T11 moderate to severe compression and near vertebra plana is stable since 2019 including mild retropulsion. No other acute osseous abnormality identified. Chronic pubic rami fractures were present in 2019. No focal soft tissue abnormality. IMPRESSION: 1. L5 compression fracture with vertebra plana, new since 2019 and age indeterminate. If there is low back pain and if specific therapy such as vertebroplasty is desired, MRI without contrast or a nuclear medicine whole-body bone scan would best determine acuity and candidacy for intervention. 2. No obstructive uropathy, acute or inflammatory process identified in the abdomen or  pelvis. 3. Moderate to severe calcified aortoiliac atherosclerosis, with stenosis at the bifurcation which remains patent. Electronically signed by: Helayne Hurst MD 06/03/2024 08:22 AM EST RP Workstation: [id number]   DG Chest Portable 1 View Result Date: 06/03/2024 EXAM: 1 VIEW(S) XRAY OF THE CHEST 06/03/2024 01:37:00 AM COMPARISON: 09/30/2023 CLINICAL HISTORY: cough FINDINGS: LUNGS AND PLEURA: Chronic coarsened interstitial markings without pulmonary edema. No focal pulmonary opacity. No pleural effusion. No pneumothorax. HEART AND MEDIASTINUM: Atherosclerotic calcifications. No acute abnormality of the cardiac and mediastinal silhouettes. BONES AND SOFT TISSUES: Severe compression deformity of lower thoracic spine. IMPRESSION: 1. No acute cardiopulmonary abnormality. 2. Chronic coarsened interstitial markings. Electronically signed by: Greig Pique MD 06/03/2024 01:50 AM EST RP Workstation: [id number]    Microbiology: Recent Results (from the past 240 hours)  Resp panel by RT-PCR (RSV, Flu A&B, Covid) Urine, Catheterized     Status: Abnormal   Collection Time: 06/03/24  1:16 AM   Specimen: Urine, Catheterized; Nasal Swab  Result Value Ref Range Status   SARS Coronavirus 2 by RT PCR NEGATIVE NEGATIVE Final    Comment: (NOTE) SARS-CoV-2 target nucleic acids are NOT DETECTED.  The SARS-CoV-2 RNA is generally detectable in upper respiratory specimens during the acute phase of infection. The lowest concentration of SARS-CoV-2 viral copies this assay can detect is 138 copies/mL. A negative result does not preclude SARS-Cov-2 infection and should not be used as the sole basis for treatment or other patient management decisions. A negative result may occur with  improper specimen collection/handling, submission of specimen other than nasopharyngeal swab, presence of viral mutation(s) within the areas targeted by this assay, and inadequate number of viral copies(<138 copies/mL). A negative result  must be combined with clinical observations, patient history, and epidemiological information. The expected result is Negative.  Fact Sheet for Patients:  bloggercourse.com  Fact Sheet for Healthcare Providers:  seriousbroker.it  This test is no t yet approved or cleared by the [place]  FDA and  has been authorized for detection and/or diagnosis of SARS-CoV-2 by FDA under an Emergency Use Authorization (EUA). This EUA will remain  in effect (meaning this test can be used) for the duration of the COVID-19 declaration under Section 564(b)(1) of the Act, 21 U.S.C.section 360bbb-3(b)(1), unless the authorization is terminated  or revoked sooner.       Influenza A by PCR POSITIVE (A) NEGATIVE Final   Influenza B by PCR NEGATIVE NEGATIVE Final    Comment: (NOTE) The Xpert Xpress SARS-CoV-2/FLU/RSV plus assay is intended as an aid in the diagnosis of influenza from Nasopharyngeal swab specimens and should not be used as a sole basis for treatment. Nasal washings and aspirates are unacceptable for Xpert Xpress SARS-CoV-2/FLU/RSV testing.  Fact Sheet for Patients: bloggercourse.com  Fact Sheet for Healthcare Providers: seriousbroker.it  This test is not yet approved or cleared by the [place]  FDA and has been authorized for detection and/or diagnosis of SARS-CoV-2 by FDA under an Emergency Use Authorization (EUA). This EUA will remain in effect (meaning this test can be used) for the duration of the COVID-19 declaration under Section 564(b)(1) of the Act, 21 U.S.C. section 360bbb-3(b)(1), unless the authorization is terminated or revoked.     Resp Syncytial Virus by PCR NEGATIVE NEGATIVE Final    Comment: (NOTE) Fact Sheet for Patients: bloggercourse.com  Fact Sheet for Healthcare Providers: seriousbroker.it  This test  is not yet approved or cleared by the [place]  FDA and has been authorized for detection and/or diagnosis of SARS-CoV-2 by FDA under an Emergency Use Authorization (EUA). This EUA will remain in effect (meaning this test can be used) for the duration of the COVID-19 declaration under Section 564(b)(1) of the Act, 21 U.S.C. section 360bbb-3(b)(1), unless the authorization is terminated or revoked.  Performed at [facility], [address] [place], [place] [address]   Blood culture (routine x 2)     Status: Abnormal   Collection Time: 06/03/24  1:16 AM   Specimen: BLOOD  Result Value Ref Range Status   Specimen Description   Final    BLOOD RIGHT ANTECUBITAL Performed at [facility], [address], [place], [place] [address]    Special Requests   Final    BOTTLES DRAWN AEROBIC AND ANAEROBIC Blood Culture adequate volume Performed at [facility], [address], [place], [place] [address]    Culture  Setup Time   Final    GRAM POSITIVE COCCI IN BOTH AEROBIC AND ANAEROBIC BOTTLES CRITICAL RESULT CALLED  TO, READ BACK BY AND VERIFIED WITH:  JASON ROBBINS AT 0040 06/04/24 JG    Culture (A)  Final    STAPHYLOCOCCUS EPIDERMIDIS THE SIGNIFICANCE OF ISOLATING THIS ORGANISM FROM A SINGLE SET OF BLOOD CULTURES WHEN MULTIPLE SETS ARE DRAWN IS UNCERTAIN. PLEASE NOTIFY THE MICROBIOLOGY DEPARTMENT WITHIN ONE WEEK IF SPECIATION AND SENSITIVITIES ARE REQUIRED. Performed at [facility], [address], [place], [place] [address]    Report Status 06/06/2024 FINAL  Final  Blood culture (routine x 2)     Status: None (Preliminary result)   Collection Time: 06/03/24  1:16 AM   Specimen: BLOOD  Result Value Ref Range Status   Specimen Description BLOOD LEFT ANTECUBITAL  Final   Special Requests   Final    BOTTLES DRAWN AEROBIC AND ANAEROBIC Blood Culture adequate volume   Culture   Final    NO GROWTH 3 DAYS Performed at [facility], [address] [place], [place] [address]    Report Status PENDING  Incomplete  Blood Culture ID Panel (Reflexed)     Status: Abnormal   Collection Time: 06/03/24  1:16 AM  Result Value Ref Range Status   Enterococcus faecalis NOT DETECTED NOT DETECTED Final   Enterococcus Faecium NOT DETECTED NOT DETECTED Final   Listeria monocytogenes NOT DETECTED NOT DETECTED Final   Staphylococcus species DETECTED (A) NOT DETECTED Final    Comment: CRITICAL RESULT CALLED TO, READ BACK BY AND VERIFIED WITH:  JASON ROBBINS AT 0040 06/04/24 JG    Staphylococcus aureus (BCID) NOT DETECTED NOT DETECTED Final   Staphylococcus epidermidis DETECTED (A) NOT DETECTED Final    Comment: CRITICAL RESULT CALLED TO, READ BACK BY AND VERIFIED WITH:  JASON ROBBINS AT 0040 06/04/24 JG    Staphylococcus lugdunensis NOT DETECTED NOT DETECTED Final   Streptococcus species NOT DETECTED NOT DETECTED Final   Streptococcus agalactiae NOT DETECTED NOT DETECTED Final   Streptococcus pneumoniae NOT DETECTED NOT DETECTED Final   Streptococcus pyogenes NOT DETECTED NOT DETECTED Final   A.calcoaceticus-baumannii NOT DETECTED NOT DETECTED Final   Bacteroides fragilis NOT DETECTED NOT DETECTED Final   Enterobacterales NOT DETECTED NOT DETECTED Final   Enterobacter cloacae complex NOT DETECTED NOT DETECTED Final   Escherichia coli NOT DETECTED NOT DETECTED Final   Klebsiella aerogenes NOT DETECTED NOT DETECTED Final   Klebsiella oxytoca NOT DETECTED NOT DETECTED Final   Klebsiella pneumoniae NOT DETECTED NOT DETECTED Final   Proteus species NOT DETECTED NOT DETECTED Final   Salmonella species NOT DETECTED NOT DETECTED Final   Serratia marcescens NOT DETECTED NOT DETECTED Final   Haemophilus influenzae NOT DETECTED NOT DETECTED Final   Neisseria meningitidis NOT DETECTED NOT DETECTED Final   Pseudomonas aeruginosa NOT DETECTED NOT DETECTED Final   Stenotrophomonas maltophilia NOT DETECTED NOT DETECTED Final   Candida albicans  NOT DETECTED NOT DETECTED Final   Candida auris NOT DETECTED NOT DETECTED Final   Candida glabrata NOT DETECTED NOT DETECTED Final   Candida krusei NOT DETECTED NOT DETECTED Final   Candida parapsilosis NOT DETECTED NOT DETECTED Final   Candida tropicalis NOT DETECTED NOT DETECTED Final   Cryptococcus neoformans/gattii NOT DETECTED NOT DETECTED Final   Methicillin resistance mecA/C NOT DETECTED NOT DETECTED Final    Comment: Performed at [facility], [address] [place], [place] [address]     Labs: Basic Metabolic Panel: Recent Labs  Lab 06/03/24 0116 06/03/24 0500 06/03/24 0537 06/04/24 1048 06/05/24 0415  NA 133* 136  --  136  136  K 3.2* 3.6  --  4.2 3.2*  CL 99 103  --  100 98  CO2 22 25  --  23 23  GLUCOSE 146* 108*  --  99 80  BUN 22 19  --  17 21  CREATININE 1.13* 1.01*  --  0.95 1.05*  CALCIUM  8.9 9.4  --  9.6 9.6  MG  --   --  2.0 1.9 1.9   Liver Function Tests: Recent Labs  Lab 06/03/24 0116  AST 17  ALT 13  ALKPHOS 59  BILITOT 0.2  PROT 5.3*  ALBUMIN 3.1*   No results for input(s): LIPASE, AMYLASE in the last 168 hours. No results for input(s): AMMONIA in the last 168 hours. CBC: Recent Labs  Lab 06/03/24 0116 06/03/24 0500 06/03/24 1133 06/03/24 1618 06/04/24 1159 06/05/24 0415  WBC 9.5 5.9  --   --  7.8 7.7  NEUTROABS 7.6  --   --   --   --   --   HGB 6.6* 11.5* 11.6* 11.9* 12.7 13.2  HCT 20.6* 34.9* 35.8* 37.0 37.5 38.9  MCV 105.1* 100.9*  --   --  96.4 94.6  PLT 228 174  --   --  169 177   Cardiac Enzymes: No results for input(s): CKTOTAL, CKMB, CKMBINDEX, TROPONINI in the last 168 hours. BNP: BNP (last 3 results) No results for input(s): BNP in the last 8760 hours.  ProBNP (last 3 results) Recent Labs    06/04/24 0705  PROBNP 4,200.0*    CBG: Recent Labs  Lab 06/04/24 0622 06/04/24 1445  GLUCAP 112* 122*       Signed:  Devaughn KATHEE Ban MD.  [facility] 06/06/2024, 9:59 AM     [1]   Allergies Allergen Reactions   Codeine Anaphylaxis and Other (See Comments)   Adhesive [Tape]    Diazepam Other (See Comments)    shakes   Ezetimibe Other (See Comments)    Myalgia

## 2024-06-06 NOTE — TOC Transition Note (Signed)
 Transition of Care [facility]) - Discharge Note   Patient Details  Name: Jane [name] MRN: [id number] Date of Birth: Oct 12, 1935  Transition of Care [facility]) CM/SW Contact:  Alfonso Rummer, LCSW Phone Number: 06/06/2024, 10:59 AM   Clinical Narrative:      Pt will discharge home with [facility]. No additional toc needs identified.    Barriers to Discharge: Continued Medical Work up   Patient Goals and CMS Choice Patient states their goals for this hospitalization and ongoing recovery are:: go back home          Discharge Placement                       Discharge Plan and Services Additional resources added to the After Visit Summary for                                       Social Drivers of Health (SDOH) Interventions SDOH Screenings   Food Insecurity: Patient Unable To Answer (06/05/2024)  Housing: Patient Unable To Answer (06/05/2024)  Transportation Needs: Patient Unable To Answer (06/05/2024)  Utilities: Patient Unable To Answer (06/05/2024)  Financial Resource Strain: Low Risk  (03/22/2024)   Received from [facility]  Social Connections: Unknown (06/05/2024)  Tobacco Use: Medium Risk (06/03/2024)     Readmission Risk Interventions     No data to display

## 2024-06-06 NOTE — Plan of Care (Signed)
  Problem: Clinical Measurements: Goal: Ability to maintain clinical measurements within normal limits will improve Outcome: Progressing Goal: Will remain free from infection Outcome: Progressing Goal: Diagnostic test results will improve Outcome: Progressing Goal: Respiratory complications will improve Outcome: Progressing   Problem: Nutrition: Goal: Adequate nutrition will be maintained Outcome: Progressing   Problem: Pain Managment: Goal: General experience of comfort will improve and/or be controlled Outcome: Progressing   Problem: Safety: Goal: Ability to remain free from injury will improve Outcome: Progressing   Problem: Skin Integrity: Goal: Risk for impaired skin integrity will decrease Outcome: Progressing

## 2024-06-06 NOTE — Discharge Instructions (Signed)
 [facility] will contact you to set up home health assessment [phone]

## 2024-06-08 LAB — CULTURE, BLOOD (ROUTINE X 2)
Culture: NO GROWTH
Special Requests: ADEQUATE
